# Patient Record
Sex: Male | Born: 1965 | Hispanic: No | Marital: Married | Smoking: Never smoker
Health system: Southern US, Community
[De-identification: ages and names within clinical notes are randomized; demographics above are authoritative.]

## PROBLEM LIST (undated history)

## (undated) DIAGNOSIS — I2510 Atherosclerotic heart disease of native coronary artery without angina pectoris: Secondary | ICD-10-CM

## (undated) DIAGNOSIS — K589 Irritable bowel syndrome without diarrhea: Secondary | ICD-10-CM

## (undated) DIAGNOSIS — Z8739 Personal history of other diseases of the musculoskeletal system and connective tissue: Secondary | ICD-10-CM

## (undated) DIAGNOSIS — K529 Noninfective gastroenteritis and colitis, unspecified: Secondary | ICD-10-CM

## (undated) DIAGNOSIS — A159 Respiratory tuberculosis unspecified: Secondary | ICD-10-CM

## (undated) DIAGNOSIS — F4310 Post-traumatic stress disorder, unspecified: Secondary | ICD-10-CM

## (undated) DIAGNOSIS — IMO0001 Reserved for inherently not codable concepts without codable children: Secondary | ICD-10-CM

## (undated) DIAGNOSIS — M79605 Pain in left leg: Secondary | ICD-10-CM

## (undated) DIAGNOSIS — M1990 Unspecified osteoarthritis, unspecified site: Secondary | ICD-10-CM

## (undated) DIAGNOSIS — Z973 Presence of spectacles and contact lenses: Secondary | ICD-10-CM

## (undated) DIAGNOSIS — G8929 Other chronic pain: Secondary | ICD-10-CM

## (undated) DIAGNOSIS — G43909 Migraine, unspecified, not intractable, without status migrainosus: Secondary | ICD-10-CM

## (undated) HISTORY — DX: Pain in left leg: M79.605

## (undated) HISTORY — PX: HX LAPAROTOMY: SHX154

## (undated) HISTORY — PX: HX HEART CATHETERIZATION: SHX148

## (undated) HISTORY — PX: PB COLONOSCOPY,BIOPSY: 45380

---

## 1995-12-29 ENCOUNTER — Emergency Department (HOSPITAL_COMMUNITY): Payer: Self-pay

## 2007-03-14 ENCOUNTER — Encounter (FREE_STANDING_LABORATORY_FACILITY)
Admit: 2007-03-14 | Discharge: 2007-03-14 | Disposition: A | Discharge status: Home / Self Care | Payer: Self-pay | Attending: Pulmonary Disease | Admitting: Pulmonary Disease

## 2007-12-22 ENCOUNTER — Emergency Department (EMERGENCY_DEPARTMENT_HOSPITAL): Payer: No Typology Code available for payment source

## 2007-12-22 ENCOUNTER — Observation Stay (HOSPITAL_BASED_OUTPATIENT_CLINIC_OR_DEPARTMENT_OTHER): Payer: No Typology Code available for payment source

## 2007-12-22 ENCOUNTER — Observation Stay
Admission: EM | Admit: 2007-12-22 | Discharge: 2007-12-26 | Disposition: A | Discharge status: Home / Self Care | Payer: No Typology Code available for payment source | Source: Emergency Department | Attending: Internal Medicine | Admitting: Internal Medicine

## 2007-12-22 ENCOUNTER — Encounter (HOSPITAL_BASED_OUTPATIENT_CLINIC_OR_DEPARTMENT_OTHER): Payer: No Typology Code available for payment source | Admitting: Internal Medicine

## 2007-12-22 ENCOUNTER — Encounter (HOSPITAL_COMMUNITY): Payer: Self-pay

## 2007-12-22 DIAGNOSIS — E785 Hyperlipidemia, unspecified: Secondary | ICD-10-CM | POA: Insufficient documentation

## 2007-12-22 DIAGNOSIS — R0789 Other chest pain: Principal | ICD-10-CM | POA: Insufficient documentation

## 2007-12-22 DIAGNOSIS — R079 Chest pain, unspecified: Secondary | ICD-10-CM

## 2007-12-22 HISTORY — DX: Respiratory tuberculosis unspecified: A15.9

## 2007-12-22 HISTORY — DX: Noninfective gastroenteritis and colitis, unspecified: K52.9

## 2007-12-22 LAB — CBC/DIFF
BASOPHILS: 0 % (ref 0–1)
BASOS ABS: 0.027 10*3/uL (ref 0.0–0.2)
EOS ABS: 0.079 THOU/uL — ABNORMAL LOW (ref 0.1–0.3)
EOSINOPHIL: 1 % (ref 1–6)
HCT: 42 % (ref 39.8–50.2)
HGB: 15 g/dL (ref 13.1–17.3)
LYMPHOCYTES: 50 % — ABNORMAL HIGH (ref 20–45)
LYMPHS ABS: 3.49 THOU/uL (ref 1.0–4.8)
MCH: 30.3 pg (ref 27.4–33.0)
MCHC: 35.7 g/dL — ABNORMAL HIGH (ref 31.6–35.5)
MCV: 84.8 fL (ref 78–100)
MONOCYTES: 6 % (ref 4–13)
MONOS ABS: 0.44 THOU/uL (ref 0.1–0.9)
MPV: 7.6 FL (ref 7.4–10.4)
PLATELET COUNT: 300 10*3/uL (ref 140–450)
PMN ABS: 3.07 10*3/uL (ref 1.3–7.7)
PMN'S: 43 % (ref 40–75)
RBC: 4.96 MIL/uL (ref 4.46–5.70)
RDW: 11.6 % (ref 11.5–14.5)
WBC: 7.1 THOU/UL (ref 3.5–11.0)

## 2007-12-22 LAB — BASIC METABOLIC PANEL
BUN/CREAT RATIO: 7 (ref 6–22)
CARBON DIOXIDE: 17 mmol/L — ABNORMAL LOW (ref 23–33)
GLUCOSE,NONFAST: 157 mg/dL — AB
SODIUM: 135 mmol/L — ABNORMAL LOW (ref 136–145)

## 2007-12-22 LAB — PT/INR
INR: 1.1 (ref 0.8–1.2)
PROTHROMBIN TIME: 10.8 s (ref 9.1–11.2)

## 2007-12-22 LAB — CREATINE KINASE (CK), TOTAL, SERUM OR PLASMA: CREATINE KINASE (CK): 167 U/L (ref 48–222)

## 2007-12-22 LAB — PTT (PARTIAL THROMBOPLASTIN TIME): APTT: 23.9 s (ref 22.5–32.0)

## 2007-12-22 LAB — CREATINE KINASE (CK), MB FRACTION, SERUM
CK-MB: 0.9 ng/mL (ref <6.4)
MB INDEX: 0.5 % (ref 0.0–5.0)

## 2007-12-22 MED ORDER — ASPIRIN 81 MG CHEWABLE TABLET
CHEWABLE_TABLET | ORAL | Status: AC
Start: 2007-12-22 — End: 2007-12-22
  Filled 2007-12-22: qty 4

## 2007-12-22 MED ORDER — PROMETHAZINE 25 MG TABLET
25.00 mg | ORAL_TABLET | Freq: Four times a day (QID) | ORAL | Status: DC | PRN
Start: 2007-12-22 — End: 2007-12-27
  Administered 2007-12-26: 25 mg via ORAL
  Filled 2007-12-22: qty 1

## 2007-12-22 MED ORDER — ASPIRIN CHEWABLE (81MG X 4) TABLETS - MI PROTOCOL
324.0000 mg | PACK | Freq: Once | Status: AC
Start: 2007-12-22 — End: 2007-12-22
  Administered 2007-12-22: 324 mg via ORAL
  Filled 2007-12-22: qty 1296

## 2007-12-22 MED ORDER — ACETAMINOPHEN 325 MG TABLET
650.00 mg | ORAL_TABLET | Freq: Four times a day (QID) | ORAL | Status: DC | PRN
Start: 2007-12-22 — End: 2007-12-27

## 2007-12-22 MED ORDER — NITROGLYCERIN 0.4 MG SUBLINGUAL TABLET
0.4000 mg | SUBLINGUAL_TABLET | SUBLINGUAL | Status: DC | PRN
Start: 2007-12-22 — End: 2007-12-27
  Administered 2007-12-25: 0.4 mg via SUBLINGUAL
  Filled 2007-12-22: qty 1

## 2007-12-22 MED ORDER — ALUMINUM-MAG HYDROXIDE-SIMETHICONE 400 MG-400 MG-40 MG/5 ML ORAL SUSP
5.00 mL | ORAL | Status: DC | PRN
Start: 2007-12-22 — End: 2007-12-27
  Administered 2007-12-23: 5 mL via ORAL
  Filled 2007-12-22 (×2): qty 5

## 2007-12-22 MED ORDER — IOVERSOL 320 MG IODINE/ML INTRAVENOUS SOLUTION
80.00 mL | INTRAVENOUS | Status: AC
Start: 2007-12-22 — End: 2007-12-22
  Administered 2007-12-22: 80 mL via INTRAVENOUS

## 2007-12-22 MED ORDER — MORPHINE 4 MG/ML INJECTION SYRINGE
4.0000 mg | INJECTION | Freq: Once | INTRAMUSCULAR | Status: AC
Start: 2007-12-22 — End: 2007-12-22
  Administered 2007-12-22: 4 mg via INTRAVENOUS

## 2007-12-22 MED ORDER — NITROGLYCERIN 0.4 MG SUBLINGUAL TABLET
0.40 mg | SUBLINGUAL_TABLET | SUBLINGUAL | Status: AC | PRN
Start: 2007-12-22 — End: 2007-12-22
  Administered 2007-12-22 (×3): 0.4 mg via SUBLINGUAL

## 2007-12-22 MED ORDER — ASPIRIN 325 MG TABLET,DELAYED RELEASE
325.0000 mg | DELAYED_RELEASE_TABLET | Freq: Every day | ORAL | Status: DC
Start: 2007-12-22 — End: 2007-12-27
  Administered 2007-12-22 – 2007-12-24 (×3): 325 mg via ORAL
  Administered 2007-12-25: 0 mg via ORAL
  Administered 2007-12-26: 325 mg via ORAL
  Filled 2007-12-22 (×5): qty 1

## 2007-12-22 MED ORDER — MORPHINE 4 MG/ML INJECTION SYRINGE
INJECTION | INTRAMUSCULAR | Status: AC
Start: 2007-12-22 — End: 2007-12-22
  Filled 2007-12-22: qty 1

## 2007-12-22 MED ORDER — MORPHINE 2 MG/ML INJECTION SYRINGE
2.0000 mg | INJECTION | INTRAMUSCULAR | Status: DC | PRN
Start: 2007-12-22 — End: 2007-12-27
  Administered 2007-12-22 – 2007-12-26 (×9): 2 mg via INTRAVENOUS
  Filled 2007-12-22 (×11): qty 1

## 2007-12-22 MED ORDER — NITROGLYCERIN 0.4 MG SUBLINGUAL TABLET
SUBLINGUAL_TABLET | SUBLINGUAL | Status: AC
Start: 2007-12-22 — End: 2007-12-22
  Filled 2007-12-22: qty 3

## 2007-12-22 MED ORDER — ASPIRIN 81 MG CHEWABLE TABLET
CHEWABLE_TABLET | ORAL | Status: AC
Start: 2007-12-22 — End: 2007-12-22
  Filled 2007-12-22: qty 1

## 2007-12-22 NOTE — ED Notes (Signed)
Bed: 19A<BR> Expected date: <BR> Expected time: <BR> Means of arrival: <BR> Comments:<BR>

## 2007-12-22 NOTE — ED Attending Handoff Note (Signed)
Care at 3:07 PM  Admitted for chest pain.

## 2007-12-22 NOTE — ED Provider Notes (Signed)
HPI        ROS        History:        Physical Exam        ED Course:    Results:     ED Course: Please see attending note for primary note.     Pt received mult ntg with only minimal improvemtn in pain. Pt received mult doses iv morphine for pain with recurrence after the medicine would wear off. Pt 's vital signs remainedstable throughout ed stay. Trop negative. Ekg - nsr, no acute changes in st segment or twave.  Cxr - no acute process. Medicine called for admission for rule out Mi. Ddimer neg.     Evaluation Plan:

## 2007-12-22 NOTE — ED Nurses Note (Signed)
 Patient states that he is feeling some relief after morphine - pain now a 4/10.

## 2007-12-22 NOTE — ED Nurses Note (Signed)
Patient report called to 7 [place] RN

## 2007-12-22 NOTE — ED Attending Note (Signed)
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  The chest is be in the center of the chest and 8 at template pain scale.  It is associated with palpitations and shortness of breath.  Patient denies any nausea.  Patient has no personal history of coronary disease although he does note history of Graves' disease in his uncles.  Cardiac risk factors include borderline diabetes, however the patient is not a smoker and denies any history of hypertension or hyperlipidemia.  Prior to this the patient has been is otherwise state of usual good health.  The patient does note the last time he ran 2 miles approximately 3 months ago and that he has fallen out of the habit of running.    Past medical history: Colitis, borderline diabetes mellitus  allergies: No known diagnosed drug allergies  medications: Nursing list reviewed  social history: The patient denies tobacco alcohol or drug use  family history the patient has no self or familial history of DVT or pulmonary embolus, there is coronary disease noted in his uncles.    PE :   VS on presentation: Blood pressure 124/80, pulse 130, resp. rate 32, height 1.778 m (5\' 10" ), weight 97.523 kg (215 lb), SpO2 98%.  General: Alert, oriented, mild respiratory distress  Skin: No acute lesions.  No petecchiae or purpura. Warm and flushed, mild diaphoresis  HEENT: AT/NC. PERRL. EOMI. B/L TMs WNL. OP clear.  Neck: No JVD or HJR. Supple. No meningismus. No adenopathy.  Lungs: CTA B/L. BS = B/L. No wheezes, rales, or rhonchi.  CV: Regular and tachycardic Normal S1/S2. No murmurs, rubs, gallops, or clicks.  Abdomen: +BS, soft, NT, ND.  No rebound or guarding present.  No masses.  Extrems: 2+ distal pulses of the upper and lower extremities.  Brisk capillary refill.  No deformities. Pink, warm, and dry.   Neuro: CNs II - XII fully intact.  Normal FTN and HTS.  Normal gait and cerebellar function.  Normal speech and comprehension.  2+/4 DTR's bilaterally.  Psych:  Nl affect and mood          Medical Decision Making/ED Course:   I have seen and examined this patient discuss this patient's care with Dr. Italy Albaugh, resident physician.  The patient's EKG was reviewed by me and shows a ventricular rate of 118 beats/min normal axis normal intervals and no ST segment abnormalities or other signs of ischemia.  I was present throughout this patient's history and physical and believe that the patient's symptoms today likely represents over exertion combined with possible component of anxiety.  The patient finished his intense exercise only 10 minutes prior to arrival here and appears to still be in cool off mode.  However given the patient's age he will be given aspirin as well as nitroglycerin during his initial assessment.  I believe the patient is low clinical suspicion for pulmonary embolus given his lack of risk factors, and only mild tachycardia with no hypoxia however d-dimer appears perforated for rule out.  The patient will be observed here in the emergency department to assure that his pulse returns to normal and he will be given IV fluids for rehydration.    Clinical Impression :   Chest pain   Tachycardia      Procedures: None

## 2007-12-23 DIAGNOSIS — R079 Chest pain, unspecified: Secondary | ICD-10-CM

## 2007-12-23 DIAGNOSIS — R7303 Prediabetes: Secondary | ICD-10-CM

## 2007-12-23 HISTORY — DX: Chest pain, unspecified: R07.9

## 2007-12-23 HISTORY — DX: Prediabetes: R73.03

## 2007-12-23 LAB — TROPONIN-I
TROPONIN-I: 0.015 ng/mL (ref <0.050)
TROPONIN-I: 0.011 ng/mL (ref <0.050)

## 2007-12-23 LAB — CREATINE KINASE (CK), MB FRACTION, SERUM
CK-MB: 1.1 ng/mL (ref <6.4)
MB INDEX: 0.5 % (ref 0.0–5.0)

## 2007-12-23 LAB — CREATININE
CREATININE: 0.94 mg/dL (ref 0.62–1.27)
ESTIMATED GLOMERULAR FILTRATION RATE: >59 mL/min/{1.73_m2} (ref >59)

## 2007-12-23 LAB — CREATINE KINASE (CK), TOTAL, SERUM OR PLASMA: CREATINE KINASE (CK): 208 U/L (ref 48–222)

## 2007-12-23 MED ORDER — TRAMADOL 50 MG TABLET
50.0000 mg | ORAL_TABLET | Freq: Four times a day (QID) | ORAL | Status: DC | PRN
Start: 2007-12-23 — End: 2007-12-27
  Administered 2007-12-23 – 2007-12-24 (×2): 50 mg via ORAL
  Filled 2007-12-23 (×7): qty 1

## 2007-12-23 MED ORDER — ESOMEPRAZOLE MAGNESIUM 40 MG CAPSULE,DELAYED RELEASE
40.0000 mg | DELAYED_RELEASE_CAPSULE | Freq: Every morning | ORAL | Status: DC
Start: 2007-12-23 — End: 2007-12-27
  Administered 2007-12-23: 40 mg via ORAL
  Administered 2007-12-24 – 2007-12-25 (×2): 0 mg via ORAL
  Administered 2007-12-26: 40 mg via ORAL
  Filled 2007-12-23 (×5): qty 1

## 2007-12-23 MED ORDER — SODIUM CHLORIDE 0.9 % INTRAVENOUS SOLUTION
INTRAVENOUS | Status: DC
Start: 2007-12-23 — End: 2007-12-27
  Administered 2007-12-25: 0 via INTRAVENOUS

## 2007-12-23 NOTE — Nurses Notes (Signed)
Pt assessed per flowsheet. C/o min amount of chest pain 4/10 ,and at times tingling to fingers to left hand. Will continiue to monitor.

## 2007-12-24 ENCOUNTER — Inpatient Hospital Stay (HOSPITAL_COMMUNITY): Payer: No Typology Code available for payment source

## 2007-12-24 ENCOUNTER — Observation Stay (HOSPITAL_BASED_OUTPATIENT_CLINIC_OR_DEPARTMENT_OTHER): Payer: No Typology Code available for payment source

## 2007-12-24 ENCOUNTER — Observation Stay (HOSPITAL_COMMUNITY): Payer: No Typology Code available for payment source

## 2007-12-24 LAB — BASIC METABOLIC PANEL
BUN/CREAT RATIO: 6 (ref 6–22)
BUN: 5 mg/dL — ABNORMAL LOW (ref 8–26)
GLUCOSE,NONFAST: 108 mg/dL — AB

## 2007-12-24 LAB — CBC/DIFF
BASOPHILS: 0 % (ref 0–1)
BASOS ABS: 0 THOU/uL (ref 0.0–0.2)
EOS ABS: 0.126 THOU/uL (ref 0.1–0.3)
EOSINOPHIL: 2 % (ref 1–6)
HCT: 37.5 % — ABNORMAL LOW (ref 39.8–50.2)
HGB: 13.3 g/dL (ref 13.1–17.3)
LYMPHOCYTES: 65 % — ABNORMAL HIGH (ref 20–45)
LYMPHS ABS: 4.095 THOU/uL (ref 1.0–4.8)
MCH: 30.1 pg (ref 27.4–33.0)
MCHC: 35.5 g/dL (ref 31.6–35.5)
MCV: 84.8 fL (ref 78–100)
MONOCYTES: 7 % (ref 4–13)
MONOS ABS: 0.441 THOU/uL (ref 0.1–0.9)
MPV: 7.3 FL — ABNORMAL LOW (ref 7.4–10.4)
PLATELET COUNT: 252 THO/UL (ref 140–450)
PMN ABS: 1.638 10*3/uL (ref 1.3–7.7)
PMN'S: 26 % — ABNORMAL LOW (ref 40–75)
RBC: 4.43 MIL/uL — ABNORMAL LOW (ref 4.46–5.70)
RDW: 11.6 % (ref 11.5–14.5)
WBC: 6.3 THOU/UL (ref 3.5–11.0)

## 2007-12-24 MED ORDER — DEXTROSE 5 % AND 0.45 % SODIUM CHLORIDE INTRAVENOUS SOLUTION
INTRAVENOUS | Status: DC
Start: 2007-12-25 — End: 2007-12-27

## 2007-12-24 MED ORDER — DIAZEPAM 5 MG TABLET
5.0000 mg | ORAL_TABLET | Freq: Once | ORAL | Status: AC
Start: 2007-12-25 — End: 2007-12-25
  Administered 2007-12-25: 5 mg via ORAL
  Filled 2007-12-24: qty 1

## 2007-12-25 ENCOUNTER — Inpatient Hospital Stay (HOSPITAL_COMMUNITY): Payer: No Typology Code available for payment source

## 2007-12-25 LAB — CBC/DIFF
BASOPHILS: 0 % (ref 0–1)
BASOS ABS: 0 THOU/uL (ref 0.0–0.2)
EOS ABS: 0.248 THOU/uL (ref 0.1–0.3)
EOSINOPHIL: 4 % (ref 1–6)
HCT: 38.8 % — ABNORMAL LOW (ref 39.8–50.2)
HGB: 13.2 g/dL (ref 13.1–17.3)
LYMPHOCYTES: 40 % (ref 20–45)
LYMPHS ABS: 2.48 THOU/uL (ref 1.0–4.8)
MCH: 28.9 pg (ref 27.4–33.0)
MCHC: 34.1 g/dL (ref 31.6–35.5)
MCV: 85 fL (ref 78–100)
MONOCYTES: 5 % (ref 4–13)
MONOS ABS: 0.31 THOU/uL (ref 0.1–0.9)
MPV: 7.7 FL (ref 7.4–10.4)
PLATELET COUNT: 234 THO/UL (ref 140–450)
PMN ABS: 3.162 THOU/uL (ref 1.3–7.7)
PMN'S: 51 % (ref 40–75)
RBC: 4.56 MIL/uL (ref 4.46–5.70)
RDW: 11.6 % (ref 11.5–14.5)
WBC: 6.2 THOU/UL (ref 3.5–11.0)

## 2007-12-25 LAB — LIPID PANEL
CHOLESTEROL: 180 mg/dL (ref <200)
HDL-CHOLESTEROL: 24 mg/dL — ABNORMAL LOW (ref >39)
LDL (CALCULATED): 97 mg/dL (ref <100)
TRIGLYCERIDES: 297 mg/dL — ABNORMAL HIGH (ref <150)

## 2007-12-25 LAB — PT/INR
INR: 1 (ref 0.8–1.2)
PROTHROMBIN TIME: 10.4 s (ref 9.1–11.2)

## 2007-12-25 MED ORDER — FENTANYL (PF) 50 MCG/ML INJECTION SOLUTION
INTRAMUSCULAR | Status: AC
Start: 2007-12-25 — End: 2007-12-25
  Filled 2007-12-25: qty 1

## 2007-12-25 MED ORDER — MIDAZOLAM 1 MG/ML INJECTION SOLUTION
1.00 mg | INTRAMUSCULAR | Status: DC
Start: 2007-12-25 — End: 2007-12-25
  Administered 2007-12-25: 2 mg via INTRAVENOUS

## 2007-12-25 MED ORDER — SODIUM CHLORIDE 0.9 % INTRAVENOUS SOLUTION
INTRAVENOUS | Status: AC
Start: 2007-12-25 — End: 2007-12-25

## 2007-12-25 MED ORDER — FENTANYL (PF) 50 MCG/ML INJECTION SOLUTION
25.00 ug | INTRAMUSCULAR | Status: DC
Start: 2007-12-25 — End: 2007-12-25
  Administered 2007-12-25: 14:00:00 50 ug via INTRAVENOUS

## 2007-12-25 MED ORDER — MIDAZOLAM 1 MG/ML INJECTION SOLUTION
INTRAMUSCULAR | Status: AC
Start: 2007-12-25 — End: 2007-12-25
  Filled 2007-12-25: qty 4

## 2007-12-25 MED ORDER — HEPARIN (PORCINE) 1,000 UNIT/ML INJECTION SOLUTION
INTRAMUSCULAR | Status: AC
Start: 2007-12-25 — End: 2007-12-25
  Filled 2007-12-25: qty 2

## 2007-12-25 MED ORDER — LIDOCAINE HCL 20 MG/ML (2 %) INJECTION SOLUTION
10.0000 mL | Freq: Once | INTRAMUSCULAR | Status: AC
Start: 2007-12-25 — End: 2007-12-25
  Administered 2007-12-25: 200 mg via INTRADERMAL

## 2007-12-25 MED ORDER — IODIXANOL 320 MG IODINE/ML INTRAVENOUS SOLUTION
95.00 mL | INTRAVENOUS | Status: AC
Start: 2007-12-25 — End: 2007-12-25
  Administered 2007-12-25: 125 mL via INTRAVENOUS

## 2007-12-25 NOTE — Nurses Notes (Signed)
Pt to room via bed.R groin dressing CDI PPP sr up x 2

## 2007-12-25 NOTE — Nurses Notes (Signed)
1515-floor called for transport.  1545-floor called for transport. Report to nurse.

## 2007-12-25 NOTE — Nurses Notes (Signed)
1615-floor notified that Pt still not transported to floor.

## 2007-12-25 NOTE — Nurses Notes (Signed)
Pt c/o left chest pain 0.4mg  Nitroglycerin given sl, bp 126/88, pulse 62,  5 minutes after med pt states chest pain is "better" refuses further pain meds or nitroglycerin, will continue to monitor

## 2007-12-25 NOTE — Nurses Notes (Signed)
Pt to [facility] to await transport back to room. AAO x 3 ,vswnl,R groin dressing CDI,PPP . Post instr. To Pt and wife at bedside. SR up x 2

## 2007-12-25 NOTE — Nurses Notes (Signed)
Pt complains of tingling on toes. Dr. Alessandra Bevels contacted. No new orders given. Will continue to monitor.

## 2007-12-26 DIAGNOSIS — E785 Hyperlipidemia, unspecified: Secondary | ICD-10-CM

## 2007-12-26 HISTORY — DX: Hyperlipidemia, unspecified: E78.5

## 2007-12-26 MED ORDER — SIMVASTATIN 20 MG TABLET
20.00 mg | ORAL_TABLET | Freq: Every evening | ORAL | Status: DC
Start: 2007-12-26 — End: 2007-12-27
  Filled 2007-12-26: qty 1

## 2007-12-26 MED ORDER — DOCUSATE SODIUM 100 MG CAPSULE
100.00 mg | ORAL_CAPSULE | Freq: Two times a day (BID) | ORAL | Status: DC
Start: 2007-12-26 — End: 2007-12-27
  Administered 2007-12-26: 100 mg via ORAL
  Filled 2007-12-26 (×2): qty 1

## 2007-12-26 MED ORDER — NAPROXEN 250 MG TABLET
250.0000 mg | ORAL_TABLET | Freq: Three times a day (TID) | ORAL | Status: DC | PRN
Start: 2007-12-26 — End: 2007-12-27
  Filled 2007-12-26: qty 1

## 2007-12-26 MED ORDER — SIMVASTATIN 20 MG TABLET
20.00 mg | ORAL_TABLET | Freq: Every evening | ORAL | Status: DC
Start: 2007-12-26 — End: 2010-06-03

## 2007-12-26 MED ORDER — ESOMEPRAZOLE MAGNESIUM 40 MG CAPSULE,DELAYED RELEASE
40.00 mg | DELAYED_RELEASE_CAPSULE | Freq: Every morning | ORAL | Status: DC
Start: 2007-12-26 — End: 2010-06-03

## 2007-12-26 MED ORDER — NAPROXEN 250 MG TABLET
250.00 mg | ORAL_TABLET | Freq: Three times a day (TID) | ORAL | Status: DC | PRN
Start: 2007-12-26 — End: 2010-09-25

## 2007-12-26 MED ORDER — DOCUSATE SODIUM 100 MG CAPSULE
100.0000 mg | ORAL_CAPSULE | Freq: Two times a day (BID) | ORAL | Status: DC
Start: 2007-12-26 — End: 2007-12-26

## 2007-12-26 NOTE — Discharge Instructions (Addendum)
Pt to follow low fat diabetic diet.  Pt may remove dressing in 24hrs  See medication information handouts.  See Non-Cardiac Chest Pain handoout.

## 2007-12-26 NOTE — Discharge Summary (Signed)
[facility]   DEPARTMENT OF MEDICINE    DISCHARGE SUMMARY    PATIENT NAME: Spencer [name], Spencer [name]  HOSPITAL ZOXWRU:[id number]  DATE OF BIRTH: 12-21-1965    ADMISSION DATE:12/22/2007  DISCHARGE DATE:12/26/2007    FOLLOWUP AND PRIMARY CARE PHYSICIAN: Bunnie Domino MD.    DISCHARGE DIAGNOSES:  1. Atypical chest pain.  2. Hyperlipidemia.  3. Borderline diabetes mellitus.    CODE STATUS: Full.    DISCHARGE MEDICATIONS:  1. Simvastatin 20 mg 1 tablet p.o. nightly.  2. Ultram 50 mg 1 tablet p.o. q.6 hours p.r.n. for pain.    DISCHARGE INSTRUCTIONS:  A. Disposition: The patient is to follow up with his primary care physician in approximately 1-2 weeks to follow up with hyperlipidemia, atypical chest pain and his borderline diabetes mellitus.   B. Activity: Routine.  C. Diet: Regular.    REASON FOR HOSPITALIZATION: This is a 42 year old male who complains of a 2-day history of chest pain of the left chest wall.     HOSPITAL COURSE: Upon admission to [facility], the patient was worked up for routine chest pain including CBC, electrolytes, BUN, creatinine, cardiac enzymes, EKG and chest x-ray, cardiac enzymes did not indicate any acute cardiac disease at that time. EKG at that time also was not indicative of any acute myocardial infarction or ischemia. Chest x-ray did not indicate any specific findings at that time. The patient was sent for myocardial perfusion scan. At that time, it indicated that the study was actually limited but probably normal exam due to patient motion. There was a small stress-induced ischemia in the anterior wall, but could not be completely excluded. Overall, that was unremarkable cardiac function analysis with a calculated left ventricular ejection fraction of 65%. At that time, cardiology was consulted for the small area of ischemia in the anterior wall, this could not be excluded. The patient had consented to a cardiac catheterization, knew the risks, benefits and alternatives. Cardiac catheterization was performed in the afternoon of December 24, 2006. At that time indicated there was a normal cardiac function without any specific stenosis. The patient tolerated the procedure well. On December 26, 2007, the patient was discharged. The patient did not complain of any significant chest pain, shortness of breath, nausea, vomiting or diarrhea at that time. The patient is to follow up with his primary care physician for atypical chest pain, hyperlipidemia and his borderline diabetes. He will be started on simvastatin 20 mg 1 tablet p.o. nightly and also Ultram 50 mg 1 tablet p.o. q.6 hours p.r.n. for pain.    CONDITION ON DISCHARGE:  A. Ambulation: Independent.  B. Self-care ability: Full.  C. Cognitive status: He is alert and oriented x3.      Elinor Dodge, MD  Resident/[facility]    Bunnie Domino, MD  Assistant Professor  [facility]     [id number]/[id number];D: 12/26/2007 10:07:21; T: 12/26/2007 21:35:06    cc: Bunnie Domino MD   Shirleen Schirmer

## 2007-12-26 NOTE — Nurses Notes (Signed)
Pt given D/C information with follow up material and perscriptions.  Periphial IVs removed catheters intact.

## 2007-12-26 NOTE — Procedures (Signed)
[facility]   SECTION OF CARDIOLOGY   CARDIAC DIAGNOSTIC LABORATORIES   [phone]    Department of Medicine      School of Medicine  Section of Cardiology      Medical Center     CARDIAC CATHETERIZATION PROCEDURE REPORT    PATIENT NAME: Spencer [name], Spencer [name]   HOSPITAL ZOXWRU:[id number]  DATE OF BIRTH: [birth year]  PROCEDURE DATE: 12/25/2007    NAME OF PROCEDURES:  1. Left heart catheterization.  2. Selective coronary angiography.  3. Left ventricular angiogram.  4. Angio-Seal closure to the right common femoral artery.    INDICATIONS FOR PROCEDURE: The patient is a 42 year old man with a history of borderline diabetes who presented to the hospital with the complaint of chest pain. The chest pain lasted approximately 15 minutes. At the first sensation of chest pain, he underwent a myocardial perfusion scan which was compromised by motion artifact on stress. There was questionable anterior and inferior ischemia. He did have symptoms of chest pain on the treadmill. He had ruled out for MI and the rest of his blood work was unremarkable. Due to the patient having an equivocal myocardial perfusion scan, he was referred for left heart catheterization. His cardiac risk factors for coronary artery disease are borderline diabetic, family history of premature coronary artery disease.     DESCRIPTION OF PROCEDURE: After informed consent was obtained, the patient was brought to the cardiac catheterization laboratory where he was prepped and draped in the usual sterile fashion. Incremental doses of both Versed and fentanyl were used to achieve adequate levels of patient sedation. For local anesthesia, approximately 15 mL of 2% lidocaine were infiltrated into the right inguinal area above the right common femoral artery. Using the modified Seldinger technique, a 6-French femoral arterial sheath was introduced into the right common femoral artery. Under direct fluoroscopic guidance, a 6-French JL4 diagnostic coronary catheter was advanced to the level of the left main coronary ostium. The left coronary anatomy was then viewed in multiple angles. This catheter was exchanged over a wire for a 6-French JR4 diagnostic coronary catheter which was advanced under direct fluoroscopic guidance up to the level of the right coronary ostium. The right coronary anatomy was then viewed in multiple angles. This catheter was exchanged over a wire for a 6-French pigtail catheter which was advanced under direct fluoroscopic guidance up to the level of the left ventricle. A left ventricular angiogram was performed using 30 mL of contrast at 10 mL per second at 600 psi. The 6-French pigtail catheter was used for adequate pressure measurements in the left ventricle and upon pullback across the aortic valve. The patient tolerated the procedure well with no complications. He received a total of 110 mL of Optiray dye under 3.1 minutes of total fluoroscopy time. He received intracoronary nitroglycerin during the procedure, 150 mcg. He underwent a femoral angiogram and was deemed an appropriate candidate for Angio-Seal closure.    FINDINGS:    HEMODYNAMICS: Left ventricular end-diastolic pressure was approximately 18-20 mmHg. There was no gradient upon pullback across the aortic valve.     LEFT VENTRICULAR FUNCTION STUDY: The left ventricular angiogram displayed an ejection fraction of approximately 60% with no wall motion abnormalities and no mitral regurgitation noted.    CORONARY ANGIOGRAPHY    LEFT MAIN CORONARY ARTERY: Was normal.    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 Diagonal #1 was small and normal. Diagonal #2 was normal.    LEFT CIRCUMFLEX CORONARY ARTERY: Bifurcates and there was a large obtuse marginal branch #1 which was normal. There was a small obtuse marginal branch which was normal.    RIGHT CORONARY ARTERY: A large, dominant tortuous vessel and was normal.    IMPRESSION:  1. Essentially normal coronary angiogram as above.  2. Normal ventricular systolic function.    RECOMMENDATIONS:  1. Continue aggressive risk factor modification.  2. Noncardiac chest pain.  3. False positive myocardial perfusion scan.  4. Follow up with his primary care physician, Dr. Janeece Fitting in [place], [place], in 1-2 weeks.      Jodi Geralds, MD  Fellow  [facility] Department of Medicine, Section of Cardiology    Carmie End, MD  Assistant Professor, Section of Cardiology  [facility] Department of Medicine    WU/[id number]; D: 12/25/2007 16:33:54; T: 12/26/2007 12:48:35    cc: [name] DO   [facility] [address]   [place], [place] [address]      Bunnie Domino MD   Shirleen Schirmer

## 2007-12-30 DIAGNOSIS — I639 Cerebral infarction, unspecified: Secondary | ICD-10-CM

## 2007-12-30 HISTORY — DX: Cerebral infarction, unspecified (CMS HCC): I63.9

## 2008-05-19 ENCOUNTER — Ambulatory Visit (INDEPENDENT_AMBULATORY_CARE_PROVIDER_SITE_OTHER): Payer: No Typology Code available for payment source | Admitting: Neurology

## 2008-05-19 ENCOUNTER — Ambulatory Visit
Admission: RE | Admit: 2008-05-19 | Discharge: 2008-05-19 | Disposition: A | Discharge status: Home / Self Care | Payer: No Typology Code available for payment source | Attending: Neurology | Admitting: Neurology

## 2008-05-19 ENCOUNTER — Other Ambulatory Visit (INDEPENDENT_AMBULATORY_CARE_PROVIDER_SITE_OTHER): Payer: Self-pay | Admitting: Neurology

## 2008-05-19 DIAGNOSIS — R404 Transient alteration of awareness: Secondary | ICD-10-CM

## 2008-05-19 DIAGNOSIS — IMO0001 Reserved for inherently not codable concepts without codable children: Secondary | ICD-10-CM

## 2008-05-19 DIAGNOSIS — R569 Unspecified convulsions: Secondary | ICD-10-CM | POA: Insufficient documentation

## 2008-05-19 DIAGNOSIS — R079 Chest pain, unspecified: Secondary | ICD-10-CM

## 2008-05-19 DIAGNOSIS — G43909 Migraine, unspecified, not intractable, without status migrainosus: Secondary | ICD-10-CM

## 2008-05-19 DIAGNOSIS — R51 Headache: Secondary | ICD-10-CM

## 2008-05-20 ENCOUNTER — Other Ambulatory Visit (INDEPENDENT_AMBULATORY_CARE_PROVIDER_SITE_OTHER): Payer: Self-pay | Admitting: Neurology

## 2008-05-21 NOTE — Procedures (Signed)
[facility]                                ELECTROENCEPHALOGRAM REPORT                                EEG\EMG Scheduling [phone]                                 STATUS: [place]      NAMEMONTAGUE, CORELLA   UJWJ#:[id number]  DATE: 05/19/2008  DOB :  1966/02/12  SEX:M                  Technician:  BB.  EEG#:  [id number].    IMPRESSION:  This is a normal awake and asleep EEG.      REPORT:  This is an outpatient digital EEG obtained following the standard 10-20 system of electrode placement.  The study lasted for a total of 61 minutes.  The best background seen during the study was 10 Hz, which was symmetric on both sides and was responsive to eye opening.  Both awake and asleep states were recorded.  Sleep was noted by presence of sleep spindles and vertex waves.  No epileptiform abnormalities were seen.  There were no electrographic seizures.  Photic stimulation and hyperventilation both were performed and did not reveal any abnormalities.     INTERPRETATION:  The above described EEG is normal during the state of wakefulness and sleep.      Rut Chinita Pester, MD  Resident  [facility] Department of Neurology    Lurlean Nanny, MD  Assistant Professor  [facility] Department of Neurology    I have reviewed the EEG and agree with the findings of this report. Any additions, exceptions or clarifications are noted above.      [id number]/HYQ/[id number]; D: 05/20/2008 17:58:19; T: 05/21/2008 00:27:19

## 2008-06-16 ENCOUNTER — Encounter (HOSPITAL_COMMUNITY): Payer: Self-pay | Admitting: Neurology

## 2008-06-16 ENCOUNTER — Ambulatory Visit (INDEPENDENT_AMBULATORY_CARE_PROVIDER_SITE_OTHER)
Admission: RE | Admit: 2008-06-16 | Discharge: 2008-06-16 | Disposition: A | Discharge status: Home / Self Care | Source: Ambulatory Visit | Attending: Neurology | Admitting: Neurology

## 2008-06-16 ENCOUNTER — Encounter (HOSPITAL_COMMUNITY): Admitting: Neurology

## 2008-06-16 ENCOUNTER — Ambulatory Visit (HOSPITAL_BASED_OUTPATIENT_CLINIC_OR_DEPARTMENT_OTHER)
Admission: RE | Admit: 2008-06-16 | Discharge: 2008-06-16 | Disposition: A | Discharge status: Home / Self Care | Source: Ambulatory Visit | Attending: Neurology | Admitting: Neurology

## 2008-06-16 ENCOUNTER — Inpatient Hospital Stay
Admission: RE | Admit: 2008-06-16 | Discharge: 2008-06-19 | DRG: 563 | Disposition: A | Discharge status: Home / Self Care | Attending: Neurology | Admitting: Neurology

## 2008-06-16 DIAGNOSIS — R259 Unspecified abnormal involuntary movements: Secondary | ICD-10-CM

## 2008-06-16 DIAGNOSIS — R404 Transient alteration of awareness: Secondary | ICD-10-CM

## 2008-06-16 DIAGNOSIS — R51 Headache: Secondary | ICD-10-CM

## 2008-06-16 DIAGNOSIS — E785 Hyperlipidemia, unspecified: Secondary | ICD-10-CM | POA: Diagnosis present

## 2008-06-16 DIAGNOSIS — IMO0001 Reserved for inherently not codable concepts without codable children: Secondary | ICD-10-CM

## 2008-06-16 DIAGNOSIS — I10 Essential (primary) hypertension: Secondary | ICD-10-CM | POA: Diagnosis present

## 2008-06-16 DIAGNOSIS — R569 Unspecified convulsions: Principal | ICD-10-CM | POA: Diagnosis present

## 2008-06-16 HISTORY — DX: Reserved for inherently not codable concepts without codable children: IMO0001

## 2008-06-16 MED ORDER — SENNOSIDES 8.6 MG-DOCUSATE SODIUM 50 MG TABLET
1.0000 | ORAL_TABLET | Freq: Every evening | ORAL | Status: DC | PRN
Start: 2008-06-16 — End: 2008-06-20
  Filled 2008-06-16 (×4): qty 1

## 2008-06-16 MED ORDER — SIMVASTATIN 20 MG TABLET
20.00 mg | ORAL_TABLET | Freq: Every evening | ORAL | Status: DC
Start: 2008-06-16 — End: 2008-06-20
  Administered 2008-06-16 – 2008-06-18 (×3): 20 mg via ORAL
  Filled 2008-06-16 (×4): qty 1

## 2008-06-16 MED ORDER — ESOMEPRAZOLE MAGNESIUM 40 MG CAPSULE,DELAYED RELEASE
40.00 mg | DELAYED_RELEASE_CAPSULE | Freq: Every morning | ORAL | Status: DC
Start: 2008-06-16 — End: 2008-06-20
  Administered 2008-06-16: 0 mg via ORAL
  Administered 2008-06-17 – 2008-06-19 (×3): 40 mg via ORAL
  Filled 2008-06-16 (×4): qty 1

## 2008-06-16 MED ORDER — SODIUM CHLORIDE 0.9 % (FLUSH) INJECTION SYRINGE
0.5000 mL | INJECTION | Freq: Three times a day (TID) | INTRAMUSCULAR | Status: DC
Start: 2008-06-16 — End: 2008-06-20
  Administered 2008-06-16 – 2008-06-19 (×9): 0.5 mL via INTRAVENOUS

## 2008-06-16 MED ORDER — NAPROXEN 250 MG TABLET
250.00 mg | ORAL_TABLET | Freq: Three times a day (TID) | ORAL | Status: DC | PRN
Start: 2008-06-16 — End: 2008-06-20
  Filled 2008-06-16 (×4): qty 1

## 2008-06-16 MED ORDER — GADOBENATE DIMEGLUMINE 529 MG/ML(0.1 MMOL/0.2 ML) INTRAVENOUS SOLUTION
20.00 mL | INTRAVENOUS | Status: AC
Start: 2008-06-16 — End: 2008-06-16
  Administered 2008-06-16: 20 mL via INTRAVENOUS

## 2008-06-16 MED ORDER — IOVERSOL 350 MG IODINE/ML INTRAVENOUS SOLUTION
80.00 mL | INTRAVENOUS | Status: AC
Start: 2008-06-16 — End: 2008-06-16
  Administered 2008-06-16: 80 mL via INTRAVENOUS

## 2008-06-16 NOTE — Nurses Notes (Signed)
 Pt arrived ambulatory from Radiology to Rm. 968 to be observed via video EEG monitoring. His only event was observed 3 months ago. He had an esophgeal manometry catheter placed and went home. Lying on the couch with his wife, she woke up as she observed him being asleep but his body was shaking for 8-10 seconds. She awakened him who was unaware he had been shaking and then he just went back to sleep. He also blacks out and will profusely sweat when having a bm. Has 86yr hx of migraines.

## 2008-06-17 DIAGNOSIS — R569 Unspecified convulsions: Secondary | ICD-10-CM

## 2008-06-17 MED ORDER — ACETAMINOPHEN 325 MG TABLET
650.0000 mg | ORAL_TABLET | ORAL | Status: DC | PRN
Start: 2008-06-17 — End: 2008-06-20
  Administered 2008-06-18 – 2008-06-19 (×2): 650 mg via ORAL
  Filled 2008-06-17 (×2): qty 2

## 2008-06-18 DIAGNOSIS — R51 Headache: Secondary | ICD-10-CM

## 2008-06-18 NOTE — Procedures (Signed)
[facility]                                ELECTROENCEPHALOGRAM REPORT                                EEG\EMG Scheduling [phone]                                 STATUS: I      NAMESHARRON, Spencer [name]   UYQI#:[id number]  DATE: 06/16/2008  DOB :  1966/07/21  SEX:M                  Video EEG#:  [id number], day #1   REQUESTING PHYSICIAN:  Lurlean Nanny MD.    INDICATION:  staring spells    DESCRIPTION:  A 24-hour video EEG recording with digital analysis was performed in this 42 year old male.  Low amplitude, high frequency background was noted throughout the recording with a rhythm of 10 HZ. Awake stage as well as all stages of sleep were identified including vertex waves, sleep spindles and K complexes.  No spike and wave abnormalities were detected and no pushbutton events occurred.    IMPRESSION:  This is a normal 24-hour video EEG recording with digital analysis performed.      Uvaldo Bristle, MD  Resident  [facility] of Medicine    Lurlean Nanny, MD  Assistant Professor  [facility] Department of Neurology    I have reviewed the EEG and agree with the findings of this report. Any additions, exceptions or clarifications are noted above.      EP/[id number]/[id number]; D: 06/18/2008 08:51:27; T: 06/18/2008 15:13:15    cc: Lurlean Nanny MD      Shirleen Schirmer

## 2008-06-18 NOTE — Nurses Notes (Signed)
Pt sleeping at 0400.  Appears comfortable.  No vitals taken nor assessment performed per kardex order.

## 2008-06-18 NOTE — Procedures (Signed)
[facility]                                ELECTROENCEPHALOGRAM REPORT                                EEG\EMG Scheduling [phone]                                 STATUS: I      NAMEEDOUARD, Spencer [name]   UUVO#:[id number]  DATE: 06/17/2008  DOB :  16-May-1966  SEX:M                  Technician:    Video EEG #:  [id number] day #2      REQUESTING PHYSICIAN:  Lurlean Nanny MD.    INDICATIONS:  Staring spells.    DESCRIPTION:  This is a 24-hour video EEG recording day 2 with a background rhythm of 10 Hz.  Sleep as well as awake state was identified.  All stages of sleep were identified.  Low-amplitude and high-frequency waves are noted.  No epileptiform discharges were identified.  No spike and wave discharges were noted.  No pushbutton events occurred during this recording.    IMPRESSION:  This is a normal 24-hour video EEG recording with digital analysis.  No abnormalities were detected.      Uvaldo Bristle, MD  Resident  [facility] of Medicine    Lurlean Nanny, MD  Assistant Professor  [facility] Department of Neurology    I have reviewed the EEG and agree with the findings of this report. Any additions, exceptions or clarifications are noted above.      [id number]; D: 06/18/2008 51:88:41; T: 06/18/2008 22:34:00

## 2008-06-19 MED ORDER — METOPROLOL TARTRATE 25 MG TABLET
25.0000 mg | ORAL_TABLET | Freq: Two times a day (BID) | ORAL | Status: DC
Start: 2008-06-19 — End: 2008-06-20
  Filled 2008-06-19 (×2): qty 1

## 2008-06-19 NOTE — Nurses Notes (Signed)
Patient sleeping at this time. No VS or nursing assessment completed per orders.

## 2008-06-19 NOTE — Procedures (Signed)
[facility]                                ELECTROENCEPHALOGRAM REPORT                                EEG\EMG Scheduling [phone]                                 STATUS: I      NAMECLEE, PANDIT   KGMW#:[id number]  DATE: 06/19/2008  DOB :  04/14/1966  SEX:M                  Technician:   Video EEG #:  [id number].  This is day #3.    Total recording time for this vEEG was from June 16, 2008, at 4:15 p.m. to June 19, 2008, at 12:23 p.m.    INTERICTAL ABNORMALITIES:  No interictal abnormalities were seen.  The background rhythms remained the same at 10-11 Hz activity seen on both sides of the head.  Normal sleep architecture seen throughout the record.  Digital analysis for epileptiform spikes detection was performed but no abnormalities were seen.     CLINICAL EVENTS:  No pushbutton events.    No clinical events and no EEG abnormalities to suggest ongoing seizures or subclinical seizures were seen.    INTERPRETATION:  This is a normal video EEG.      Lurlean Nanny, MD  Assistant Professor  [facility] Department of Neurology    YQ/[id number]; D: 06/19/2008 18:03:10; T: 06/19/2008 18:17:52

## 2008-06-19 NOTE — Nurses Notes (Signed)
Patient sleeping at this time. No VS or nursing assessment performed per orders. Will monitor patient status via video.

## 2008-06-20 NOTE — Discharge Summary (Signed)
[facility]                                DEPARTMENT OF NEUROLOGY                                     DISCHARGE SUMMARY    PATIENT NAME: Spencer [name], Spencer [name]  HOSPITAL HYQMVH:[id number]  DATE OF BIRTH: [birth year]    ADMISSION DATE:06/16/2008  DISCHARGE DATE:06/19/2008    FOLLOWUP PHYSICIAN:  Lurlean Nanny MD.    DISCHARGE DIAGNOSES:  1.  Spells of unclear etiology.  2.  Headaches.  3.  Lower back pain.  4.  Hyperlipidemia.  5.  Reflux.  6.  Hypertension.    DISCHARGE MEDICATIONS:  1.  Nexium 40 mg daily.  2.  Zocor daily.  3.  Excedrin p.r.n.  4.  Metoprolol 25 mg b.i.d.    ALLERGIES:  No known drug allergies.    DISCHARGE INSTRUCTIONS:  A.  Disposition:  Home.  The patient will follow up with Dr. Allene Dillon in 4 weeks' time.  B.  Activity:  As tolerated.  The patient is to refrain from driving until he has had followup with Dr. Allene Dillon.  He is also to refrain from activities that put him at risk if he is to lose consciousness.  C.  Diet:  Cardiac    HOSPITAL COURSE:  This is a 42 year-old male with spells of loss of consciousness that sound somewhat vasovagal in nature in the daytime as well as 10-20-second shaking episodes while sleeping.  He underwent an MRI and a CTA prior to admission, and these were negative.  He was admitted to the epilepsy monitoring unit for evaluation of the spells.  He did not have any pushbutton events.  No spells were recorded.  There are no epileptic potentials seen; however, the final reports are pending on the video EEG.  He was discharged to home in stable condition and given driving restrictions until followup with Dr. Allene Dillon.    CONDITION ON DISCHARGE:  Stable.      Lanell Persons, MD  Resident/[place] Department of Neurology    Lurlean Nanny, MD  Assistant Professor  [facility] Department of Neurology     I saw and evaluated the patient.  I reviewed the resident's note.  I agree with the findings and plan of care as documented in the resident's note.  Any exceptions/addions are noted below.      WU/[id number]; D: 06/19/2008 14:03:32; T: 06/20/2008 08:21:54    cc: Lurlean Nanny MD      Marcy Salvo DO      [facility] [address]      [place], [place] [address]

## 2008-06-23 NOTE — Procedures (Signed)
[facility]                                ELECTROENCEPHALOGRAM REPORT                                EEG\EMG Scheduling [phone]                                 STATUS: I      NAMEKYRO, Spencer [name]   NGEX#:[id number]  DATE: 06/19/2008  DOB :  August 28, 1966  SEX:M                  Video EEG#:  [id number], day 4.     INTERICTAL ABNORMALITIES:  No interictal abnormalities were seen.  The background rhythms remained the same at 10-11 Hz activity seen on both sides of the head.  Normal sleep architecture seen throughout the record.  Digital analysis for epileptiform spikes detection was performed but no abnormalities were seen.     P[place]UTTON EVENTS:  None.      INTERPRETATION:  This is a normal video EEG.      Total recording time for this EMU admission was from June 16, 2008, at 4:15 p.m. to June 19, 2008, at 12:23 p.m.      Lurlean Nanny, MD  Assistant Professor  [facility] Department of Neurology    [id number]; D: 06/20/2008 15:47:51; T: 06/23/2008 07:22:50

## 2008-06-30 ENCOUNTER — Encounter (INDEPENDENT_AMBULATORY_CARE_PROVIDER_SITE_OTHER): Payer: Self-pay | Admitting: Neurology

## 2008-07-08 ENCOUNTER — Encounter (INDEPENDENT_AMBULATORY_CARE_PROVIDER_SITE_OTHER): Admitting: Neurology

## 2008-07-09 ENCOUNTER — Encounter (INDEPENDENT_AMBULATORY_CARE_PROVIDER_SITE_OTHER): Admitting: Neurology

## 2009-10-09 LAB — LIPID PANEL
CHOLESTEROL: 213 — ABNORMAL HIGH
HDL-CHOLESTEROL: 35
LDL (CALCULATED): 134
TRIGLYCERIDES: 218 — ABNORMAL HIGH
VLDL (CALCULATED): 43.6 — ABNORMAL HIGH

## 2009-10-09 LAB — THYROID STIMULATING HORMONE (SENSITIVE TSH): TSH: 2.39

## 2009-10-09 LAB — THYROXINE, FREE (FREE T4): T4, FREE (THYROXINE): 0.72

## 2010-05-28 DIAGNOSIS — R9439 Abnormal result of other cardiovascular function study: Secondary | ICD-10-CM

## 2010-05-28 DIAGNOSIS — Z9889 Other specified postprocedural states: Secondary | ICD-10-CM

## 2010-05-28 HISTORY — DX: Abnormal result of other cardiovascular function study: R94.39

## 2010-05-28 HISTORY — DX: Other specified postprocedural states: Z98.890

## 2010-06-03 ENCOUNTER — Ambulatory Visit (INDEPENDENT_AMBULATORY_CARE_PROVIDER_SITE_OTHER): Admitting: Internal Medicine

## 2010-06-03 ENCOUNTER — Encounter (INDEPENDENT_AMBULATORY_CARE_PROVIDER_SITE_OTHER): Payer: Self-pay | Admitting: Internal Medicine

## 2010-06-03 VITALS — BP 122/82 | HR 81 | Resp 20 | Ht 71.0 in | Wt 216.0 lb

## 2010-06-03 DIAGNOSIS — I2510 Atherosclerotic heart disease of native coronary artery without angina pectoris: Secondary | ICD-10-CM

## 2010-06-03 DIAGNOSIS — I209 Angina pectoris, unspecified: Secondary | ICD-10-CM

## 2010-06-03 DIAGNOSIS — E781 Pure hyperglyceridemia: Secondary | ICD-10-CM

## 2010-06-03 DIAGNOSIS — I739 Peripheral vascular disease, unspecified: Secondary | ICD-10-CM

## 2010-06-03 HISTORY — DX: Pure hyperglyceridemia: E78.1

## 2010-06-03 HISTORY — DX: Peripheral vascular disease, unspecified (CMS HCC): I73.9

## 2010-06-03 HISTORY — DX: Atherosclerotic heart disease of native coronary artery without angina pectoris: I25.10

## 2010-06-03 HISTORY — DX: Angina pectoris, unspecified (CMS HCC): I20.9

## 2010-06-03 MED ORDER — METOPROLOL TARTRATE 25 MG TABLET
25.00 mg | ORAL_TABLET | Freq: Two times a day (BID) | ORAL | Status: DC
Start: 2010-06-03 — End: 2010-12-01

## 2010-06-03 MED ORDER — NITROGLYCERIN 0.4 MG SUBLINGUAL TABLET
SUBLINGUAL_TABLET | SUBLINGUAL | Status: DC
Start: 2010-06-03 — End: 2011-05-10

## 2010-06-03 MED ORDER — SIMVASTATIN 20 MG TABLET
20.0000 mg | ORAL_TABLET | Freq: Every evening | ORAL | Status: DC
Start: 2010-06-03 — End: 2010-12-01

## 2010-06-03 NOTE — Procedures (Signed)
Performed in clinic

## 2010-06-14 ENCOUNTER — Encounter (INDEPENDENT_AMBULATORY_CARE_PROVIDER_SITE_OTHER): Payer: Self-pay | Admitting: Internal Medicine

## 2010-06-14 DIAGNOSIS — K5090 Crohn's disease, unspecified, without complications: Secondary | ICD-10-CM

## 2010-06-14 DIAGNOSIS — K589 Irritable bowel syndrome without diarrhea: Secondary | ICD-10-CM | POA: Insufficient documentation

## 2010-06-14 DIAGNOSIS — K219 Gastro-esophageal reflux disease without esophagitis: Secondary | ICD-10-CM

## 2010-06-14 HISTORY — DX: Crohn's disease, unspecified, without complications (CMS HCC): K50.90

## 2010-06-14 HISTORY — DX: Irritable bowel syndrome without diarrhea: K58.9

## 2010-06-14 HISTORY — DX: Gastro-esophageal reflux disease without esophagitis: K21.9

## 2010-06-15 ENCOUNTER — Encounter (INDEPENDENT_AMBULATORY_CARE_PROVIDER_SITE_OTHER): Payer: Self-pay | Admitting: Internal Medicine

## 2010-06-15 NOTE — Progress Notes (Signed)
Entered labs from [facility] on 10/09/09.  "SEE LABS"

## 2010-06-26 NOTE — H&P (Signed)
[facility]  [address]  [place], [place] [address]    HISTORY AND PHYSICAL    PATIENT NAME: Spencer [name], Spencer [name]  CHART NUMBER: [id number]  DATE OF BIRTH: Aug 28, 1966  DATE OF SERVICE: 06/03/2010    REASON FOR REFERRAL:  For evaluation of chest pain.    SUBJECTIVE AND HISTORY OF PRESENT ILLNESS:  Mr. Mcdanel is a 44 year old male who was sent to [place] by Dr. Janeece Fitting for evaluation of chest pain.  The patient claims that one to two times a week he gets this chest pain, which he describes as a dull and stabbing pain that goes into his neck and shoulder.  He also gets lightheaded, short of breath and nauseated with it, sometimes diaphoretic.  These episodes can be at rest or with exertion.  The patient was seen in the [place] ER recently and was ruled out for myocardial event with serial enzymes and EKG.  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  Otherwise, his other coronaries were normal.  His EF was 60%.  The patient underwent stress testing in November 2010, which showed a normal MPS with an EF of 69%.  The patient claims that for some time after his catheterization, he was on metoprolol.  He took that and it seemed to help with his symptoms of chest pain.  However, when that prescription ran out, he did not have any further filled.  The patient has multiple cardiac risk factors including hyperlipidemia, hypertension, diet controlled diabetes mellitus and history of PAD.  The patient claims that he has taken Zocor in the past.  However, he has not taken that for some time.  He has no other complaints at this time.    PAST MEDICAL HISTORY:  1.  Hyperlipidemia.  2.  GERD.  3.  History of Crohn's disease.  4.  History of irritable bowel syndrome.  5.  Diet controlled diabetes mellitus.  6.  History of PAD.     MEDICATIONS:  The patient's home medications include:  1.  Aspirin 325 mg daily.  2.  Doxepin 50 mg nightly.  3.  Nexium 40 mg every other day.  4.  Naprosyn 250 mg every eight hours as needed.    ALLERGIES:  The patient has no known drug allergies.    PAST SURGICAL HISTORY:  Significant for laparotomy.    SOCIAL HISTORY:  The patient denies tobacco and alcohol use.  He is a rehabilitation therapy assistant and was previously in the [employer]	[place], works at the [employer].    FAMILY HISTORY:  Negative for premature coronary artery disease or sudden cardiac death.    REVIEW OF SYSTEMS:  General:  No fatigue, malaise, fevers, chills or rigors.  Eyes:  No change in vision.  ENT:  No sinus congestion or pain.  Respiratory:  No cough, sputum production or shortness of breath.  Cardiovascular:  Positive chest pain as described above.  No palpitations.  GI:  No nausea, vomiting, diarrhea or abdominal pain.  GU:  No dysuria.  All other systems are negative.    OBJECTIVE AND PHYSICAL EXAMINATION:  Vital signs:  Height 5 feet 11 inches, weight 216 pounds, pulse 81, respirations 20, blood pressure 122/82.  O2 saturations are 97% on room air.  General:  Pleasant 44 year old male who is awake, alert, oriented and in  no acute distress.  His eyes are anicteric.  Mouth:  Mucosa pink without ulceration or erythema.  Lungs:  Clear to auscultation bilaterally.  Cardiovascular:  Regular rate and rhythm, no audible murmur.  Abdomen:  Soft, nontender, nondistended, no hepatosplenomegaly detected.  Extremities:  No clubbing, cyanosis or edema.  Neurologic:  Grossly intact.  Skin:  Without rash.    STUDIES:  Lipid panel from 10/09/2009 showed a cholesterol of 213, triglycerides of 218, LDL 134, HDL 35.    EKG was performed in the clinic and reviewed by Dr. Myer Peer, showed a normal sinus rhythm with a rate of 79.    ASSESSMENT AND PLAN:   1.  Very mild CAD by catheterization with continued anginal type chest pain.  At this time, we will restart the patient on metoprolol 25 mg twice daily, as well as sublingual nitroglycerin as needed.  2.  Hyperlipidemia with hypertriglyceridemia.  Again, the patient claims he was previously on Zocor 20 mg daily; however, has not taken that for some time.  He was given a prescription for Zocor 20 mg to take at bedtime.  We will follow up with the patient in four to six weeks with repeat fasting lipid panel, AST and ALT.  3.  Diet controlled diabetes mellitus.  Continue diet modification.  4.  We will follow up with the patient in four weeks or sooner as needed.    We appreciate the referral of this very pleasant gentleman by Dr. Janeece Fitting for evaluation of chest pain.    This patient was jointly seen and examined with Dr. Lajuan Kovaleski Ao who agrees with the plan of care.      Othelia Pulling, PA-C  [facility]    I have seen and evaluated this patient jointly with Othelia Pulling and agree with her assessment, recommendations and plan. Any changes are noted below.    Lizania Bouchard Ao, MD  Assistant Professor, Section of Cardiology  [facility] Department of Medicine    ZO/[id number]; D: 06/14/2010 11:04:37; T: 06/14/2010 12:04:09    cc: [name] DO      [facility] [address]      [place], [place] [address]

## 2010-07-12 ENCOUNTER — Encounter (INDEPENDENT_AMBULATORY_CARE_PROVIDER_SITE_OTHER): Payer: Self-pay | Admitting: Internal Medicine

## 2010-07-13 ENCOUNTER — Encounter (INDEPENDENT_AMBULATORY_CARE_PROVIDER_SITE_OTHER): Payer: Self-pay | Admitting: Internal Medicine

## 2010-09-21 ENCOUNTER — Encounter (HOSPITAL_COMMUNITY): Payer: Self-pay

## 2010-09-21 ENCOUNTER — Emergency Department (EMERGENCY_DEPARTMENT_HOSPITAL)

## 2010-09-21 ENCOUNTER — Observation Stay
Admission: EM | Admit: 2010-09-21 | Discharge: 2010-09-25 | Disposition: A | Discharge status: Home / Self Care | Attending: Internal Medicine | Admitting: Internal Medicine

## 2010-09-21 ENCOUNTER — Observation Stay (HOSPITAL_BASED_OUTPATIENT_CLINIC_OR_DEPARTMENT_OTHER): Admitting: Internal Medicine

## 2010-09-21 DIAGNOSIS — K529 Noninfective gastroenteritis and colitis, unspecified: Principal | ICD-10-CM | POA: Insufficient documentation

## 2010-09-21 LAB — COMPREHENSIVE METABOLIC PANEL, NON-FASTING
ALBUMIN: 3.9 g/dL (ref 3.5–4.8)
ALKALINE PHOSPHATASE: 52 U/L (ref 38–126)
ALT (SGPT): 21 U/L (ref 7–55)
ANION GAP: 4 mmol/L — ABNORMAL LOW (ref 5–16)
AST (SGOT): 22 U/L (ref 8–48)
BILIRUBIN, TOTAL: 1.6 mg/dL — ABNORMAL HIGH (ref 0.3–1.3)
BUN/CREAT RATIO: 10 (ref 6–22)
BUN: 11 mg/dL (ref 8–26)
CALCIUM: 9 mg/dL (ref 8.5–10.4)
CARBON DIOXIDE: 26 mmol/L (ref 23–33)
CHLORIDE: 104 mmol/L (ref 96–111)
CREATININE: 1.07 mg/dL (ref 0.62–1.27)
ESTIMATED GLOMERULAR FILTRATION RATE: >59 ml/min/1.73m2 (ref >59)
GLUCOSE,NONFAST: 129 mg/dL (ref 65–139)
POTASSIUM: 3.4 mmol/L — ABNORMAL LOW (ref 3.5–5.1)
SODIUM: 134 mmol/L — ABNORMAL LOW (ref 136–145)
TOTAL PROTEIN: 7 g/dL (ref 6.4–8.3)

## 2010-09-21 LAB — LIPASE: LIPASE: 33 U/L (ref 6–51)

## 2010-09-21 LAB — URINALYSIS MACROSCOPIC WITH REFLEX TO MICROSCOPIC URINALYSIS (CULTURE NOT PERFORMED)
BILIRUBIN: NEGATIVE
GLUCOSE: NEGATIVE mg/dL
KETONES: NEGATIVE mg/dL
LEUKOCYTES: NEGATIVE
NITRITE: NEGATIVE
PH URINE: 5 (ref 5.0–8.0)
PROTEIN: NEGATIVE mg/dL
SPECIFIC GRAVITY, URINE: 1.005 (ref 1.005–1.030)
UROBILINOGEN: NORMAL mg/dL

## 2010-09-21 LAB — URINALYSIS, MICROSCOPIC
RBC'S: 0 /HPF (ref 0–4)
WBC'S: 0 /HPF (ref 0–1)

## 2010-09-21 LAB — CBC/DIFF
BASOPHILS: 0 % (ref 0–1)
BASOS ABS: 0 THOU/uL (ref 0.0–0.2)
EOS ABS: 0.148 THOU/uL (ref 0.1–0.3)
EOSINOPHIL: 2 % (ref 1–6)
HCT: 42 % (ref 39.8–50.2)
HGB: 14.6 g/dL (ref 13.1–17.3)
LYMPHOCYTES: 49 % — ABNORMAL HIGH (ref 20–45)
LYMPHS ABS: 3.626 THOU/uL (ref 1.0–4.8)
MCH: 29.6 pg (ref 27.4–33.0)
MCHC: 34.7 g/dL (ref 31.6–35.5)
MCV: 85.3 fL (ref 82.0–99.0)
MONOCYTES: 4 % (ref 4–13)
MONOS ABS: 0.296 THOU/uL (ref 0.1–0.9)
MPV: 8.1 FL (ref 7.4–10.4)
PLATELET COUNT: 210 10*3/uL (ref 140–450)
PMN ABS: 3.33 THOU/uL (ref 1.5–7.7)
PMN'S: 45 % (ref 40–75)
RBC: 4.92 MIL/uL (ref 4.46–5.70)
RDW: 11.8 % (ref 10.2–14.0)
WBC: 7.4 10*3/uL (ref 3.5–11.0)

## 2010-09-21 LAB — SEDIMENTATION RATE: SEDIMENTATION RATE: 2 mm/h (ref 0–15)

## 2010-09-21 LAB — C-REACTIVE PROTEIN(CRP),INFLAMMATION: C-REACTIVE PROTEIN (CRP),INFLAMMATION: 0.086 mg/dL (ref <0.800)

## 2010-09-21 MED ORDER — MORPHINE 4 MG/ML INJECTION SYRINGE
INJECTION | INTRAMUSCULAR | Status: AC
Start: 2010-09-21 — End: 2010-09-22
  Filled 2010-09-21: qty 1

## 2010-09-21 MED ORDER — SIMVASTATIN 20 MG TABLET
20.00 mg | ORAL_TABLET | Freq: Every evening | ORAL | Status: DC
Start: 2010-09-21 — End: 2010-09-26
  Administered 2010-09-21 – 2010-09-24 (×4): 20 mg via ORAL
  Filled 2010-09-21 (×5): qty 1

## 2010-09-21 MED ORDER — MORPHINE 2 MG/ML INJECTION SYRINGE
INJECTION | INTRAMUSCULAR | Status: AC
Start: 2010-09-21 — End: 2010-09-22
  Filled 2010-09-21: qty 1

## 2010-09-21 MED ORDER — HYDROMORPHONE (PF) 2 MG/ML INJECTION SYRINGE
INJECTION | INTRAMUSCULAR | Status: AC
Start: 2010-09-21 — End: 2010-09-21
  Filled 2010-09-21: qty 1

## 2010-09-21 MED ORDER — SODIUM CHLORIDE 0.9 % INTRAVENOUS SOLUTION
INTRAVENOUS | Status: DC
Start: 2010-09-21 — End: 2010-09-26

## 2010-09-21 MED ORDER — ESOMEPRAZOLE MAGNESIUM 40 MG CAPSULE,DELAYED RELEASE
40.00 mg | DELAYED_RELEASE_CAPSULE | Freq: Every morning | ORAL | Status: DC
Start: 2010-09-22 — End: 2010-09-26
  Administered 2010-09-22 – 2010-09-25 (×4): 40 mg via ORAL
  Filled 2010-09-21 (×5): qty 1

## 2010-09-21 MED ORDER — ONDANSETRON HCL (PF) 4 MG/2 ML INJECTION SOLUTION
INTRAMUSCULAR | Status: AC
Start: 2010-09-21 — End: 2010-09-21
  Filled 2010-09-21: qty 2

## 2010-09-21 MED ORDER — MORPHINE 4 MG/ML INJECTION SYRINGE
6.0000 mg | INJECTION | Freq: Once | INTRAMUSCULAR | Status: AC
Start: 2010-09-21 — End: 2010-09-21
  Administered 2010-09-21: 6 mg via INTRAVENOUS

## 2010-09-21 MED ORDER — HYDROCODONE 5 MG-ACETAMINOPHEN 325 MG TABLET
1.0000 | ORAL_TABLET | Freq: Four times a day (QID) | ORAL | Status: DC | PRN
Start: 2010-09-21 — End: 2010-09-21

## 2010-09-21 MED ORDER — ASPIRIN 325 MG TABLET
325.0000 mg | ORAL_TABLET | Freq: Every day | ORAL | Status: DC
Start: 2010-09-22 — End: 2010-09-26
  Administered 2010-09-22 – 2010-09-25 (×4): 325 mg via ORAL
  Filled 2010-09-21 (×4): qty 1

## 2010-09-21 MED ORDER — HYDROMORPHONE (PF) 2 MG/ML INJECTION SYRINGE
0.8000 mg | INJECTION | Freq: Once | INTRAMUSCULAR | Status: AC
Start: 2010-09-21 — End: 2010-09-21

## 2010-09-21 MED ORDER — TRAMADOL 50 MG TABLET
100.0000 mg | ORAL_TABLET | ORAL | Status: DC | PRN
Start: 2010-09-21 — End: 2010-09-26
  Administered 2010-09-21 – 2010-09-25 (×6): 100 mg via ORAL
  Filled 2010-09-21 (×6): qty 2

## 2010-09-21 MED ORDER — NITROGLYCERIN 0.3 MG SUBLINGUAL TABLET
0.30 mg | SUBLINGUAL_TABLET | SUBLINGUAL | Status: DC
Start: 2010-09-21 — End: 2010-09-21
  Administered 2010-09-21 (×2): 0 mg via SUBLINGUAL
  Filled 2010-09-21 (×97): qty 1

## 2010-09-21 MED ORDER — ONDANSETRON HCL (PF) 4 MG/2 ML INJECTION SOLUTION
4.0000 mg | Freq: Once | INTRAMUSCULAR | Status: AC
Start: 2010-09-21 — End: 2010-09-21

## 2010-09-21 MED ORDER — HYDROMORPHONE (PF) 2 MG/ML INJECTION SYRINGE
0.5000 mg | INJECTION | Freq: Once | INTRAMUSCULAR | Status: AC
Start: 2010-09-21 — End: 2010-09-21

## 2010-09-21 MED ORDER — IOVERSOL 320 MG IODINE/ML INTRAVENOUS SOLUTION
100.00 mL | INTRAVENOUS | Status: AC
Start: 2010-09-21 — End: 2010-09-21
  Administered 2010-09-21: 100 mL via INTRAVENOUS

## 2010-09-21 MED ORDER — HYDROMORPHONE (PF) 2 MG/ML INJECTION SYRINGE
1.0000 mg | INJECTION | Freq: Once | INTRAMUSCULAR | Status: AC
Start: 2010-09-21 — End: 2010-09-21

## 2010-09-21 MED ORDER — DIATRIZOATE MEGLUMINE-DIATRIZOATE SODIUM 66 %-10 % ORAL SOLUTION
8.00 mL | ORAL | Status: AC
Start: 2010-09-21 — End: 2010-09-21
  Administered 2010-09-21: 8 mL via ORAL

## 2010-09-21 MED ORDER — ONDANSETRON HCL (PF) 4 MG/2 ML INJECTION SOLUTION
INTRAMUSCULAR | Status: AC
Start: 2010-09-21 — End: 2010-09-22
  Filled 2010-09-21: qty 2

## 2010-09-21 MED ORDER — ENOXAPARIN 40 MG/0.4 ML SUBCUTANEOUS SYRINGE
40.0000 mg | INJECTION | SUBCUTANEOUS | Status: DC
Start: 2010-09-21 — End: 2010-09-26
  Administered 2010-09-21 – 2010-09-24 (×4): 40 mg via SUBCUTANEOUS
  Filled 2010-09-21 (×5): qty 0.4

## 2010-09-21 MED ORDER — METOPROLOL TARTRATE 25 MG TABLET
25.00 mg | ORAL_TABLET | Freq: Two times a day (BID) | ORAL | Status: DC
Start: 2010-09-21 — End: 2010-09-26
  Administered 2010-09-21 – 2010-09-24 (×7): 25 mg via ORAL
  Administered 2010-09-25: 0 mg via ORAL
  Filled 2010-09-21 (×9): qty 1

## 2010-09-21 MED ORDER — HYDROMORPHONE (PF) 2 MG/ML INJECTION SYRINGE
0.2000 mg | INJECTION | INTRAMUSCULAR | Status: DC | PRN
Start: 2010-09-21 — End: 2010-09-22
  Administered 2010-09-21 – 2010-09-22 (×2): 0.2 mg via INTRAVENOUS
  Filled 2010-09-21 (×2): qty 1

## 2010-09-21 MED ORDER — NITROGLYCERIN 0.3 MG SUBLINGUAL TABLET
0.30 mg | SUBLINGUAL_TABLET | SUBLINGUAL | Status: DC | PRN
Start: 2010-09-21 — End: 2010-09-26
  Filled 2010-09-21 (×5): qty 1

## 2010-09-21 MED ORDER — MORPHINE 4 MG/ML INJECTION SYRINGE
6.0000 mg | INJECTION | Freq: Once | INTRAMUSCULAR | Status: DC
Start: 2010-09-21 — End: 2010-09-26
  Administered 2010-09-21: 0 mg via INTRAVENOUS

## 2010-09-21 NOTE — Nurses Notes (Signed)
2100: Pt admitted to floor from ED for complaints of abdominal pain, has a history of Crohn's. Upon arrival, patient is alert and oriented, vitals stable. Pt complains of LLQ abdominal pain, denies nausea at this time. Lung sounds clear, 93% on RA. NS infusing at 54ml/hour. Attempted to collect blood cultures, unsuccessful at this time, will retry. Pt oriented to room and call light system. Instructed not to eat or drink anything at this time due to NPO status. Awaiting stool sample. Complete assessment per flow sheet, will continue to monitor.

## 2010-09-21 NOTE — ED Nurses Note (Signed)
Patient report called to [facility].  Patient transport called to take patient to room

## 2010-09-21 NOTE — ED Nurses Note (Signed)
Patient drinking his CT contrast, he was instructed to call me when he finished it.  He was also instructed to obtain a urine specimen.  Warm blankets given to patient.

## 2010-09-21 NOTE — ED Nurses Note (Signed)
Patient informed me that he has a history of crohns disease and he thinks he is having another flare up of it.  His IV was established prior to arrival.  He denies any other medical problems at this time.

## 2010-09-21 NOTE — ED Nurses Note (Signed)
Patient given hydromorphone per orders.

## 2010-09-21 NOTE — ED Attending Note (Signed)
Note begun by:  Lyndel Pleasure, MD 09/21/2010, 12:23 PM    I was physically present and directly supervised this patient's care.  Patient seen and examined.  Resident / Trixie Dredge / NP history and exam reviewed.   Key elements in addition to and/or correction of that documentation are as follows:    HPI :    44 y.o. male presents with chief complaint:  Abdominal Pain  LLQ pain  Hx Crohn's disease.  Started this AM.  Lambert Mody pain.  No vomiting.  +Nausea.  BM this AM was non bloody.      PE :   BP 126/84   Pulse 72   Temp 36.7 C (98.1 F)   Resp 16   Ht 1.778 m (5\' 10" )   Wt 97.523 kg (215 lb)   BMI 30.85 kg/m2   SpO2 96%   AAO.  Appears uncomfortable  Abd: soft.  +TTP to LLQ and LUQ    Data/Test :    EKG : None  Images Review by me : CT a/p negative  Image Reports Review by me : As above  Labs :   I reviewed the laboratory tests that were ordered in the ED.  The following include any abnormal lab values if any:  Labs Reviewed   CBC/DIFF - Abnormal; Notable for the following:    . LYMPHOCYTES 49 (*)     All other components within normal limits   COMPREHENSIVE METABOLIC PANEL, NF - Abnormal; Notable for the following:    . BILIRUBIN, TOTAL 1.6 (*) Naproxen treatment can falsely elevate total bilirubin levels.   Marland Kitchen POTASSIUM 3.4 (*)    . SODIUM 134 (*)    . ANION GAP 4 (*)     All other components within normal limits   URINALYSIS W/CULTURE (INPT ROUTINE) - Abnormal; Notable for the following:    . BLOOD SMALL (*)     All other components within normal limits   LIPASE   MICROSCOPIC URINALYSIS             Review of Prior Data :       Prior Images : None  Prior EKG : None  Online Medical Records : None  Transfer Docs/Images : None    Clinical Impression :   Abdominal pain    MDM :       ED Course :     Filed Vitals:    09/21/10 0834 09/21/10 1216   BP: 132/90 126/84   Pulse: 79 72   Temp: 36.7 C (98.1 F)    Resp: 17 16   Height: 1.778 m (5\' 10" )    Weight: 97.523 kg (215 lb)    SpO2: 97% 96%            Plan :      Orders Placed This Encounter   . CANCELED: URINE CULTURE,ROUTINE   . CT ABDOMEN PELVIS W IV CONTRAST   . CBC/DIFF   . COMPREHENSIVE METABOLIC PANEL, NF   . LIPASE   . URINALYSIS W/CULTURE (INPT ROUTINE)   . MICROSCOPIC URINALYSIS   . ondansetron (ZOFRAN) injection ---Jaymes Graff   . morphine 4 mg/mL injection    . morphine injection ---Morgan Stanley   . morphine injection ---Morgan Stanley   . ioversol (OPTIRAY 320) infusion    . diatrizoate meglumine & sodium (MD-GASTROVIEW) oral solution    . morphine 4 mg/mL injection    . HYDROmorphone (DILAUDID) 2 mg/mL injection    . hydromorphone (PF) (  DILAUDID) injection ---Cabinet Override       Continued pain in ED.  Plan to call medicine to admit for further evaluation and pain control.    Dispo :   Admit      CRITICAL CARE : None

## 2010-09-21 NOTE — ED Attending Handoff Note (Signed)
Transfer of Care  Time:  2:49 PM on 09/21/2010  I have assumed the care of Spencer [name] from Dr. Denyce Robert after discussing his condition and plan of care.  I will follow-up on any pending ancillary studies and provide further treatment and management of the patient as deemed necessary until their disposition from the department.      44 yo male presenting to ED with crohns flareup. Admitted to medicine.   CT done here that was negative.     I have monitored the events of this patient until the time of disposition and the patient's stay has remained uncomplicated.  Management plan has been executed as indicated by primary attending plan. Patient understands plan for disposition and agrees to proceed as directed.

## 2010-09-21 NOTE — ED Nurses Note (Signed)
Patient went to CT scan via cart.

## 2010-09-21 NOTE — ED Nurses Note (Signed)
Report given to oncoming shift.

## 2010-09-21 NOTE — ED Nurses Note (Signed)
Zofran 4 mg replaced for EMS.

## 2010-09-21 NOTE — ED Nurses Note (Signed)
Assumed care at this time.  Report received from Rick Farmer LPN

## 2010-09-21 NOTE — Nurses Notes (Signed)
2 sets of blood cultures collected and sent.

## 2010-09-21 NOTE — ED Nurses Note (Signed)
8E called to give report and nurse refused because room is not clean yet and change of shift

## 2010-09-21 NOTE — ED Nurses Note (Signed)
Patient resting quietly no complaints at this time.  Awaiting room to be cleaned.

## 2010-09-21 NOTE — Nurses Notes (Signed)
2330: Pt reporting 7/10 LLQ abdominal pain. Medicated with .2mg  dilaudid per prn order. Will continue to monitor.

## 2010-09-21 NOTE — ED Resident Handoff Note (Signed)
Code Status: Full    Allergies   Allergen Reactions   . Nkda (No Known Drug Allergies)          Filed Vitals:    09/21/10 0834 09/21/10 1216 09/21/10 1520   BP: 132/90 126/84 116/78   Pulse: 79 72 70   Temp: 36.7 C (98.1 F)     Resp: 17 16 16    Height: 1.778 m (5\' 10" )     Weight: 97.523 kg (215 lb)     SpO2: 97% 96% 98%         HPI:  In brief, patient is a 44 y.o. malewho presents to the emergency department due to abd pain.  Pt has h/o chrones disease, and this morning developed pain similar to prior flairs.  Pt denies fevers, N/V, or diarrhea.  Not currently on treatment.      Pertinent Exam Findings:  LLQ tenderness to palpation, which is reportedly distractable    Pertinent Imaging/Lab results:  CT AP:  Unremarkable  Labs unremarkable    Pending Studies:  None    Consults:  Medicine    Plan:  Pt being admitted to medicine for continued pain control for possible chrones flair.      After a thorough discussion of the patient including presentation, ED course, and review of above information I have assumed care of Azaryah A Dingee from Dr. Tenny Craw at 4:53 PM    Luanne Bras, MD    Course:    Pt admitted to medicine.

## 2010-09-21 NOTE — ED Nurses Note (Signed)
Patient states pain medication helped.  Med 1 into consult for admit

## 2010-09-22 LAB — BASIC METABOLIC PANEL
ANION GAP: 7 mmol/L (ref 5–16)
BUN/CREAT RATIO: 9 (ref 6–22)
BUN: 8 mg/dL (ref 8–26)
CALCIUM: 8.7 mg/dL (ref 8.5–10.4)
CARBON DIOXIDE: 26 mmol/L (ref 23–33)
CHLORIDE: 101 mmol/L (ref 96–111)
CREATININE: 0.94 mg/dL (ref 0.62–1.27)
ESTIMATED GLOMERULAR FILTRATION RATE: >59 ml/min/1.73m2 (ref >59)
GLUCOSE,NONFAST: 98 mg/dL (ref 65–139)
POTASSIUM: 3.5 mmol/L (ref 3.5–5.1)
SODIUM: 134 mmol/L — ABNORMAL LOW (ref 136–145)

## 2010-09-22 LAB — CBC/DIFF
BASOPHILS: 0 % (ref 0–1)
BASOS ABS: 0.026 THOU/uL (ref 0.0–0.2)
EOS ABS: 0.204 THOU/uL (ref 0.1–0.3)
EOSINOPHIL: 3 % (ref 1–6)
HCT: 39.3 % — ABNORMAL LOW (ref 39.8–50.2)
HGB: 14 g/dL (ref 13.1–17.3)
LYMPHOCYTES: 44 % (ref 20–45)
LYMPHS ABS: 2.96 THOU/uL (ref 1.0–4.8)
MCH: 30.7 pg (ref 27.4–33.0)
MCHC: 35.7 g/dL — ABNORMAL HIGH (ref 31.6–35.5)
MCV: 86.1 fL (ref 82.0–99.0)
MONOCYTES: 5 % (ref 4–13)
MONOS ABS: 0.363 THOU/uL (ref 0.1–0.9)
MPV: 8.2 FL (ref 7.4–10.4)
NRBC'S: 0 /100{WBCs}
PLATELET COUNT: 207 THOU/uL (ref 140–450)
PMN ABS: 3.2 THOU/uL (ref 1.5–7.7)
PMN'S: 48 % (ref 40–75)
RBC: 4.56 MIL/uL (ref 4.46–5.70)
RDW: 11.8 % (ref 10.2–14.0)
WBC: 6.8 THOU/uL (ref 3.5–11.0)

## 2010-09-22 LAB — MAGNESIUM: MAGNESIUM: 1.8 mg/dL (ref 1.7–2.5)

## 2010-09-22 LAB — PHOSPHORUS: PHOSPHORUS: 3.9 mg/dL (ref 2.4–4.7)

## 2010-09-22 MED ORDER — HYDROCODONE 5 MG-ACETAMINOPHEN 325 MG TABLET
1.0000 | ORAL_TABLET | Freq: Four times a day (QID) | ORAL | Status: DC | PRN
Start: 2010-09-22 — End: 2010-09-23
  Administered 2010-09-22 (×2): 1 via ORAL
  Filled 2010-09-22 (×2): qty 1

## 2010-09-22 NOTE — Consults (Addendum)
[facility]  Digestive Diseases Consult      [name],[name], 44 y.o. male  Date of Admission:  09/21/2010  Date of service: 09/22/2010  Date of Birth:  [birth year]    Hospital Day:  LOS: 1 day     Service: MEDICINE 1  Requesting MD: Asencion Noble, MD     Information Obtained from: patient  Chief Complaint:  LLQ abdominal pain / Nausea and vomiting.     Assessment/Recommendations: 44 y.o. male admitted with 1 day history of L.L.Q abdominal pain with one episode of loose stools.   Impression : IBS vs non specific colitis vs IBD     - Please send stool studies.   - Please start full liquid diet and advance as tolerated.   - Please order Small bowel follow through.   - Will consider colonoscopy either as inpatient on Friday if patient will be in hospital or as out patient if discharged.     Thank you for consulting G.I. Team ,      HPI/Discussion:  Spencer [name] is a 44 y.o., White male who presents with 1 day history of LLQ abdominal pain which is described as sharp , constant , radiates to Right side, 10/10 in severity,No relieving or aggravating factors. This pain was associated with one episode of loose stools with no blood (Has history of hemorrhoids with occasional spotting at times). He also gave history of feeling nauseated and vomited once since yesterday which was mainly clear vomitus. He denied hematemesis or significant weight changes as his weight fluctuates at times. He denied fevers or rigors but admitted to chills. He stated that he takes orally well but certain foods make his symptoms like pop corn or nuts which he tries to avoid. His past medical history is significant for chronic abdominal pain for over 10 years. He has been having more frequent episodes in the last two months. Two years ago he had two colonoscopies done at [facility] and [facility] which showed no specific inflammation and lymphocytic colitis. His family history is negative for IBD / Cancer. He was told in the past that this might be early changes for Crohn's disease.       Past Medical History   Diagnosis Date   . Colitis    . 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 Diabetes mellitus      borderline   . Headache    . Reflux    . Hyperlipidemia 12/26/2007   . Chest pain 12/23/2007   . Borderline diabetes mellitus 12/23/2007   . Spells 06/16/2008   . Stress test 05/28/2010   . S/P cardiac catheterization 05/28/2010   . PAD (peripheral artery disease) 06/03/2010   . Hypertriglyceridemia 06/03/2010   . Anginal pain 06/03/2010   . CAD (coronary artery disease) 06/03/2010   . Crohn's disease 06/14/2010   . IBS (irritable bowel syndrome) 06/14/2010   . GERD (gastroesophageal reflux disease) 06/14/2010     Past Surgical History   Procedure Date   . Pb colonoscopy,biopsy    . Hx laparotomy    . Hx heart catheterization      Prescriptions prior to admission    Medication Sig Dispense Refill   . Gabapentin (NEURONTIN) 400 mg Oral Capsule take 400 mg by mouth every night.       . esomeprazole magnesium (NEXIUM) 40 mg CpDR take 40 mg by mouth. Every other day        .  aspirin 325 mg Tab take 325 mg by mouth Once a day.       . metoprolol (LOPRESSOR) 25 mg Tab take 1 Tab by mouth Twice daily.  60 Tab  5   . nitroglycerin (NITROSTAT) 0.4 mg Subl by Sublingual route. for 3 doses over 15 minutes  25 Tab  3   . simvastatin (ZOCOR) 20 mg Tab take 1 Tab by mouth QPM.  30 Tab  3   . naproxen (NAPROSYN) 250 mg Tab take 1 Tab by mouth Every 8 hours as needed for Pain.   62  0       Current facility-administered medications:  HYDROcodone-acetaminophen (NORCO) 5-325 mg per tablet  1 Tab Oral Q6H PRN   morphine 4 mg/mL injection  6 mg Intravenous Once   aspirin tablet 325 mg 325 mg Oral Daily   esomeprazole (NEXIUM) capsule  40 mg Oral QAM   metoprolol (LOPRESSOR) tablet  25 mg Oral 2x/day   simvastatin (ZOCOR) tablet  20 mg Oral QPM   enoxaparin (LOVENOX) 40 mg/0.4 mL SubQ injection  40 mg Subcutaneous Q24H   NS premix infusion   Intravenous Continuous   traMADol (ULTRAM) tablet  100 mg Oral Q4H PRN   nitroglycerin (NITROSTAT) sublingual tablet  0.3 mg Sublingual Q5 Min PRN     Allergies   Allergen Reactions   . Nkda (No Known Drug Allergies)        Family History  Family History   Problem Relation Age of Onset   . Hypertension Mother    . Hypertension Brother    . Diabetes Brother    . Hypertension Brother          Social History  History   Social History   . Marital Status: Married     Spouse Name: Alvy Beal     Number of Children: 4   . Years of Education: N/A   Occupational History   .  [employer]   Social History Main Topics   . Smoking status: Never Smoker    . Smokeless tobacco: Not on file   . Alcohol Use: No   . Drug Use: No   . Sexually Active: Not on file   Other Topics Concern   . Not on file   Social History Narrative   . No narrative on file       ROS:    Constitutional: positive for chills, negative for fevers, sweats, fatigue, malaise and anorexia  Eyes: negative for glaucoma and visual disturbance  Ears, nose, mouth, throat, and face: negative for hearing loss, tinnitus, ear drainage, sore mouth and sore throat  Respiratory: negative for cough, sputum, asthma, wheezing or dyspnea on exertion  Cardiovascular: negative for chest pain, chest pressure/discomfort, syncope, fatigue, orthopnea, paroxysmal nocturnal dyspnea and lower extremity edema  Gastrointestinal: positive for reflux symptoms, nausea, vomiting, diarrhea and abdominal pain  Genitourinary:negative for frequency, dysuria, nocturia and decreased stream  Integument/breast: negative  Hematologic/lymphatic: negative  Musculoskeletal:negative for myalgias, arthralgias, neck pain, back pain and muscle weakness  Neurological: positive for headaches, negative for dizziness, vertigo, seizures, memory problems and speech problems  Behavioral/Psych: negative  Endocrine: negative for diabetic symptoms including polyuria, polydipsia and weight loss  Allergic/Immunologic: negative      EXAM:  Temperature: 36.5 C (97.7 F)  Heart Rate: 55   BP (Non-Invasive): 129/79 mmHg  Respiratory Rate: 18   SpO2-1: 97 %  Pain Score: 6  General: appears in good health, appears stated age and no distress  Eyes: Conjunctiva clear., Pupils equal and round, reactive to light and accomodation.   HENT:Head atraumatic and normocephalic  Neck: No JVD or thyromegaly or lymphadenopathy  Lungs: Clear to auscultation bilaterally.   Cardiovascular: regular rate and rhythm, S1, S2 normal, no murmur, click, rub or gallop  Abdomen: Soft, non-tender, Bowel sounds normal, No hepatosplenomegaly, No CVA Tenderness, Well healed scar  Genito-urinary: Deferred  Extremities: extremities normal, atraumatic, no cyanosis or edema  Skin: Skin warm and dry   Neurologic: Alert and oriented X 3, normal strength and tone. Normal symmetric reflexes. Normal coordination and gait  Lymphatics: No lymphadenopathy, Cervical, supraclavicular, and axillary nodes normal.  Psychiatric: Normal affect, behavior, memory, thought content, judgement, and speech.    Labs:  Lab Results for Last 24 Hours:    Results for orders placed during the hospital encounter of 09/21/10 (from the past 24 hour(s))   ADULT ROUTINE BLOOD CULTURE (2 BTLS)    Collection Time    09/21/10 11:00 PM   Component Value Range   . SPECIMEN DESCRIPTION        Value: BLOOD      RIGHT      ANCB   . SPECIAL REQUESTS        Value: 1 BacT/ALERT FA and 1 BacT/ALERT SN bottle received   . POSITIVE CULTURE NOT REPORTED     . CULTURE OBSERVATION NO GROWTH <24 HRS     . REPORT STATUS PENDING     ADULT ROUTINE BLOOD CULTURE (2 BTLS)    Collection Time    09/21/10 11:05 PM   Component Value Range   . SPECIMEN DESCRIPTION        Value: BLOOD      RIGHT      HAND   . SPECIAL REQUESTS        Value: 1 BacT/ALERT FA and 1 BacT/ALERT SN bottle received   . POSITIVE CULTURE NOT REPORTED     . CULTURE OBSERVATION NO GROWTH <24 HRS     . REPORT STATUS PENDING     CBC/DIFF    Collection Time    09/22/10 12:19 AM   Component Value Range   . WBC 6.8  3.5 - 11.0 (THOU/uL)   . RBC 4.56  4.46 - 5.70 (MIL/uL)   . HGB 14.0  13.1 - 17.3 (g/dL)   . HCT 39.3 (*) 39.8 - 50.2 (%)   . MCV 86.1  82.0 - 99.0 (fL)   . MCH 30.7  27.4 - 33.0 (pg)   . MCHC 35.7 (*) 31.6 - 35.5 (g/dL)   . RDW 11.8  10.2 - 14.0 (%)   . PLATELET COUNT 207  140 - 450 (THOU/uL)   . MPV 8.2  7.4 - 10.4 (FL)   . PMN'S 48  40 - 75 (%)   . PMN ABS 3.200  1.5 - 7.7 (THOU/uL)   . LYMPHOCYTES 44  20 - 45 (%)   . LYMPHS ABS 2.960  1.0 - 4.8 (THOU/uL)   . MONOCYTES 5  4 - 13 (%)   . MONOS ABS 0.363  0.1 - 0.9 (THOU/uL)   . EOSINOPHIL 3  1 - 6 (%)   . EOS ABS 0.204  0.1 - 0.3 (THOU/uL)   . BASOPHILS 0  0 - 1 (%)   . BASOS ABS 0.026  0.0 - 0.2 (THOU/uL)   . NRBC'S 0  0 (/100WBC)    BASIC METABOLIC PANEL, NON-FASTING    Collection Time    09/22/10 12:19  AM   Component Value Range   . SODIUM 134 (*) 136 - 145 (mmol/L)   . POTASSIUM 3.5  3.5 - 5.1 (mmol/L)   . CHLORIDE 101  96 - 111 (mmol/L)   . CARBON DIOXIDE 26  23 - 33 (mmol/L)   . ANION GAP 7  5 - 16 (mmol/L)   . CREATININE 0.94  0.62 - 1.27 (mg/dL)   . ESTIMATED GLOMERULAR FILTRATION RATE >59  >59 (ml/min/1.105m2)   . GLUCOSE,NONFAST 98  65 - 139 (mg/dL)   . BUN 8  8 - 26 (mg/dL)   . BUN/CREAT RATIO 9  6 - 22    . CALCIUM 8.7  8.5 - 10.4 (mg/dL)   MAGNESIUM    Collection Time    09/22/10 12:19 AM   Component Value Range   . MAGNESIUM 1.8  1.7 - 2.5 (mg/dL)   PHOSPHORUS    Collection Time    09/22/10 12:19 AM   Component Value Range   . PHOSPHORUS 3.9  2.4 - 4.7 (mg/dL)         Imaging Studies:  CT:   Unremarkable.     Deloris Ping, MD 09/22/2010, 3:55 PM    I saw and evaluated the patient. I reviewed the resident's /fellow's note and agree with the documented findings and plan of care.   Knox Saliva, MD   Assistant Professor of Medicine   Section of Digestive Diseases   [facility]   09/22/2010 10:12 PM

## 2010-09-22 NOTE — Progress Notes (Addendum)
[facility]  Medicine Progress Note    Spencer [name]  Date of service: 09/22/2010  Date of Admission:  09/21/2010    Hospital Day:  LOS: 1 day   Subjective: Patient states his pain has improved since yesterday is now 6/10. No fever, chills. Patient has not had a bowel movement since yesterday. No vomiting    Vital Signs:  Temp (24hrs) Max:36.7 C (98.1 F)      Systolic (24hrs), Avg:124 mmHg, Min:116 mmHg, Max:132 mmHg    Diastolic (24hrs), Avg:82 mmHg, Min:74 mmHg, Max:90 mmHg    Temp  Avg: 36.6 C (97.9 F)  Min: 36.3 C (97.3 F)  Max: 36.7 C (98.1 F)  Pulse  Avg: 67.4   Min: 56   Max: 79   Resp  Avg: 17.6   Min: 16   Max: 20   SpO2  Avg: 95.9 %  Min: 93 %  Max: 98 %  Pain Score: 5  Fi02    I/O:  I/O last 24 hours:    Intake/Output Summary (Last 24 hours) at 09/22/10 0813  Last data filed at 09/22/10 0600   Gross per 24 hour   Intake    760 ml   Output    700 ml   Net     60 ml       I/O current shift:         Current facility-administered medications:  ondansetron (ZOFRAN) injection ---Cabinet Override      morphine injection ---Cabinet Override      morphine 4 mg/mL injection  6 mg Intravenous Once   aspirin tablet 325 mg 325 mg Oral Daily   esomeprazole (NEXIUM) capsule  40 mg Oral QAM   metoprolol (LOPRESSOR) tablet  25 mg Oral 2x/day   simvastatin (ZOCOR) tablet  20 mg Oral QPM   enoxaparin (LOVENOX) 40 mg/0.4 mL SubQ injection  40 mg Subcutaneous Q24H   NS premix infusion   Intravenous Continuous   traMADol (ULTRAM) tablet  100 mg Oral Q4H PRN   nitroglycerin (NITROSTAT) sublingual tablet  0.3 mg Sublingual Q5 Min PRN         Allergies   Allergen Reactions   . Nkda (No Known Drug Allergies)          Physical Exam:  General: appears in good health  Eyes: Conjunctiva clear., Sclera non-icteric.   Lungs: Clear to auscultation bilaterally.   Cardiovascular: regular rate and rhythm  Abdomen: soft, tender in the left lower quadrant  Extremities: No cyanosis or edema    Labs:   Lab Results for Last 24 Hours:    Results for orders placed during the hospital encounter of 09/21/10 (from the past 24 hour(s))   CBC/DIFF    Collection Time    09/21/10  9:30 AM   Component Value Range   . WBC 7.4  3.5 - 11.0 (THOU/uL)   . RBC 4.92  4.46 - 5.70 (MIL/uL)   . HGB 14.6  13.1 - 17.3 (g/dL)   . HCT 42.0  39.8 - 50.2 (%)   . MCV 85.3  82.0 - 99.0 (fL)   . MCH 29.6  27.4 - 33.0 (pg)   . MCHC 34.7  31.6 - 35.5 (g/dL)   . RDW 11.8  10.2 - 14.0 (%)   . PLATELET COUNT 210  140 - 450 (THOU/uL)   . MPV 8.1  7.4 - 10.4 (FL)   . PMN'S 45  40 - 75 (%)   . PMN  ABS 3.330  1.5 - 7.7 (THOU/uL)   . LYMPHOCYTES 49 (*) 20 - 45 (%)   . LYMPHS ABS 3.626  1.0 - 4.8 (THOU/uL)   . MONOCYTES 4  4 - 13 (%)   . MONOS ABS 0.296  0.1 - 0.9 (THOU/uL)   . EOSINOPHIL 2  1 - 6 (%)   . EOS ABS 0.148  0.1 - 0.3 (THOU/uL)   . BASOPHILS 0  0 - 1 (%)   . BASOS ABS 0.000  0.0 - 0.2 (THOU/uL)   . RBC MORPHOLOGY        Value: No significant types of abnormal RBC and or Platelet morphology noted      in microscopic review.   COMPREHENSIVE METABOLIC PANEL, NF    Collection Time    09/21/10  9:30 AM   Component Value Range   . ALBUMIN 3.9  3.5 - 4.8 (g/dL)   . BILIRUBIN, TOTAL 1.6 (*) 0.3 - 1.3 (mg/dL)   . CALCIUM 9.0  8.5 - 10.4 (mg/dL)   . CHLORIDE 104  96 - 111 (mmol/L)   . CREATININE 1.07  0.62 - 1.27 (mg/dL)   . ESTIMATED GLOMERULAR FILTRATION RATE >59  >59 (ml/min/1.77m2)   . GLUCOSE,NONFAST 129  65 - 139 (mg/dL)   . ALKALINE PHOSPHATASE 52  38 - 126 (U/L)   . POTASSIUM 3.4 (*) 3.5 - 5.1 (mmol/L)   . TOTAL PROTEIN 7.0  6.4 - 8.3 (g/dL)   . SODIUM 134 (*) 136 - 145 (mmol/L)   . AST (SGOT) 22  8 - 48 (U/L)   . BUN 11  8 - 26 (mg/dL)   . BUN/CREAT RATIO 10  6 - 22    . CARBON DIOXIDE 26  23 - 33 (mmol/L)   . ANION GAP 4 (*) 5 - 16 (mmol/L)   . ALT (SGPT) 21  7 - 55 (U/L)   LIPASE    Collection Time    09/21/10  9:30 AM   Component Value Range   . LIPASE 33  6 - 51 (U/L)   C-REACTIVE PROTEIN(CRP),INFLAMMATION    Collection Time     09/21/10  9:30 AM   Component Value Range   . C-Reactive Protein High Sensitivity (Inflammation) 0.086  <0.800 (mg/dL)   SEDIMENTATION RATE    Collection Time    09/21/10  9:30 AM   Component Value Range   . SEDIMENTATION RATE 2  0 - 15 (mm/hr)   URINALYSIS W/CULTURE (INPT ROUTINE)    Collection Time    09/21/10 10:24 AM   Component Value Range   . CHARACTER CLEAR     . COLOR YELLOW     . SPECIFIC GRAVITY, URINE 1.005  1.005 - 1.030    . GLUCOSE NEGATIVE  NEGATIVE (mg/dL)   . BILIRUBIN NEGATIVE  NEGATIVE    . KETONES NEGATIVE  NEGATIVE (mg/dL)   . BLOOD SMALL (*) NEGATIVE    . PH URINE 5.0  5.0 - 8.0    . PROTEIN NEGATIVE  NEGATIVE (mg/dL)   . UROBILINOGEN NORMAL  0.2 (mg/dL)   . NITRITE NEGATIVE  NEGATIVE    . LEUKOCYTES NEGATIVE  NEGATIVE    MICROSCOPIC URINALYSIS    Collection Time    09/21/10 10:24 AM   Component Value Range   . RBC'S 0 TO 2  0 - 4 (/HPF)   . WBC'S 0 TO 2  0 - 1 (/HPF)   . BACTERIA OCCASIONAL OR LESS  OCL^OCCASIONAL OR LESS    .  Hyaline Cast Test Not Performed  0 - 3 (/LPF)   . Squamous Epithelial RARE  (/LPF)   . White blood cell clump Test Not Performed  (/HPF)   . Fat Test Not Performed  (/HPF)   . Transitional Epithelial Test Not Performed  (/LPF)   . Renal Epithelial Test Not Performed  (/LPF)   . Hyphae Yeast Test Not Performed  (/HPF)   . Budding Yeast Test Not Performed  (/HPF)   . Mucous LIGHT  (/LPF)   . Oval Fat Body Test Not Performed  (/HPF)   . Epithelial Cast Test Not Performed  (/LPF)   . White Blood Cell Cast Test Not Performed  (/LPF)   . Red Blood Cell Cast Test Not Performed  (/LPF)   . Granular Cast Test Not Performed  (/LPF)   . Cellular Cast Test Not Performed  (/LPF)   . Broad Cast Test Not Performed  (/LPF)   . Fatty Cast Test Not Performed  (/LPF)   . Waxy Cast Test Not Performed  (/LPF)   . Amorphous Crystal Test Not Performed  (/HPF)   . Triple Phosphate Crystal Test Not Performed  (/HPF)   . Calicum Oxalate Crystal Test Not Performed  (/HPF)    . Calicum Phosphate Crystal Test Not Performed  (/HPF)   . Calcium Carbonate Crystal Test Not Performed  (/HPF)   . IRIS COMMENTS Test Not Performed     . Uric Acid Crystal Test Not Performed  (/HPF)   ADULT ROUTINE BLOOD CULTURE (2 BTLS)    Collection Time    09/21/10 11:00 PM   Component Value Range   . SPECIMEN DESCRIPTION        Value: BLOOD      RIGHT      ANCB   . SPECIAL REQUESTS        Value: 1 BacT/ALERT FA and 1 BacT/ALERT SN bottle received   . POSITIVE CULTURE NOT REPORTED     . CULTURE OBSERVATION NO GROWTH <24 HRS     . REPORT STATUS PENDING     ADULT ROUTINE BLOOD CULTURE (2 BTLS)    Collection Time    09/21/10 11:05 PM   Component Value Range   . SPECIMEN DESCRIPTION        Value: BLOOD      RIGHT      HAND   . SPECIAL REQUESTS        Value: 1 BacT/ALERT FA and 1 BacT/ALERT SN bottle received   . POSITIVE CULTURE NOT REPORTED     . CULTURE OBSERVATION NO GROWTH <24 HRS     . REPORT STATUS PENDING     CBC/DIFF    Collection Time    09/22/10 12:19 AM   Component Value Range   . WBC 6.8  3.5 - 11.0 (THOU/uL)   . RBC 4.56  4.46 - 5.70 (MIL/uL)   . HGB 14.0  13.1 - 17.3 (g/dL)   . HCT 39.3 (*) 39.8 - 50.2 (%)   . MCV 86.1  82.0 - 99.0 (fL)   . MCH 30.7  27.4 - 33.0 (pg)   . MCHC 35.7 (*) 31.6 - 35.5 (g/dL)   . RDW 11.8  10.2 - 14.0 (%)   . PLATELET COUNT 207  140 - 450 (THOU/uL)   . MPV 8.2  7.4 - 10.4 (FL)   . PMN'S 48  40 - 75 (%)   . PMN ABS 3.200  1.5 - 7.7 (THOU/uL)   .  LYMPHOCYTES 44  20 - 45 (%)   . LYMPHS ABS 2.960  1.0 - 4.8 (THOU/uL)   . MONOCYTES 5  4 - 13 (%)   . MONOS ABS 0.363  0.1 - 0.9 (THOU/uL)   . EOSINOPHIL 3  1 - 6 (%)   . EOS ABS 0.204  0.1 - 0.3 (THOU/uL)   . BASOPHILS 0  0 - 1 (%)   . BASOS ABS 0.026  0.0 - 0.2 (THOU/uL)   . NRBC'S 0  0 (/100WBC)   BASIC METABOLIC PANEL, NON-FASTING    Collection Time    09/22/10 12:19 AM   Component Value Range   . SODIUM 134 (*) 136 - 145 (mmol/L)   . POTASSIUM 3.5  3.5 - 5.1 (mmol/L)   . CHLORIDE 101  96 - 111 (mmol/L)    . CARBON DIOXIDE 26  23 - 33 (mmol/L)   . ANION GAP 7  5 - 16 (mmol/L)   . CREATININE 0.94  0.62 - 1.27 (mg/dL)   . ESTIMATED GLOMERULAR FILTRATION RATE >59  >59 (ml/min/1.73m2)   . GLUCOSE,NONFAST 98  65 - 139 (mg/dL)   . BUN 8  8 - 26 (mg/dL)   . BUN/CREAT RATIO 9  6 - 22    . CALCIUM 8.7  8.5 - 10.4 (mg/dL)   MAGNESIUM    Collection Time    09/22/10 12:19 AM   Component Value Range   . MAGNESIUM 1.8  1.7 - 2.5 (mg/dL)   PHOSPHORUS    Collection Time    09/22/10 12:19 AM   Component Value Range   . 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 I saw and examined the patient.  I reviewed the resident's note.  I agree with the findings and plan of care as documented in the resident's note.  Any exceptions/additions are edited/noted.    Kaleen Mask, MD 09/27/2010, 11:01 AM

## 2010-09-22 NOTE — Nurses Notes (Signed)
Pt awake upon entering room. A+O x4. Pulses 3/3. No edema noted. Left peripheral IV infusing NS at 75/hr. Insertion site intact, no redness noted. Lung sounds clear and diminished. Heart sounds audible. SaO2 95 on RA. Bowel sounds active.Abd firm, LLQ tender. Strength strong and equal in upper and lower extremities. Pt c/o pain in abd, rated 7/10. Not time to receive prn Dilaudid. Told to rest and tried to make comfortable. Told pt to call for assistance. Will continue to monitor.

## 2010-09-22 NOTE — Care Management Notes (Addendum)
Care Coordinator/Social Work Plan    Patient Name: Spencer [name]  DOB: [birth year]  Age: 44  Account Number: [id number]  MR Number: [id number]    Assessment Information    100. Adult Admission Assessment  Created By Trudee Kuster Date/Time 2010-09-22 15:37:13.133    Patient Assessment & Education    Level of Care    Met with pt/family/other and provided education on OBS patient class    CCC/MSW met with pt/family/other and provided education on    On role of the Clinical Care Coordinator  On role with discharge/transitional planning,  How to contact Care Management Department, [facility] and MSW.    CCC/MSW reviewed with patient/family/other    Transportation plan family  Guardianship:    Advanced Directive Completed:    Yes- Whom and contact number wife/ [employer]  Communications:    Primary Language:    English  Living Arrangments    Prior to admission housing arrangements:    With Spouse  With Children  Support System:    Agencies the patient was using prior to admission:    None  Agencies, Pharmacy, Dialysis and PCP Active Prior to Admission:    Does the patient have prescription coverage?    Yes- Commercial thru insurance    Patient's Pharmacy    Name & Phone Number [facility] [place]    Primary Care Physician    Practice/MD Name & Phone Number Dr. Resa Miner  Case Discussed with:    Case Discussed with Patient    Yes    Case Discussed with Medical Team (MD, RN, OT, NP, PA)    Yes, Name and profession     Was the Patient and/or caregiver(s) able to verbalize understanding of the disch  arge plan?    Yes     Additional Information    Notes:  HD#1 44 yr old male in observation with c/o of abdominal pain. Possible history   of Crohns. Presently on IVF, NPO. D/C plan is to return home with family when st  able. CCC will continue to reassess.    140. Medical Necessity and Level of Care  Created By Trudee Kuster Date/Time 2010-09-22 15:38:15.620    Medical Necessity and Level Of Care Notification    Level of Care    Met  with pt/family/other and provided education on OBS patient status

## 2010-09-22 NOTE — Nurses Notes (Signed)
Pain  D:  Pt c/o pain in abd, rated 6/10  A:  100mg  Ultram given as ordered by Bosworth Poot  R:  Will reassess pain

## 2010-09-22 NOTE — Nurses Notes (Signed)
Pain  D:  Pt c/o abd pain, rated 6/10  A:  1 tab Norco given as ordered by Rabbani  R:  Will reassess pain

## 2010-09-23 ENCOUNTER — Observation Stay (HOSPITAL_BASED_OUTPATIENT_CLINIC_OR_DEPARTMENT_OTHER)

## 2010-09-23 LAB — BASIC METABOLIC PANEL
ANION GAP: 6 mmol/L (ref 5–16)
BUN/CREAT RATIO: 8 (ref 6–22)
BUN: 7 mg/dL — ABNORMAL LOW (ref 8–26)
CALCIUM: 8.8 mg/dL (ref 8.5–10.4)
CARBON DIOXIDE: 29 mmol/L (ref 23–33)
CHLORIDE: 103 mmol/L (ref 96–111)
CREATININE: 0.86 mg/dL (ref 0.62–1.27)
ESTIMATED GLOMERULAR FILTRATION RATE: >59 ml/min/1.73m2 (ref >59)
GLUCOSE,NONFAST: 84 mg/dL (ref 65–139)
POTASSIUM: 3.8 mmol/L (ref 3.5–5.1)
SODIUM: 138 mmol/L (ref 136–145)

## 2010-09-23 LAB — CBC/DIFF
BASOPHILS: 0 % (ref 0–1)
BASOS ABS: 0 THOU/uL (ref 0.0–0.2)
EOS ABS: 0.34 THOU/uL — ABNORMAL HIGH (ref 0.1–0.3)
EOSINOPHIL: 5 % (ref 1–6)
HCT: 39.4 % — ABNORMAL LOW (ref 39.8–50.2)
HGB: 13.8 g/dL (ref 13.1–17.3)
LYMPHOCYTES: 54 % — ABNORMAL HIGH (ref 20–45)
LYMPHS ABS: 3.672 THOU/uL (ref 1.0–4.8)
MCH: 29.9 pg (ref 27.4–33.0)
MCHC: 35 g/dL (ref 31.6–35.5)
MCV: 85.3 fL (ref 82.0–99.0)
MONOCYTES: 2 % — ABNORMAL LOW (ref 4–13)
MONOS ABS: 0.136 THOU/uL (ref 0.1–0.9)
MPV: 7.8 FL (ref 7.4–10.4)
PLATELET COUNT: 204 THOU/uL (ref 140–450)
PMN ABS: 2.652 THOU/uL (ref 1.5–7.7)
PMN'S: 39 % — ABNORMAL LOW (ref 40–75)
RBC: 4.62 MIL/uL (ref 4.46–5.70)
RDW: 11.7 % (ref 10.2–14.0)
WBC: 6.8 THOU/uL (ref 3.5–11.0)

## 2010-09-23 LAB — URINALYSIS MACROSCOPIC WITH REFLEX TO MICROSCOPIC URINALYSIS (CULTURE NOT PERFORMED)
BILIRUBIN: NEGATIVE
GLUCOSE: NEGATIVE mg/dL
KETONES: NEGATIVE mg/dL
LEUKOCYTES: NEGATIVE
NITRITE: NEGATIVE
PH URINE: 5.5 (ref 5.0–8.0)
PROTEIN: NEGATIVE mg/dL
SPECIFIC GRAVITY, URINE: 1.008 (ref 1.005–1.030)
UROBILINOGEN: NORMAL mg/dL

## 2010-09-23 LAB — PHOSPHORUS: PHOSPHORUS: 4.3 mg/dL (ref 2.4–4.7)

## 2010-09-23 LAB — URINALYSIS, MICROSCOPIC
RBC'S: 3 /HPF (ref 0–4)
WBC'S: 1 /HPF (ref 0–1)

## 2010-09-23 LAB — MAGNESIUM: MAGNESIUM: 1.9 mg/dL (ref 1.7–2.5)

## 2010-09-23 LAB — CLOSTRIDIUM DIFFICILE TOXIN DETECTION

## 2010-09-23 MED ORDER — HYDROMORPHONE 2 MG/ML INJECTION SOLUTION
0.20 mg | INTRAMUSCULAR | Status: DC | PRN
Start: 2010-09-23 — End: 2010-09-25
  Administered 2010-09-23 – 2010-09-25 (×5): 0.2 mg via INTRAVENOUS
  Filled 2010-09-23 (×5): qty 1

## 2010-09-23 MED ORDER — POLYETHYLENE GLYCOL 3350 17 GRAM ORAL POWDER PACKET
17.0000 g | Freq: Every day | ORAL | Status: DC
Start: 2010-09-23 — End: 2010-09-26
  Administered 2010-09-23 – 2010-09-25 (×3): 17 g via ORAL
  Filled 2010-09-23 (×3): qty 1

## 2010-09-23 MED ORDER — PEG 3350-ELECTROLYTES 236 GRAM-22.74 GRAM-6.74 GRAM-5.86 GRAM SOLUTION
4.0000 L | Freq: Once | ORAL | Status: AC
Start: 2010-09-23 — End: 2010-09-23
  Administered 2010-09-23: 4000 mL via ORAL
  Filled 2010-09-23: qty 4000

## 2010-09-23 MED ORDER — HYDROMORPHONE (PF) 2 MG/ML INJECTION SYRINGE
0.2000 mg | INJECTION | INTRAMUSCULAR | Status: DC | PRN
Start: 2010-09-23 — End: 2010-09-23
  Administered 2010-09-23: 0.2 mg via INTRAVENOUS
  Filled 2010-09-23 (×2): qty 1

## 2010-09-23 NOTE — Progress Notes (Addendum)
[facility]  Medicine Progress Note    Spencer [name]  Date of service: 09/23/2010  Date of Admission:  09/21/2010    Hospital Day:  LOS: 2 days   Subjective: Patient still complaints of extreme pain in the left lower quadrant. He hasnt had a bowel movement since admission. Patient is passing gas. No nausea or vomiting    Vital Signs:  Temp (24hrs) Max:36.7 C (98.1 F)      Systolic (24hrs), Avg:129 mmHg, Min:113 mmHg, Max:143 mmHg    Diastolic (24hrs), Avg:81 mmHg, Min:74 mmHg, Max:88 mmHg    Temp  Avg: 36.6 C (97.8 F)  Min: 36.4 C (97.5 F)  Max: 36.7 C (98.1 F)  Pulse  Avg: 56.2   Min: 51   Max: 60   Resp  Avg: 19   Min: 18   Max: 20   SpO2  Avg: 96 %  Min: 95 %  Max: 97 %  Pain Score: 6  Fi02    I/O:  I/O last 24 hours:    Intake/Output Summary (Last 24 hours) at 09/23/10 0825  Last data filed at 09/23/10 6045   Gross per 24 hour   Intake   2250 ml   Output      0 ml   Net   2250 ml       I/O current shift:           Current facility-administered medications:  HYDROcodone-acetaminophen (NORCO) 5-325 mg per tablet  1 Tab Oral Q6H PRN   morphine 4 mg/mL injection  6 mg Intravenous Once   aspirin tablet 325 mg 325 mg Oral Daily   esomeprazole (NEXIUM) capsule  40 mg Oral QAM   metoprolol (LOPRESSOR) tablet  25 mg Oral 2x/day   simvastatin (ZOCOR) tablet  20 mg Oral QPM   enoxaparin (LOVENOX) 40 mg/0.4 mL SubQ injection  40 mg Subcutaneous Q24H   NS premix infusion   Intravenous Continuous   traMADol (ULTRAM) tablet  100 mg Oral Q4H PRN   nitroglycerin (NITROSTAT) sublingual tablet  0.3 mg Sublingual Q5 Min PRN         Allergies   Allergen Reactions   . Nkda (No Known Drug Allergies)          Physical Exam:  General: appears in good health and mild distress  Eyes: Conjunctiva clear., Pupils equal and round.   Lungs: Clear to auscultation bilaterally.   Cardiovascular: regular rate and rhythm  Abdomen: soft, tender in the abdomen, right side, bowel sounds present   Extremities: No cyanosis or edema  Skin: Skin warm and dry  Neurologic: Grossly normal    Labs:  Lab Results for Last 24 Hours:    Results for orders placed during the hospital encounter of 09/21/10 (from the past 24 hour(s))   CBC/DIFF    Collection Time    09/23/10  2:15 AM   Component Value Range   . WBC 6.8  3.5 - 11.0 (THOU/uL)   . RBC 4.62  4.46 - 5.70 (MIL/uL)   . HGB 13.8  13.1 - 17.3 (g/dL)   . HCT 39.4 (*) 39.8 - 50.2 (%)   . MCV 85.3  82.0 - 99.0 (fL)   . MCH 29.9  27.4 - 33.0 (pg)   . MCHC 35.0  31.6 - 35.5 (g/dL)   . RDW 11.7  10.2 - 14.0 (%)   . PLATELET COUNT 204  140 - 450 (THOU/uL)   . MPV 7.8  7.4 -  10.4 (FL)   . PMN'S 39 (*) 40 - 75 (%)   . PMN ABS 2.652  1.5 - 7.7 (THOU/uL)   . LYMPHOCYTES 54 (*) 20 - 45 (%)   . LYMPHS ABS 3.672  1.0 - 4.8 (THOU/uL)   . MONOCYTES 2 (*) 4 - 13 (%)   . MONOS ABS 0.136  0.1 - 0.9 (THOU/uL)   . EOSINOPHIL 5  1 - 6 (%)   . EOS ABS 0.340 (*) 0.1 - 0.3 (THOU/uL)   . BASOPHILS 0  0 - 1 (%)   . BASOS ABS 0.000  0.0 - 0.2 (THOU/uL)   . RBC MORPHOLOGY        Value: No significant types of abnormal RBC and or Platelet morphology noted      in microscopic review.   BASIC METABOLIC PANEL, NON-FASTING    Collection Time    09/23/10  2:15 AM   Component Value Range   . SODIUM 138  136 - 145 (mmol/L)   . POTASSIUM 3.8  3.5 - 5.1 (mmol/L)   . CHLORIDE 103  96 - 111 (mmol/L)   . CARBON DIOXIDE 29  23 - 33 (mmol/L)   . ANION GAP 6  5 - 16 (mmol/L)   . CREATININE 0.86  0.62 - 1.27 (mg/dL)   . ESTIMATED GLOMERULAR FILTRATION RATE >59  >59 (ml/min/1.43m2)   . GLUCOSE,NONFAST 84  65 - 139 (mg/dL)   . BUN 7 (*) 8 - 26 (mg/dL)   . BUN/CREAT RATIO 8  6 - 22    . CALCIUM 8.8  8.5 - 10.4 (mg/dL)   MAGNESIUM    Collection Time    09/23/10  2:15 AM   Component Value Range   . MAGNESIUM 1.9  1.7 - 2.5 (mg/dL)   PHOSPHORUS    Collection Time    09/23/10  2:15 AM   Component Value Range   . PHOSPHORUS 4.3  2.4 - 4.7 (mg/dL)         Radiology:  Xray KUB: No signs of obstruction and perforation       PT/OT: No    Consults: gastroenterology    Hardware (lines, foley's, tubes): none    Assessment/ Plan:     Patient with left lower quadrant pain  Past records show a colonoscopy with evidence of non specific chronic inflammation in the ileocolic region  -will Xray KUB to rule out obstruction  -plan for colonoscopy tomorrow  Golytely preparation  -NPO at midnight  -Will go for small bowel follow through after colonoscopy tomorrow   -Pain management with PRN dilaudid  -Urinalysis    DVT/PE Prophylaxis: Lovenox    Disposition Planning: Home discharge      Cyndy Freeze, MD 09/23/2010, 8:25 AM      I saw and examined the patient.  I reviewed the resident's note.  I agree with the findings and plan of care as documented in the resident's note.  Any exceptions/additions are edited/noted.  Intractable abdominal pain. Unable to tolerate with oral pain medications. Resumed IV dilaudid. Unable to tolerate diet. Schedule colonoscopy.  Asencion Noble, MD 09/23/2010, 10:55 PM

## 2010-09-23 NOTE — Nurses Notes (Signed)
Alert and oriented, vitals stable. Lung sounds clear, sats 96% on RA. Abdomen round, firm, tender to LLQ. MIVF infusing. Pt has been NPO since 0000 for small bowel follow through. Stool sample still needs collected. Pt tolerating full liquid diet. Will continue to monitor

## 2010-09-23 NOTE — Nurses Notes (Signed)
Pt awake upon entering room. A+O x4. Pulses 3/3. Left peripheral IV infusing NS at 75/hr. Lung sounds clear. Heart sounds audible. SaO2 was 94% on RA. Bowel sounds active. Strength stong in both upper and lower extremities. Pt c/o LLQ abd pain and back pain, rated 7/10. Refused medical intervention at this time. Stated that the Norco and Ultram did not help and he wanted to wait until the MD ordered something new. Pt told to call for assistance. Will continue to monitor.

## 2010-09-23 NOTE — Nurses Notes (Signed)
Pain  D:  Pt c/o pain in abd and back, rated 8/10  A:  Pt given 2mg  Dilaudid as ordered by Rabbini  R:  Will reassess pain

## 2010-09-23 NOTE — Nurses Notes (Signed)
2200: Pt finished golytely prep. Will continue to monitor.

## 2010-09-23 NOTE — Nurses Notes (Signed)
2329: Patient reporting 7/10 abdominal pain. Medicated with 1 Norco. Will continue to monitor

## 2010-09-24 ENCOUNTER — Other Ambulatory Visit (HOSPITAL_COMMUNITY): Payer: Self-pay | Admitting: GASTROENTEROLOGY

## 2010-09-24 ENCOUNTER — Encounter (HOSPITAL_COMMUNITY)
Admission: EM | Disposition: A | Discharge status: Home / Self Care | Payer: Self-pay | Source: Home / Self Care | Attending: Emergency Medicine

## 2010-09-24 HISTORY — PX: COLONOSCOPY: WVUENDOPRO10

## 2010-09-24 LAB — CBC/DIFF
BASOPHILS: 1 % (ref 0–1)
BASOS ABS: 0.05 THOU/uL (ref 0.0–0.2)
EOS ABS: 0.219 THOU/uL (ref 0.1–0.3)
EOSINOPHIL: 3 % (ref 1–6)
HCT: 42.2 % (ref 39.8–50.2)
HGB: 14.7 g/dL (ref 13.1–17.3)
LYMPHOCYTES: 43 % (ref 20–45)
LYMPHS ABS: 3.13 THOU/uL (ref 1.0–4.8)
MCH: 29.6 pg (ref 27.4–33.0)
MCHC: 34.9 g/dL (ref 31.6–35.5)
MCV: 85 fL (ref 82.0–99.0)
MONOCYTES: 7 % (ref 4–13)
MONOS ABS: 0.492 THOU/uL (ref 0.1–0.9)
MPV: 7.7 FL (ref 7.4–10.4)
NRBC'S: 0 /100{WBCs}
PLATELET COUNT: 217 THOU/uL (ref 140–450)
PMN ABS: 3.33 THOU/uL (ref 1.5–7.7)
PMN'S: 46 % (ref 40–75)
RBC: 4.97 MIL/uL (ref 4.46–5.70)
RDW: 11.6 % (ref 10.2–14.0)
WBC: 7.2 THOU/uL (ref 3.5–11.0)

## 2010-09-24 LAB — BASIC METABOLIC PANEL
ANION GAP: 10 mmol/L (ref 5–16)
BUN/CREAT RATIO: 8 (ref 6–22)
BUN: 7 mg/dL — ABNORMAL LOW (ref 8–26)
CALCIUM: 9.1 mg/dL (ref 8.5–10.4)
CARBON DIOXIDE: 26 mmol/L (ref 23–33)
CHLORIDE: 102 mmol/L (ref 96–111)
CREATININE: 0.91 mg/dL (ref 0.62–1.27)
ESTIMATED GLOMERULAR FILTRATION RATE: >59 ml/min/1.73m2 (ref >59)
GLUCOSE,NONFAST: 92 mg/dL (ref 65–139)
POTASSIUM: 3.4 mmol/L — ABNORMAL LOW (ref 3.5–5.1)
SODIUM: 138 mmol/L (ref 136–145)

## 2010-09-24 LAB — PHOSPHORUS: PHOSPHORUS: 3.6 mg/dL (ref 2.4–4.7)

## 2010-09-24 LAB — IGE, TOTAL IMMUNOGLOBULIN E: TOTAL IMMUNOGLOBULIN IgE: 36.9 [IU]/mL (ref <100.0)

## 2010-09-24 LAB — MAGNESIUM: MAGNESIUM: 2 mg/dL (ref 1.7–2.5)

## 2010-09-24 SURGERY — COLONOSCOPY
Anesthesia: IV Sedation | Laterality: Left | Wound class: Clean Contaminated Wounds-The respiratory, GI, Genital, or urinary

## 2010-09-24 MED ORDER — FENTANYL (PF) 50 MCG/ML INJECTION SOLUTION
INTRAMUSCULAR | Status: AC
Start: 2010-09-24 — End: 2010-09-24
  Filled 2010-09-24: qty 4

## 2010-09-24 MED ORDER — MIDAZOLAM 1 MG/ML INJECTION SOLUTION
2.00 mg | INTRAMUSCULAR | Status: DC | PRN
Start: 2010-09-24 — End: 2010-09-24
  Administered 2010-09-24 (×2): 1 mg via INTRAVENOUS
  Administered 2010-09-24: 2 mg via INTRAVENOUS
  Administered 2010-09-24: 1 mg via INTRAVENOUS

## 2010-09-24 MED ORDER — ONDANSETRON HCL (PF) 4 MG/2 ML INJECTION SOLUTION
4.0000 mg | Freq: Three times a day (TID) | INTRAMUSCULAR | Status: DC | PRN
Start: 2010-09-24 — End: 2010-09-26
  Administered 2010-09-24: 4 mg via INTRAVENOUS
  Filled 2010-09-24: qty 2

## 2010-09-24 MED ORDER — FENTANYL (PF) 50 MCG/ML INJECTION SOLUTION
50.00 ug | INTRAMUSCULAR | Status: DC | PRN
Start: 2010-09-24 — End: 2010-09-24
  Administered 2010-09-24: 50 ug via INTRAVENOUS
  Administered 2010-09-24 (×2): 25 ug via INTRAVENOUS

## 2010-09-24 SURGICAL SUPPLY — 42 items
CANNULA DUAL NEEDLELESS 303390_IV 100EA/BX (IV TUBING & ACCESSORIES) ×2
CANNULA INJ 17GA NDLS SYRG BLUNT STRL LF  10ML BD INTRLNK PLASTIC BXTR INTLNK ABT LS MCGAW SAFELINE (IV TUBING & ACCESSORIES) ×1 IMPLANT
CANNULA INJ 20GA 17GA 2 DEV HUB CAP BLUNT STRL LF  RD GRN BD TWINPAK STL PLASTIC (IV TUBING & ACCESSORIES) ×2 IMPLANT
CANNULA NASAL 14FT ANGL FLXB LIP PLATE CRSH RS LUM TUBE NFLR TP ADULT ARLF UCIT STD CURVE LF  DISP (CANNULA) IMPLANT
CANNULA NASAL 7FT ANGL FLXB LIP PLATE CRSH RS LUM TUBE FLR TIP ADULT ARLF UCIT STD CURVE LF  DISP (CANNULA) IMPLANT
CATH CRE 7.5FR 12-15MM 5.5CM 240CM WG BAL DIL ESOPH COLON PYL PEBAX STRL LF  2.8MM WRK CHNL F/G (Dilators) IMPLANT
CATH CRE 7.5FR 15-18MM 5.5CM 240CM WG BAL DIL ESOPH COLON PYL PEBAX STRL LF  2.8MM WRK CHNL F/G (Dilators) IMPLANT
CATH ELHMST GLD PROBE 10FR 300CM BIPOLAR RND DIST TIP STD CONN FIRM SHAFT HMGLD STRL DISP 3.7MM MN (DIAGNOSTIC) IMPLANT
CATH ELHMST GLD PROBE 7FR 300CM BIPOLAR RND DIST TIP STD CONN FIRM SHAFT HMGLD STRL DISP 2.8MM MN (DIAGNOSTIC) IMPLANT
CLIP HMST RADOPQ PRELD STRL DISP RSL 235CM 2.8MM 11MM OPN (SURGICAL INSTRUMENTS) IMPLANT
CONV USE ITEM 343591 - SOLIDIFY FLUID 1500ML DSPNSR L_Q TX SOLIDIFY SFTP LTS+ DISP (STER) ×1 IMPLANT
DEVICE SPEC RETR TLN 2.5MM 160CM 4 PRONG GRASPER INWRD HOOK SHEATH SS DISP (ENDOSCOPIC SUPPLIES) IMPLANT
DISCONTINUED NO SUB - JELLY LUB DYNALUBE BCTRST WATER SOL NGRS PKT STRL 5GM LF (WOUND CARE SUPPLY) ×2 IMPLANT
DISCONTINUED USE ITEM 339015 - CONTAINR STRL 10% NEUT BF FRMLN POLYPROP GRAD LEAK RST ORNG PREFL SCREW CAP FSHR HLTHCR PRTCL GRN (CHEM) ×4 IMPLANT
DISCONTINUED USE ITEM 82101 - TUBING OXYGEN 50/CS 001302 (TUBE/TUBING & SUCTION SUPPLIES) IMPLANT
ELECTRODE PATIENT RTN 9FT VLAB C30- LB RM PHSV ACRL FOAM CORD NONIRRITATE NONSENSITIZE ADH STRP (CAUTERY SUPPLIES) IMPLANT
FILTER PROBE SIDE FIRE 2.3MM 20132-217 INTEGRATED BX/10 (Dilators) IMPLANT
FORCEPS BIOPSY HOT 240CM 2.2MM RJ 4 +2.8MM DISP (INSTRUMENTS)
FORCEPS BIOPSY HOT 240CM 2.2MM RJ 4 +2.8MM DISPO (SURGICAL INSTRUMENTS) IMPLANT
FORCEPS BIOPSY MICROMESH TTH STREAMLINE CATH 240CM 2.4MM RJ 4 SS LRG CPC STRL DISP ORNG 2.8MM WRK (GUIDING) ×1 IMPLANT
FORCEPS BIOPSY NEEDLE 240CM 2.2MM RJ 4 2.8MM STD CPC STRL DISP ORNG (SURGICAL INSTRUMENTS) IMPLANT
INK ENDOSCOPIC MARKER SPOT 5ML GIS44 STERILE 10EA/BX (MISCELLANEOUS PT CARE ITEMS) IMPLANT
ISNARE SYSTEM HEX SNARE 00711088 W/25GA NDL 5EA/BX (BASKET) IMPLANT
LINER SUCT RD CRD MEDIVAC TW LOCK LID SHTOF VALVE CAN FILTER 1500CC LF  DISP (Suction) ×1 IMPLANT
LOOP ENDO DETACH 20MM MAJ340 (GU) IMPLANT
LOOP HLDR DTCH AUTOCLAV 30MM SURG NYL PLPCTM NONST LF  DISP (ENDOSCOPIC SUPPLIES) IMPLANT
NEEDLE SCLRTX 25GA 2.3MM OPTC TIP SHTH STRL LF DISP YW (NEEDLES & SYRINGE SUPPLIES) IMPLANT
NEEDLE SCLRTX 25GA 2.5MM INJ LL SPRG LD HNDL SHEATH LF  CRLK 230CM 5MM SS TFLN (Other Miscellaneous) IMPLANT
PROBE ESURG 220CM 2.3MM FIAPC FLXB STR FIRE STRL DISP (CAUTERY SUPPLIES) IMPLANT
SET IV UNIV EXT DUAL Y W/SLIDE CLMP NEEDLELESS 2C6612 (IV TUBING & ACCESSORIES) IMPLANT
SET TUBING ERBEFLOW CAP 24 HR ENDOSCP PUMP STRL DISP ORDER 10EA (GENE) IMPLANT
SNARE 9MM 230CM 2.4MM EXACTO COLD BRD WRE CLEAN CUT ENDOS PLC RESCT STRL LF  DISP (ENDOSCOPIC SUPPLIES) IMPLANT
SNARE SM OVAL 240CM 2.4MM SNS LOOP SHRTHRW FLXB ENDOS PLPCTM 13MM STRL LF  DISP (DIAGNOSTIC) IMPLANT
SOLIDIFY FLUID 1500ML DSPNSR L_Q TX SOLIDIFY SFTP LTS+ DISP (STER) ×1
SYRINGE 5ML LF  STRL ST GRAD MED POLYPROP DISP (NEEDLES & SYRINGE SUPPLIES) ×1 IMPLANT
SYRINGE INFLAT ALN II GA STRL DISP 60ML (NEEDLES & SYRINGE SUPPLIES) IMPLANT
SYRINGE LL 3ML LF  STRL GRAD N-PYRG DEHP-FR PVC FREE MED DISP CLR (NEEDLES & SYRINGE SUPPLIES) ×1 IMPLANT
SYRINGE LL 50ML LF  STRL GRAD N-PYRG DEHP-FR PVC FREE MED DISP CLR (NEEDLES & SYRINGE SUPPLIES) IMPLANT
TRAP MUCOUS SPEC 10FR MST4000 (ANETHESIA SUPPLIES) IMPLANT
TUBING SUCT CLR 20FT 9/32IN MEDIVAC NCDTV M/M CONN STRL LF (Suction) IMPLANT
TUBING SUCT CLR 6FT 3/16IN MEDIVAC MXGR MALE TO MALE CONN NCDTV STRL LF (Suction) ×1 IMPLANT
WATER STRL 500ML PLASTIC PR BTL LF (SOLUTIONS) ×1 IMPLANT

## 2010-09-24 NOTE — Nurses Notes (Signed)
Procedure completed. Pt tolerated. No change in abdomen.

## 2010-09-24 NOTE — Nurses Notes (Signed)
Spoke with Dr. Katheren Puller, patient requesting information on procedure today.  No results at this time, will be in to speak with patient when results are available.

## 2010-09-24 NOTE — Nurses Notes (Signed)
Pt in for procedure. A/Ox3, pleasant and cooperative. Abdomen soft, non-distended. Pt c/o discomfort with palpation across abdomen. Pt states no family present.

## 2010-09-24 NOTE — Nurses Notes (Signed)
Patient complains of headache medicated with 100mg  ultram by mouth. Continue to monitor.

## 2010-09-24 NOTE — Nurses Notes (Signed)
Patient medicated with .2mg  Dilaudid IV per PRN orders for abdominal pain. No further needs at this time will monitor.

## 2010-09-24 NOTE — Progress Notes (Addendum)
[facility]  Medicine Progress Note    Bing Neighbors Graddy  Date of service: 09/24/2010  Date of Admission:  09/21/2010    Hospital Day:  LOS: 3 days   Subjective: Patient still complaints of  pain in the left lower quadrant 4-5/10.had numerous BM after golytely yesterday, npo for colonoscopy today    Vital Signs:  Temp (24hrs) Max:37 C (98.6 F)      Systolic (24hrs), Avg:129 mmHg, Min:109 mmHg, Max:155 mmHg    Diastolic (24hrs), Avg:80 mmHg, Min:57 mmHg, Max:105 mmHg    Temp  Avg: 36.6 C (97.9 F)  Min: 36.1 C (97 F)  Max: 37 C (98.6 F)  Pulse  Avg: 59   Min: 54   Max: 68   Resp  Avg: 18   Min: 18   Max: 18   SpO2  Avg: 98.2 %  Min: 97 %  Max: 99 %  Pain Score: 6  Fi02    I/O:  I/O last 24 hours:    Intake/Output Summary (Last 24 hours) at 09/24/10 1209  Last data filed at 09/24/10 0900   Gross per 24 hour   Intake   4265 ml   Output      0 ml   Net   4265 ml       I/O current shift:  10/28 0800 - 10/28 1559  In: 150 [I.V.:150]  Out: -         Current facility-administered medications:  ondansetron (ZOFRAN) 2 mg/mL injection  4 mg Intravenous Q8H PRN   polyethylene glycol (MIRALAX) oral packet  17 g Oral Daily   HYDROmorphone (DILAUDID) 2 mg/mL injection  0.2 mg Intravenous Q4H PRN   morphine 4 mg/mL injection  6 mg Intravenous Once   aspirin tablet 325 mg 325 mg Oral Daily   esomeprazole (NEXIUM) capsule  40 mg Oral QAM   metoprolol (LOPRESSOR) tablet  25 mg Oral 2x/day   simvastatin (ZOCOR) tablet  20 mg Oral QPM   enoxaparin (LOVENOX) 40 mg/0.4 mL SubQ injection  40 mg Subcutaneous Q24H   NS premix infusion   Intravenous Continuous   traMADol (ULTRAM) tablet  100 mg Oral Q4H PRN   nitroglycerin (NITROSTAT) sublingual tablet  0.3 mg Sublingual Q5 Min PRN         Allergies   Allergen Reactions   . Nkda (No Known Drug Allergies)          Physical Exam:  General: appears in good health and mild distress  Eyes: Conjunctiva clear., Pupils equal and round.    Lungs: Clear to auscultation bilaterally.   Cardiovascular: regular rate and rhythm  Abdomen: soft, tender in the abdomen, right side, bowel sounds present  Extremities: No cyanosis or edema  Skin: Skin warm and dry  Neurologic: Grossly normal    Labs:  Lab Results for Last 24 Hours:    Results for orders placed during the hospital encounter of 09/21/10 (from the past 24 hour(s))   URINALYSIS W/CULTURE (INPT ROUTINE)    Collection Time    09/23/10 12:27 PM   Component Value Range   . CHARACTER CLEAR     . COLOR LIGHT YELLOW     . SPECIFIC GRAVITY, URINE 1.008  1.005 - 1.030    . GLUCOSE NEGATIVE  NEGATIVE (mg/dL)   . BILIRUBIN NEGATIVE  NEGATIVE    . KETONES NEGATIVE  NEGATIVE (mg/dL)   . BLOOD SMALL (*) NEGATIVE    . PH URINE 5.5  5.0 -  8.0    . PROTEIN NEGATIVE  NEGATIVE (mg/dL)   . UROBILINOGEN NORMAL  0.2 (mg/dL)   . NITRITE NEGATIVE  NEGATIVE    . LEUKOCYTES NEGATIVE  NEGATIVE    MICROSCOPIC URINALYSIS    Collection Time    09/23/10 12:27 PM   Component Value Range   . RBC'S    0 - 4 (/HPF)    Value: 3  MICROSCOPIC ANALYSIS PERFORMED BY AUTOMATED METHOD   . WBC'S 1  0 - 1 (/HPF)   . BACTERIA OCCASIONAL OR LESS  OCL^OCCASIONAL OR LESS    . Squamous Epithelial RARE  (/LPF)   . Mucous LIGHT  (/LPF)   C. DIFFICILE DETECTION    Collection Time    09/23/10  5:21 PM   Component Value Range   . SPECIMEN DESCRIPTION STOOL     . SPECIAL REQUESTS NONE     . C. DIFF TOXIN ASSAY        Value: Negative for toxigenic C. difficile. A negative result does not exclude C. diff DNA or antigen at levels below assay detection limits. False negative results may occur due to inadequate collection, transport or storage of specimens.        The test performance characteristics were verified by [facility]. These tests have not been validated for children less than 75 years of age. This test will not detect strains of C. difficile that do not contain the tcdB gene. Results should be interpreted in conjunction with other laboratory and clinical data.      This test algorithm utilizes FDA-cleared assays for glutamate dehydrogenase (immunoassay) and toxin gene by PCR amplification using the Cepheid GeneXpert C. Diff Assay on the GeneXpert Dx System.   Marland Kitchen REPORT STATUS        Value: 09/23/2010      FINAL   CBC/DIFF    Collection Time    09/24/10  2:15 AM   Component Value Range   . WBC 7.2  3.5 - 11.0 (THOU/uL)   . RBC 4.97  4.46 - 5.70 (MIL/uL)   . HGB 14.7  13.1 - 17.3 (g/dL)   . HCT 42.2  39.8 - 50.2 (%)   . MCV 85.0  82.0 - 99.0 (fL)   . MCH 29.6  27.4 - 33.0 (pg)   . MCHC 34.9  31.6 - 35.5 (g/dL)   . RDW 11.6  10.2 - 14.0 (%)   . PLATELET COUNT 217  140 - 450 (THOU/uL)   . MPV 7.7  7.4 - 10.4 (FL)   . PMN'S 46  40 - 75 (%)   . PMN ABS 3.330  1.5 - 7.7 (THOU/uL)   . LYMPHOCYTES 43  20 - 45 (%)   . LYMPHS ABS 3.130  1.0 - 4.8 (THOU/uL)   . MONOCYTES 7  4 - 13 (%)   . MONOS ABS 0.492  0.1 - 0.9 (THOU/uL)   . EOSINOPHIL 3  1 - 6 (%)   . EOS ABS 0.219  0.1 - 0.3 (THOU/uL)   . BASOPHILS 1  0 - 1 (%)   . BASOS ABS 0.050  0.0 - 0.2 (THOU/uL)   . NRBC'S 0  0 (/100WBC)   BASIC METABOLIC PANEL, NON-FASTING    Collection Time    09/24/10  2:15 AM   Component Value Range   . SODIUM 138  136 - 145 (mmol/L)   . POTASSIUM 3.4 (*) 3.5 - 5.1 (mmol/L)   . CHLORIDE 102  96 - 111 (  mmol/L)   . CARBON DIOXIDE 26  23 - 33 (mmol/L)   . ANION GAP 10  5 - 16 (mmol/L)   . CREATININE 0.91  0.62 - 1.27 (mg/dL)   . ESTIMATED GLOMERULAR FILTRATION RATE >59  >59 (ml/min/1.104m2)   . GLUCOSE,NONFAST 92  65 - 139 (mg/dL)   . BUN 7 (*) 8 - 26 (mg/dL)   . BUN/CREAT RATIO 8  6 - 22    . CALCIUM 9.1  8.5 - 10.4 (mg/dL)   MAGNESIUM    Collection Time    09/24/10  2:15 AM    Component Value Range   . MAGNESIUM 2.0  1.7 - 2.5 (mg/dL)   PHOSPHORUS    Collection Time    09/24/10  2:15 AM   Component Value Range   . PHOSPHORUS 3.6  2.4 - 4.7 (mg/dL)         Radiology:  Xray KUB: No signs of obstruction and perforation      PT/OT: No    Consults: gastroenterology    Hardware (lines, foley's, tubes): none    Assessment/ Plan:   44 y.o. male admitted with 1 day history of L.L.Q abdominal pain with one episode of loose stools.   Past records show a colonoscopy with evidence of non specific chronic inflammation in the ileocolic region    - NPO for colonoscopy today  - KUB- no obstruction  -Will go for small bowel follow through after colonoscopy tomorrow   -Pain management with PRN dilaudid  - Stool sent for C diff, culture    DVT/PE Prophylaxis: Lovenox    Disposition Planning: Home discharge      Maurine Cane, MD        I saw and examined the patient.  I reviewed the resident's note.  I agree with the findings and plan of care as documented in the resident's note.  Any exceptions/additions are edited/noted.    Asencion Noble, MD 09/24/2010, 4:43 PM

## 2010-09-24 NOTE — Nurses Notes (Signed)
Pt complaining of 6/10 LLQ abdominal pain. Medicated with .2mg  dilaudid at 0446. Will continue to monitor.

## 2010-09-25 LAB — BASIC METABOLIC PANEL
ANION GAP: 7 mmol/L (ref 5–16)
BUN/CREAT RATIO: 7 (ref 6–22)
BUN: 7 mg/dL — ABNORMAL LOW (ref 8–26)
CALCIUM: 8.8 mg/dL (ref 8.5–10.4)
CARBON DIOXIDE: 27 mmol/L (ref 23–33)
CHLORIDE: 101 mmol/L (ref 96–111)
CREATININE: 0.98 mg/dL (ref 0.62–1.27)
ESTIMATED GLOMERULAR FILTRATION RATE: >59 ml/min/1.73m2 (ref >59)
GLUCOSE,NONFAST: 114 mg/dL (ref 65–139)
POTASSIUM: 3.7 mmol/L (ref 3.5–5.1)
SODIUM: 135 mmol/L — ABNORMAL LOW (ref 136–145)

## 2010-09-25 LAB — CBC
HCT: 38.9 % — ABNORMAL LOW (ref 39.8–50.2)
HGB: 13.9 g/dL (ref 13.1–17.3)
MCH: 30.4 pg (ref 27.4–33.0)
MCHC: 35.8 g/dL — ABNORMAL HIGH (ref 31.6–35.5)
MCV: 85 fL (ref 82.0–99.0)
MPV: 7.6 FL (ref 7.4–10.4)
PLATELET COUNT: 206 THOU/uL (ref 140–450)
RBC: 4.58 MIL/uL (ref 4.46–5.70)
RDW: 11.6 % (ref 10.2–14.0)
WBC: 6.5 THOU/uL (ref 3.5–11.0)

## 2010-09-25 MED ORDER — ACETAMINOPHEN 325 MG TABLET
650.0000 mg | ORAL_TABLET | Freq: Once | ORAL | Status: AC
Start: 2010-09-25 — End: 2010-09-25
  Administered 2010-09-25: 650 mg via ORAL
  Filled 2010-09-25: qty 2

## 2010-09-25 MED ORDER — GABAPENTIN 400 MG CAPSULE
400.00 mg | ORAL_CAPSULE | Freq: Every evening | ORAL | Status: DC
Start: 2010-09-25 — End: 2015-09-15

## 2010-09-25 MED ORDER — ESOMEPRAZOLE MAGNESIUM 40 MG CAPSULE,DELAYED RELEASE
40.00 mg | DELAYED_RELEASE_CAPSULE | Freq: Every morning | ORAL | Status: DC
Start: 2010-09-25 — End: 2011-04-19

## 2010-09-25 NOTE — Discharge Summary (Addendum)
[facility]                                              DISCHARGE SUMMARY      PATIENT NAME:  Spencer [name], Spencer [name]   MRN:  [id number]  DOB:  03-11-1966    ADMISSION DATE:  09/21/2010  DISCHARGE DATE:  09/25/2010    ATTENDING PHYSICIAN: Seward Meth MD  PRIMARY CARE PHYSICIAN: [name], DO     ADMISSION DIAGNOSIS: Crohn's Exacerbation  DISCHARGE DIAGNOSIS: Non specific abdominal pain      Chronic  Problems    Crohn's disease         Date Noted: 06/14/2010      IBS (irritable bowel syndrome)         Date Noted: 06/14/2010      GERD (gastroesophageal reflux disease)         Date Noted: 06/14/2010      PAD (peripheral artery disease)         Date Noted: 06/03/2010      Hypertriglyceridemia         Date Noted: 06/03/2010      Anginal pain         Date Noted: 06/03/2010      CAD (coronary artery disease)         Date Noted: 06/03/2010      Stress test         Date Noted: 05/28/2010      S/P cardiac catheterization         Date Noted: 05/28/2010      Spells         Date Noted: 06/16/2008      Hyperlipidemia         Date Noted: 12/26/2007      Chest Pain         Date Noted: 12/23/2007      Borderline Diabetes Mellitus         Date Noted: 12/23/2007               No att. providers found                                                                                     DISCHARGE MEDICATIONS:  Discharge Medication List as of 09/25/10 12:09 PM      CONTINUE these medications which have CHANGED       esomeprazole magnesium (NEXIUM) 40 mg Oral Capsule, Delayed Release(E.C.)    take 1 Cap by mouth QAM., Disp-40 Cap, R-2, Print          CONTINUE these medications which have NOT CHANGED       Gabapentin (NEURONTIN) 400 mg Oral Capsule    take 400 mg by mouth every night., Historical Med        aspirin 325 mg Tab    take 325 mg by mouth Once a day., Historical Med        metoprolol (LOPRESSOR) 25 mg Tab    take 1 Tab by mouth Twice daily., Disp-60 Tab,  R-5, E-Rx         nitroglycerin (NITROSTAT) 0.4 mg Subl    by Sublingual route. for 3 doses over 15 minutes, Disp-25 Tab, R-3, E-Rx        simvastatin (ZOCOR) 20 mg Tab    take 1 Tab by mouth QPM., Disp-30 Tab, R-3, E-Rx          STOP taking these medications       naproxen (NAPROSYN) 250 mg Tab    Comments:     Reason for Stopping:                 DISCHARGE INSTRUCTIONS:     FLUORO SMALL BOWEL FOLLOW THRU   pkease get this test done before your appointment with gastroenterology     AMB CONSULT/REFERRAL GASTROENTEROLOGY     SCHEDULE FOLLOW-UP OUTSIDE PHYSICIAN   Follow-up in: 2 WEEKS    Followup reason: hospital discharge    Provider: PCP            REASON FOR HOSPITALIZATION AND HOSPITAL COURSE:  This is a 44 y.o., male  Who presented to ED with abdominal pain that started that morning in the left lower quadrant 10/10 in intensity radiating to the right side as well. Pain was associated with 1 episode of diarrhea. No vomiting. Patient denied any history of black stools. He had chills  but no fever. Patient has a history of Crohn's disease diagnosed at [facility] a few years ago.[facility] from [facility] showed colonoscopies with chronic non specific inflammation in ileocecal region. Early changes of Crohns was a possibility. He was not on any medications for Crohns Disease at home. Patients C diff, stool for occult blood, stool culture were negative. His CT abdomen was normal. ESR, CRP negative. Patient initially required IV dilaudid for pain control. He underwent colonoscopy here which showed normal mucosa. Numerous biopsies were obtained from the colon and the terminal ileum. Pathology is pending. Patient was started on regular diet after that which he tolerated well. His pain also improved. He was discharged in stable condition with instructions to follow up with his PCP and gastroenterology here. Patient was also advised to undergo small bowel follow through as outpatient  CONDITION ON DISCHARGE:   A. Ambulation: as tolerated  B. Self-care Ability: present  C. Cognitive Status alert and oriented X 3    DISCHARGE DISPOSITION:  Home discharge     cc: Primary Care Physician:  [name]  [facility] [address]  [place] [address]     UE:[id number] Physician:  Referral Self  No address on file.       Cyndy Freeze, MD    Asencion Noble, MD

## 2010-09-25 NOTE — Nurses Notes (Signed)
Discharge instructions given, verbalized understanding.  To be dc'd home with all belongings.  Reporting sinus h/a, Dr. Johna Roles aware as pt requested decongestant as he would take at home.  Received orders for Tylenol, given see MAR.

## 2010-09-25 NOTE — Progress Notes (Addendum)
[facility]  Medicine Progress Note    Spencer [name]  Date of service: 09/25/2010  Date of Admission:  09/21/2010    Hospital Day:  LOS: 4 days   Subjective: Patient pain is back to baseline. He tolerated normal diet last night    Vital Signs:  Temp (24hrs) Max:37 C (98.6 F)      Systolic (24hrs), Avg:117 mmHg, Min:106 mmHg, Max:153 mmHg    Diastolic (24hrs), Avg:66 mmHg, Min:57 mmHg, Max:87 mmHg    Temp  Avg: 36.7 C (98 F)  Min: 36.1 C (97 F)  Max: 37 C (98.6 F)  Pulse  Avg: 58.3   Min: 54   Max: 64   Resp  Avg: 19   Min: 18   Max: 20   SpO2  Avg: 96.8 %  Min: 93 %  Max: 100 %  Pain Score: 6  Fi02    I/O:  I/O last 24 hours:    Intake/Output Summary (Last 24 hours) at 09/25/10 0744  Last data filed at 09/25/10 0700   Gross per 24 hour   Intake   2325 ml   Output      0 ml   Net   2325 ml       I/O current shift:  10/29 0000 - 10/29 0759  In: 720 [P.O.:120; I.V.:600]  Out: -   Blood Sugars: Last Fingerstick:    No results found for this basename: glucosepoc           Current facility-administered medications:  ondansetron (ZOFRAN) 2 mg/mL injection  4 mg Intravenous Q8H PRN   polyethylene glycol (MIRALAX) oral packet  17 g Oral Daily   HYDROmorphone (DILAUDID) 2 mg/mL injection  0.2 mg Intravenous Q4H PRN   morphine 4 mg/mL injection  6 mg Intravenous Once   aspirin tablet 325 mg 325 mg Oral Daily   esomeprazole (NEXIUM) capsule  40 mg Oral QAM   metoprolol (LOPRESSOR) tablet  25 mg Oral 2x/day   simvastatin (ZOCOR) tablet  20 mg Oral QPM   enoxaparin (LOVENOX) 40 mg/0.4 mL SubQ injection  40 mg Subcutaneous Q24H   NS premix infusion   Intravenous Continuous   traMADol (ULTRAM) tablet  100 mg Oral Q4H PRN   nitroglycerin (NITROSTAT) sublingual tablet  0.3 mg Sublingual Q5 Min PRN         Allergies   Allergen Reactions   . Nkda (No Known Drug Allergies)          Physical Exam:  General: appears in good health and no distress  Eyes: Conjunctiva clear., Pupils equal and round.    Neck: No JVD or thyromegaly or lymphadenopathy  Lungs: Clear to auscultation bilaterally.   Cardiovascular: regular rate and rhythm  Abdomen: Soft, non-tender, Bowel sounds normal  Extremities: No cyanosis or edema  Skin: Skin warm and dry  Neurologic: Grossly normal  Psychiatric: Normal    Labs:  Lab Results for Last 24 Hours:  Results for orders placed during the hospital encounter of 09/21/10 (from the past 24 hour(s))   CBC    Collection Time    09/25/10  7:00 AM   Component Value Range   . WBC 6.5  3.5 - 11.0 (THOU/uL)   . RBC 4.58  4.46 - 5.70 (MIL/uL)   . HGB 13.9  13.1 - 17.3 (g/dL)   . HCT 38.9 (*) 39.8 - 50.2 (%)   . MCV 85.0  82.0 - 99.0 (fL)   . MCH 30.4  27.4 - 33.0 (pg)   . MCHC 35.8 (*) 31.6 - 35.5 (g/dL)   . RDW 11.6  10.2 - 14.0 (%)   . PLATELET COUNT 206  140 - 450 (THOU/uL)   . MPV 7.6  7.4 - 10.4 (FL)   BASIC METABOLIC PANEL, NON-FASTING    Collection Time    09/25/10  7:00 AM   Component Value Range   . SODIUM 135 (*) 136 - 145 (mmol/L)   . POTASSIUM 3.7  3.5 - 5.1 (mmol/L)   . CHLORIDE 101  96 - 111 (mmol/L)   . CARBON DIOXIDE 27  23 - 33 (mmol/L)   . ANION GAP 7  5 - 16 (mmol/L)   . CREATININE 0.98  0.62 - 1.27 (mg/dL)   . ESTIMATED GLOMERULAR FILTRATION RATE >59  >59 (ml/min/1.44m2)   . GLUCOSE,NONFAST 114  65 - 139 (mg/dL)   . BUN 7 (*) 8 - 26 (mg/dL)   . BUN/CREAT RATIO 7  6 - 22    . CALCIUM 8.8  8.5 - 10.4 (mg/dL)       Radiology:  Colonoscopy normal colon mucosa will check biopsies    Microbiology:  C diff negative    PT/OT: No    Consults: gastroenterology        Assessment/ Plan:     Abdominal pain with inability to tolerate food  -past records showed colonoscopy report with non specific chronic inflammation  -C Diff negative  -Stool for occult blood negative  -Colonoscopy grossly normal  -Will chase pathology report  -patient will undergo small bowel follow through as outpatient  -Will follow up with gastroenterology        DVT/PE Prophylaxis: Lovenox     Disposition Planning: Home discharge      Cyndy Freeze, MD 09/25/2010, 7:44 AM      I saw and examined the patient.  I reviewed the resident's note.  I agree with the findings and plan of care as documented in the resident's note.  Any exceptions/additions are edited/noted.    Asencion Noble, MD 09/25/2010, 11:22 PM

## 2010-09-26 LAB — ROUTINE STOOL CULTURE (INCLUDING E. COLI SHIGA TOXIN): E COLI SHIGA TOXIN: NEGATIVE

## 2010-09-27 ENCOUNTER — Encounter (HOSPITAL_COMMUNITY): Payer: Self-pay | Admitting: GASTROENTEROLOGY

## 2010-09-27 LAB — ADULT ROUTINE BLOOD CULTURE, SET OF 2 BOTTLES (BACTERIA AND YEAST)
CULTURE OBSERVATION: NO GROWTH
CULTURE OBSERVATION: NO GROWTH
REPORT STATUS: 10312011
REPORT STATUS: 10312011
SPECIAL REQUESTS: 1
SPECIAL REQUESTS: 1

## 2010-09-27 LAB — ANCA
C-ANCA: <1:20 {titer}
P-ANCA: <1:20 {titer}

## 2010-09-27 LAB — HISTORICAL SURGICAL PATHOLOGY SPECIMEN

## 2010-11-05 ENCOUNTER — Encounter (FREE_STANDING_LABORATORY_FACILITY)
Admit: 2010-11-05 | Discharge: 2010-11-05 | Disposition: A | Discharge status: Home / Self Care | Attending: Family Medicine | Admitting: Family Medicine

## 2010-11-08 LAB — HEP-2 SUBSTRATE ANTINUCLEAR ANTIBODIES (ANA), SERUM: ANTI-NUCLEAR AB: NEGATIVE

## 2010-12-01 ENCOUNTER — Telehealth (INDEPENDENT_AMBULATORY_CARE_PROVIDER_SITE_OTHER): Payer: Self-pay | Admitting: Medical

## 2010-12-01 MED ORDER — METOPROLOL TARTRATE 25 MG TABLET
25.0000 mg | ORAL_TABLET | Freq: Two times a day (BID) | ORAL | Status: DC
Start: 2010-12-01 — End: 2011-04-19

## 2010-12-01 MED ORDER — SIMVASTATIN 20 MG TABLET
20.0000 mg | ORAL_TABLET | Freq: Every evening | ORAL | Status: DC
Start: 2010-12-01 — End: 2011-04-19

## 2010-12-01 NOTE — Telephone Encounter (Signed)
Phoned Spencer [name] for results of stress test, and her husband, Spencer [name] answered the phone.  He claims that he missed his last appointment with Dr. Myer Peer, and forgot to re-schedule.  Would like to do that.  He is also out of refills on his lopressor and Zocor.  Informed patient I would forward this to [facility] and she will schedule him a follow up appointment and send a letter with date and time, and also E-Rx'ed his refills to [facility].  Patient verbalized understanding.  All questions/concerns were addressed.

## 2010-12-23 ENCOUNTER — Ambulatory Visit: Attending: Urology | Admitting: Urology

## 2010-12-23 DIAGNOSIS — R3129 Other microscopic hematuria: Secondary | ICD-10-CM | POA: Insufficient documentation

## 2010-12-23 DIAGNOSIS — R319 Hematuria, unspecified: Secondary | ICD-10-CM

## 2010-12-23 DIAGNOSIS — Z01818 Encounter for other preprocedural examination: Secondary | ICD-10-CM | POA: Insufficient documentation

## 2010-12-23 DIAGNOSIS — N400 Enlarged prostate without lower urinary tract symptoms: Secondary | ICD-10-CM | POA: Insufficient documentation

## 2010-12-23 NOTE — H&P (Signed)
Chief Complaint   Patient presents with   . Hematuria       HPI:   45 yo AA gentleman presents C/O microscopic blood in his urine. He was told this ~ 2 weeks ago at the [facility] hospital in [place].  Denied gross hematuria.  He also C/O a slow urinary stream x 1 1/2 years.  Nocturia x 2-3.       Patient request that his cystoscopy be performed under anesthesia.    Patient stated that he had a PSA drawn recently at the [facility] hospital and it was normal.        PMHx:  Patient Active Problem List   Diagnoses Code   . Chest Pain 786.21F   . Borderline Diabetes Mellitus 790.29AV   . Hyperlipidemia 272.4S   . Spells 780.39GQ   . Stress test 794.39S   . S/P cardiac catheterization V45.89AAW   . PAD (peripheral artery disease) 443.9BY   . Hypertriglyceridemia 272.1AL   . Anginal pain 413.9P   . CAD (coronary artery disease) 414.00AE   . Crohn's disease 555.9C   . IBS (irritable bowel syndrome) 564.1W   . GERD (gastroesophageal reflux disease) 530.81S         Past Surgical History   Procedure Date   . Pb colonoscopy,biopsy    . Hx laparotomy    . Hx heart catheterization    . Colonoscopy 09/24/2010     COLONOSCOPY performed by [name] at [facility] OR ENDO       NKDA      Outpatient prescriptions prior to encounter:  metoprolol (LOPRESSOR) 25 mg Oral Tablet take 1 Tab by mouth Twice daily.   simvastatin (ZOCOR) 20 mg Oral Tablet take 1 Tab by mouth QPM.   esomeprazole magnesium (NEXIUM) 40 mg Oral Capsule, Delayed Release(E.C.) take 1 Cap by mouth QAM.   Gabapentin (NEURONTIN) 400 mg Oral Capsule take 1 Cap by mouth every night.   aspirin 325 mg Tab take 325 mg by mouth Once a day.   nitroglycerin (NITROSTAT) 0.4 mg Subl by Sublingual route. for 3 doses over 15 minutes         History   Social History   . Marital Status: Married     Spouse Name: Alvy Beal     Number of Children: 4   . Years of Education: N/A   Occupational History   .  [employer]   Social History Main Topics   . Smoking status: Never Smoker     . Smokeless tobacco: Not on file   . Alcohol Use: No   . Drug Use: No   . Sexually Active: Not on file   Other Topics Concern   . Not on file   Social History Narrative   . No narrative on file         Family History   Problem Relation Age of Onset   . Hypertension Mother    . Hypertension Brother    . Diabetes Brother    . Hypertension Brother        SHx:   Rehab therapy assistant, non-smoker, no ETOH      ROS:                        General/Constitutional-Neg.      Skin/Breast-Neg.       Eyes/Ears/Nose/Mouth/Throat-Neg.      Cardiovascular-Neg.      Respiratory -Neg.  Gastrointestinal-Neg.                        Genitourinary -See HPI             Hematologic -Neg.       Endocrine -Neg.                         Musculoskeletal -Neg.                         Neurologic-Neg.                         Psychiatric -Neg.                         Allergic/Immunologic -Neg.      OBJECTIVE:               Vital Signs-   BP             P              RR            T    BP 110/82   Temp(Src) 36.8 C (98.3 F) (Tympanic)   Wt 99.791 kg (220 lb)                        General- WDWN gentleman  in NAD                         HEENT- Grossly intact                         Chest- Lungs CTA bilaterally                         Heart- RRR without murmurs or gallops noted                         Abdomen- Soft & NT                             Back- No CVAT                         GU- Normal phallus, testes descended without masses, no inguinal hernias noted                         Rectal- Prostate & rectum not optimally examined due to body habitus, apex of prostate enlarged & rubbery                         Extremities: Grossly intact, FROM x 4                         Neurologic- Grossly intact, alert & oriented x 4      LABS:                U/A-  Urine Dip Results:   Time collected: 1542  Glucose: Negative  Bilirubin: Negative  Ketones: Negative  Specific Gravity: 1.010  Blood: Small (1+)  pH: 6.5  Protein: Negative   Urobilinogen: Normal   Nitrite: Negative  Leukocytes: Negative          Results for MIRZA, FESSEL (MRN [id number]) as of 12/23/2010 15:30   Ref. Range 09/25/2010 07:00   SODIUM Latest Range: 136-145 mmol/L 135 (L)   POTASSIUM Latest Range: 3.5-5.1 mmol/L 3.7   CHLORIDE Latest Range: 96-111 mmol/L 101   CARBON DIOXIDE Latest Range: 23-33 mmol/L 27   BUN Latest Range: 8-26 mg/dL 7 (L)   CREATININE Latest Range: 0.62-1.27 mg/dL 4.09   GLUCOSE,NONFAST Latest Range: 65-139 mg/dL 811   ANION GAP Latest Range: 5-16 mmol/L 7   BUN/CREAT RATIO Latest Range: 6-22  7   ESTIMATED GLOMERULAR FILTRATION RATE Latest Range: >59 ml/min/1.57m2 >59   CALCIUM Latest Range: 8.5-10.4 mg/dL 8.8         Results for YOSEPH, HAILE (MRN [id number]) as of 12/23/2010 15:30   Ref. Range 09/25/2010 07:00   WBC Latest Range: 3.5-11.0 THOU/uL 6.5   HGB Latest Range: 13.1-17.3 g/dL 21.3   HCT Latest Range: 39.8-50.2 % 38.9 (L)   PLATELET COUNT Latest Range: 140-450 THOU/uL 206   RBC Latest Range: 4.46-5.70 MIL/uL 4.58   MCV Latest Range: 82.0-99.0 fL 85.0   MCHC Latest Range: 31.6-35.5 g/dL 08.6 (H)   MCH Latest Range: 27.4-33.0 pg 30.4   RDW Latest Range: 10.2-14.0 % 11.6   MPV Latest Range: 7.4-10.4 FL 7.6           DIAGNOSTIC STUDIES:    Final      CT ABDOMEN PELVIS W IV CONTRAST             Study Result Delynn Ligas   EXAMINATION: CT scan abdomen and pelvis. History is abdominal pain, left lower quadrant with a history of Crohn disease.   FINDINGS: Multiplanar IV and oral contrast-enhanced CT imaging is reviewed. The right lung base shows some atelectasis.   Liver, spleen, pancreas, gallbladder and kidneys all appear unremarkable.   There is no evidence for bowel obstruction. No abdominal inflammatory changes are seen. There is no specific abnormality of large or small bowel seen. There is no evidence for diverticulitis.   IMPRESSION:   Unremarkable exam, no specific abnormality is seen.            Results Dictation on 09/21/2010 11:43 AM by Sudie Grumbling, MD          This study was interpreted by:  Sudie Grumbling, MD     This report has been reviewed and released by:  Signed by: Sudie Grumbling, MD on Tue Sep 21, 2010 04:29:00 PM EST           ASSESSMENT:   1) Microhematuria-etiology undetermined    2) BPH    PLAN:   1) Schedule bladder UD study & cystoscopy (under anesthesia) on 2 [place].   2) Reevaluate after #1.    Erven Colla., MD, FACS  Assistant Professor   Division of Urology  [facility]

## 2010-12-25 DIAGNOSIS — R3129 Other microscopic hematuria: Secondary | ICD-10-CM | POA: Insufficient documentation

## 2010-12-30 MED ORDER — CEFAZOLIN 10 GRAM SOLUTION FOR INJECTION
2.00 g | Freq: Once | INTRAMUSCULAR | Status: DC
Start: 2010-12-31 — End: 2010-12-31
  Filled 2010-12-30: qty 20

## 2010-12-31 ENCOUNTER — Other Ambulatory Visit (HOSPITAL_COMMUNITY): Payer: Self-pay | Admitting: Urology

## 2010-12-31 ENCOUNTER — Ambulatory Visit
Admission: RE | Admit: 2010-12-31 | Discharge: 2010-12-31 | Disposition: A | Discharge status: Home / Self Care | Source: Ambulatory Visit | Attending: Urology | Admitting: Urology

## 2010-12-31 ENCOUNTER — Encounter (HOSPITAL_COMMUNITY): Payer: Self-pay

## 2010-12-31 ENCOUNTER — Ambulatory Visit (HOSPITAL_COMMUNITY)
Admission: RE | Admit: 2010-12-31 | Discharge: 2010-12-31 | Disposition: A | Discharge status: Home / Self Care | Source: Ambulatory Visit

## 2010-12-31 ENCOUNTER — Encounter (HOSPITAL_COMMUNITY)
Admission: RE | Disposition: A | Discharge status: Home / Self Care | Payer: Self-pay | Source: Ambulatory Visit | Attending: Urology

## 2010-12-31 ENCOUNTER — Ambulatory Visit (HOSPITAL_COMMUNITY): Admitting: Anesthesiology

## 2010-12-31 ENCOUNTER — Encounter (HOSPITAL_COMMUNITY): Payer: Self-pay | Admitting: Family

## 2010-12-31 ENCOUNTER — Ambulatory Visit (HOSPITAL_BASED_OUTPATIENT_CLINIC_OR_DEPARTMENT_OTHER): Admitting: Urology

## 2010-12-31 DIAGNOSIS — R3129 Other microscopic hematuria: Secondary | ICD-10-CM

## 2010-12-31 DIAGNOSIS — N400 Enlarged prostate without lower urinary tract symptoms: Secondary | ICD-10-CM

## 2010-12-31 HISTORY — DX: Post-traumatic stress disorder, unspecified: F43.10

## 2010-12-31 LAB — BASIC METABOLIC PANEL
ANION GAP: 9 mmol/L (ref 5–16)
BUN/CREAT RATIO: 9 (ref 6–22)
BUN: 8 mg/dL (ref 8–26)
CALCIUM: 9.4 mg/dL (ref 8.5–10.4)
CARBON DIOXIDE: 27 mmol/L (ref 23–33)
CHLORIDE: 103 mmol/L (ref 96–111)
CREATININE: 0.92 mg/dL (ref 0.62–1.27)
ESTIMATED GLOMERULAR FILTRATION RATE: >59 ml/min/1.73m2 (ref >59)
GLUCOSE,NONFAST: 105 mg/dL (ref 65–139)
POTASSIUM: 4.1 mmol/L (ref 3.5–5.1)
SODIUM: 139 mmol/L (ref 136–145)

## 2010-12-31 LAB — PERFORM POC WHOLE BLOOD GLUCOSE
GLUCOSE, POINT OF CARE: 116 mg/dL — ABNORMAL HIGH (ref 70–105)
GLUCOSE, POINT OF CARE: 115 mg/dL — ABNORMAL HIGH (ref 70–105)
GLUCOSE, POINT OF CARE: 118 mg/dL — ABNORMAL HIGH (ref 70–105)
GLUCOSE, POINT OF CARE: 101 mg/dL (ref 70–105)

## 2010-12-31 SURGERY — CYSTOSCOPY
Anesthesia: General | Site: Bladder | Wound class: Clean Contaminated Wounds-The respiratory, GI, Genital, or urinary

## 2010-12-31 SURGERY — CYSTOMETROGRAM WITH URODYNAMICS
Anesthesia: Topical

## 2010-12-31 MED ORDER — ONDANSETRON HCL (PF) 4 MG/2 ML INJECTION SOLUTION
4.0000 mg | Freq: Once | INTRAMUSCULAR | Status: DC | PRN
Start: 2010-12-31 — End: 2010-12-31

## 2010-12-31 MED ORDER — FENTANYL (PF) 50 MCG/ML INJECTION SOLUTION
INTRAMUSCULAR | Status: AC
Start: 2010-12-31 — End: 2010-12-31
  Filled 2010-12-31: qty 2

## 2010-12-31 MED ORDER — ONDANSETRON HCL (PF) 4 MG/2 ML INJECTION SOLUTION
INTRAMUSCULAR | Status: AC
Start: 2010-12-31 — End: 2010-12-31
  Filled 2010-12-31: qty 2

## 2010-12-31 MED ORDER — SODIUM CHLORIDE 0.9 % (FLUSH) INJECTION SYRINGE
2.0000 mL | INJECTION | Freq: Three times a day (TID) | INTRAMUSCULAR | Status: DC
Start: 2010-12-31 — End: 2010-12-31

## 2010-12-31 MED ORDER — HYDROMORPHONE (PF) 1 MG/ML INJECTION SOLUTION
INTRAMUSCULAR | Status: AC
Start: 2010-12-31 — End: 2010-12-31
  Filled 2010-12-31: qty 1

## 2010-12-31 MED ORDER — SODIUM CHLORIDE 0.9 % (FLUSH) INJECTION SYRINGE
2.0000 mL | INJECTION | INTRAMUSCULAR | Status: DC | PRN
Start: 2010-12-31 — End: 2010-12-31

## 2010-12-31 MED ORDER — NALOXONE 0.4 MG/ML INJECTION SOLUTION
INTRAMUSCULAR | Status: AC
Start: 2010-12-31 — End: 2010-12-31
  Filled 2010-12-31: qty 1

## 2010-12-31 MED ORDER — PROCHLORPERAZINE EDISYLATE 10 MG/2 ML (5 MG/ML) INJECTION SOLUTION
5.0000 mg | Freq: Once | INTRAMUSCULAR | Status: DC | PRN
Start: 2010-12-31 — End: 2010-12-31

## 2010-12-31 MED ORDER — MEPERIDINE (PF) 25 MG/ML INJECTION SYRINGE
12.5000 mg | INJECTION | INTRAMUSCULAR | Status: DC | PRN
Start: 2010-12-31 — End: 2010-12-31

## 2010-12-31 MED ORDER — HYDROMORPHONE (PF) 1 MG/ML INJECTION SOLUTION
INTRAMUSCULAR | Status: AC
Start: 2010-12-31 — End: 2010-12-31
  Filled 2010-12-31: qty 2

## 2010-12-31 MED ORDER — HYDROMORPHONE (PF) 1 MG/ML INJECTION SOLUTION
0.2000 mg | INTRAMUSCULAR | Status: DC | PRN
Start: 2010-12-31 — End: 2010-12-31
  Administered 2010-12-31 (×2): 0.4 mg via INTRAVENOUS
  Administered 2010-12-31 (×2): 0.6 mg via INTRAVENOUS

## 2010-12-31 MED ORDER — MIDAZOLAM 1 MG/ML INJECTION SOLUTION
INTRAMUSCULAR | Status: AC
Start: 2010-12-31 — End: 2010-12-31
  Filled 2010-12-31: qty 2

## 2010-12-31 MED ORDER — CIPROFLOXACIN 500 MG TABLET
500.00 mg | ORAL_TABLET | Freq: Two times a day (BID) | ORAL | Status: DC
Start: 2010-12-31 — End: 2011-04-19

## 2010-12-31 MED ORDER — LACTATED RINGERS INTRAVENOUS SOLUTION
INTRAVENOUS | Status: DC
Start: 2010-12-31 — End: 2010-12-31

## 2010-12-31 SURGICAL SUPPLY — 14 items
BAG DRAIN NONST LF  VNYL ACT 8MM DISP GE/OEC SYS (UROLOGICAL SUPPLIES) ×1 IMPLANT
CATH URETH LUBRICATH 16FR FOLEY 2W BAL LUB SIL DDRGL 5CC STRL LTX DISP (UROLOGICAL SUPPLIES) ×1 IMPLANT
CONV USE 339329 - PACK SURG T SIRUS CSCP III REINF TBL CVR DRP IMPRV STRL 76X44IN 39X18IN POLY LF (DRAPE/PACKS/SHEETS/OR TOWEL) ×2
CONV USE ITEM 338593 - PACK SURG CYSTO PREP STRL DISP LTX (TRAY) ×2 IMPLANT
DISC USE ITEM 163322 - SYRINGE LL 20ML LTX STRL MED (NEEDLES & SYRINGE SUPPLIES) ×1 IMPLANT
DISCONTINUED NO SUB - JELLY LUB DYNALUBE BCTRST WATER SOL NGRS PKT STRL 5GM LF (WOUND CARE SUPPLY) ×4 IMPLANT
GW URO .035IN 150CM 3CM ZIPWR STR RADOPQ STD SHAFT STRBL KINK RST NITINOL POLYUR HDRPH URET (UROLOGICAL SUPPLIES) IMPLANT
PACK SURG T SIRUS CSCP III REINF TBL CVR DRP IMPRV STRL 76X44IN 39X18IN POLY LF (DRAPE/PACKS/SHEETS/OR TOWEL) ×2 IMPLANT
SET 81IN REG CLAMP N-PYRG IRRG 10 GTT/ML STR CSCP DEHP BLADDER STRL LF (IV TUBING & ACCESSORIES) ×1 IMPLANT
SET 82IN REG CLAMP N-PYRG IRRG 10 GTT/ML Y TUR DEHP BLADDER STRL LF (IV TUBING & ACCESSORIES) ×1 IMPLANT
SOL IRRG 0.9% NACL 1000ML PLASTIC PR BTL ISTNC N-PYRG STRL LF (SOLUTIONS) ×1 IMPLANT
SPONGE SURG 4X4IN 16 PLY RADOPQ BAND VISTEC STRL LF  BLU WHT (WOUND CARE SUPPLY) ×1 IMPLANT
SYRINGE HYPO 10CC LL 309604 100/BX (Syringes w/ o Needles) ×1 IMPLANT
WATER STRL 2000ML PLASTIC CONTAINR UROMATIC LF (SOLUTIONS) ×1 IMPLANT

## 2010-12-31 SURGICAL SUPPLY — 16 items
CATH AIR CAT895 BX/10 (UROLOGICAL SUPPLIES) ×1 IMPLANT
CATH SNGL SENSOR 7FR CAT875 BX/10 (UROLOGICAL SUPPLIES) ×1 IMPLANT
CATH URETH DOVER RBNL 12FR 16IN 2 STGR DRAIN EYE RND CLS TIP INT FNL CONN PVC STRL LF  DISP (UROLOGICAL SUPPLIES) IMPLANT
CATH URODY TDOC AIR-CHRG 7FR COUDE SENSOR INFUS PORT BAL PRESS BLADDER DISP (VASCULAR) IMPLANT
COLLECTOR MSRT SLANT GRAD C32OZ NONST WHT SPECI (PATU) IMPLANT
CONTAINR POLYPROP 32OZ PRNT GRAD FROST PNL UNBRK SPECI 3ANG (Cautery Accessories) ×1 IMPLANT
DEVICE SECURE FOLEY CATH ANCH LONG WR TIME ADH PVC FOAM LTX (UROLOGICAL SUPPLIES) IMPLANT
DISCONTINUED USE 338708 - BASIN MED GRAD 32OZ BLU STRL (MISCELLANEOUS PT CARE ITEMS)
DISCONTINUED USE 340506 - BASIN MED GRAD 32OZ BLU STRL (MISCELLANEOUS PT CARE ITEMS) IMPLANT
ELECTRODE ADH CNDCT SNAP REWET TABLE EKG ASST NEOTRODE (Electrical Supplies) ×1 IMPLANT
JELLY LUBE STRL 4OZ TUBE 4OZLUB 12EA/BX (MISCELLANEOUS PT CARE ITEMS) ×1 IMPLANT
LINE PRESSURE MONITOR TUB576 BX/50 (MONITORING SUPPLIES) IMPLANT
SOL IRRG 0.9% NACL 1000ML PLASTIC PR BTL ISTNC N-PYRG STRL LF (SOLUTIONS) IMPLANT
SPONGE GAUZE STRL 4 X 4IN TUB_6939 1280/CS (WOUND CARE SUPPLY) ×2 IMPLANT
SYRINGE HYPO 10CC LL 309604 100/BX (Syringes w/ o Needles) ×1 IMPLANT
TUBING URODY 400CM SIL PVC PUMP INFUS LINE STRL LF (Drains/Resovoirs) IMPLANT

## 2010-12-31 NOTE — OR Nursing (Signed)
Cut time same as beginning of procedure and start time is same also

## 2010-12-31 NOTE — Progress Notes (Addendum)
[facility]  Discharge Day Note    Spencer [name]  Date of service: 12/31/2010  Date of Admission:  12/31/2010  Hospital Day:  LOS: 0 days       Examination at discharge:  Vital Signs:  Temperature: 36 C (96.8 F) (12/31/10 0947)  Heart Rate: 65  (12/31/10 0947)  BP (Non-Invasive): 130/62 mmHg (12/31/10 0947)  Respiratory Rate: 18  (12/31/10 0947)  SpO2-1: 98 % (12/31/10 0947)  General: no distress    Assessment/ Plan:   There are no hospital problems to display for this patient.  s/p cysto and UDS    Disposition : Home discharge      [place] P Hubsher, MD 12/31/2010, 11:21 AM      Avie Arenas, MD 12/31/2010, 12:36 PM

## 2010-12-31 NOTE — OR PostOp (Signed)
Patient meets discharge criteria, report called to area d, patient instructed on deep breathing and coughing exercises.

## 2010-12-31 NOTE — Discharge Instructions (Signed)
SURGICAL DISCHARGE INSTRUCTIONS     Dr. Avie Arenas, MD  performed your CYSTOSCOPY today at the [facility]    [facility]:  Monday through Friday from 6 a.m. - 7 p.m.: [phone]  Between 7 p.m. - 6 a.m., weekends and holidays:  Call [facility] at [phone] or [phone].    PLEASE SEE WRITTEN HANDOUTS AS DISCUSSED BY YOUR NURSE:  White RN    SIGNS AND SYMPTOMS OF A WOUND / INCISION INFECTION   Be sure to watch for the following:   Increase in redness or red streaks near or around the wound or incision.   Increase in pain that is intense or severe and cannot be relieved by the pain medication that your doctor has given you.   Increase in swelling that cannot be relieved by elevation of a body part, or by applying ice, if permitted.   Increase in drainage, or if yellow / green in color and smells bad. This could be on a dressing or a cast.   Increase in fever for longer than 24 hours, or an increase that is higher than 101 degrees Fahrenheit (normal body temperature is 98 degrees Fahrenheit). The incision may feel warm to the touch.    **CALL YOUR DOCTOR IF ONE OR MORE OF THESE SIGNS / SYMPTOMS SHOULD OCCUR.    ANESTHESIA INFORMATION   ANESTHESIA -- ADULT PATIENTS:  You have received intravenous sedation / general anesthesia, and you may feel drowsy and light-headed for several hours. You may even experience some forgetfulness of the procedure. DO NOT DRIVE A MOTOR VEHICLE or perform any activity requiring complete alertness or coordination until you feel fully awake in about 24-48 hours. Do not drink alcoholic beverages for at least 24 hours. Do not stay alone, you must have a responsible adult available to be with you. You may also experience a dry mouth or nausea for 24 hours. This is a normal side effect and will disappear as the effects of the medication wear off.    REMEMBER   If you experience any difficulty breathing, chest pain, bleeding that you feel is excessive,  persistent nausea or vomiting or for any other concerns:  Call your physician Dr. Venia Minks at [phone] or [phone]. You may also ask to have the Urology doctor on call paged. They are available to you 24 hours a day.    SPECIAL INSTRUCTIONS / COMMENTS     FOLLOW-UP APPOINTMENTS   Please call patient services at [phone] or [phone] to schedule a date / time of return. They are open Monday - Friday from 7:30 am - 5:00 pm.

## 2010-12-31 NOTE — Anesthesia Preprocedure Evaluation (Addendum)
Additional History Findings:  Patient / Family anesthesia-related difficulties: No   LMP: N/A   Other: No       Airway   Mallampati: II  TM distance: >3 FB  Neck ROM: full  Mouth Opening: fair.  Dental     (+) dentition intact                       Physical Exam:  Lungs: negative  Heart: negative  Other: No    EKG Ordered:  12/31/2010   CXR: Not ordered  Other Studies: cardiac cath 2009 - ess.t normal vessels- merlin  Consults: None  Follow-Up: None      Anesthesia type: general and MAC    ASA 2        Patient's NPO status is appropriate for Anesthesia.      Anesthetic plan and risks discussed with patient.            Plan discussed with CRNA.              Pt states he did not take any meds today. Last aspirin 12-30-10.

## 2010-12-31 NOTE — OR PostOp (Signed)
Patient received in area b post surgery anesthesia at bedside history and allergies reviewed, patient remains sedate, see doc flowsheet for further information.

## 2010-12-31 NOTE — Nurses Notes (Signed)
Cut time on this OR log will reflect the scope insertion

## 2010-12-31 NOTE — H&P (Signed)
[facility]                                                     H&P Update Form    Spencer [name],Spencer [name], 45 y.o. male  Date of Admission:  12/31/2010  Date of Birth:  01/17/1966    12/31/2010    STOP: IF H&P IS GREATER THAN 30 DAYS FROM SURGICAL DAY COMPLETE NEW H&P IS REQUIRED.    Outpatient Pre-Surgical H & P updated the day of the procedure.  1.  H&P completed within 30 days of surgical procedure was performed by Dr. Venia Minks on 12-23-10 and has been reviewed, the patient has been examined, and no change has occured in the patients condition since the H&P was completed.       Change in medications: No      Last Menstrual Period: Not applicable      Comments:     2.  Patient continues to be appropiate candidate for planned surgical procedure. YES      Erven Colla., MD, FACS  Assistant Professor   Division of Urology  [facility]

## 2010-12-31 NOTE — Nurses Notes (Signed)
D/c' to home with family. Stable. Taken to lobby in w/c by T Grogg

## 2010-12-31 NOTE — Anesthesia Postprocedure Evaluation (Signed)
ANESTHESIA POSTOP EVALUATION NOTE        Anesthesia Service      [facility]     12/31/2010      Last Vitals: Temperature: 36 C (96.8 F) (12/31/10 0947)  Heart Rate: 65  (12/31/10 0947)  BP (Non-Invasive): 130/62 mmHg (12/31/10 0947)  Respiratory Rate: 18  (12/31/10 0947)  SpO2-1: 98 % (12/31/10 0947)  Weight: 97.2 kg (214 lb 4.6 oz) (12/31/10 0947)  Procedure(s) Performed:  CYSTOSCOPY  Patient is sufficiently recovered from the effects of anesthesia to participate in the evaluation and has returned to their pre-procedure level.  I have reviewed and evaluated the following:  Respiratory Function: Consistent with pre anesthetic level  Cardiovascular Function: Consistent with pre anesthetic level  Mental Status: Return to pre anesthetic baseline level    Comment/ re-evaluation for any variations: None    Faculty Note:  I examined the patient and agree with the above details of postoperative assessment.

## 2010-12-31 NOTE — OR PostOp (Signed)
Patient states pain is better drinking gingerale without problems, sats on room air 94-96%, report to be called to area d.

## 2010-12-31 NOTE — OR Surgeon (Addendum)
[facility]   DEPARTMENT OF UROLOGY   OPERATION SUMMARY     PATIENT NAME: Spencer [name] [facility] NUMBER: [id number]   DATE OF SERVICE: 12/31/2010   DATE OF BIRTH: 1966/08/09     PREOPERATIVE DIAGNOSIS:    Microhematuria, BPH     POSTOPERATIVE DIAGNOSIS:   Same     NAME OF PROCEDURE:    1. Complex Urodynamics  2. Rigid cystourethroscopy with bladder barbatoge    FINDINGS: 1. Bladder capacity of 437, 2. Able to spontaneously void, 3. Bladder PVR of 0cc, 4. No masses lesions stones noted in bladder, 5. Bilateral UOs in normal anatomic position.  ANESTHESIA: 2% lidocaine gel.     SURGEON(S): Dr. Venia Minks   RESIDENT(S): [place] P Hubsher, MD    ESTIMATED BLOOD LOSS: None.     COMPLICATIONS: None.  INDICATIONS FOR PROCEDURE: Spencer [name] is a 45 y.o. male who presented to the urology clinic for evaluation of microhematuria and BPH. Previous CT scan did not demonstrate abnormalities. Patient presents for bladder urodynamics followed by cystoscopy under anesthesia.    DESCRIPTION OF PROCEDURE: The patient was consented preoperatively and all risks and benefits of the procedure were explained including bleeding, infection, pain, bladder and urethral injury. He was then brought back to the cystoscopy suite and positioned for the urodynamic study. A 6-French urodynamics catheter was placed through the urethra and secured to the thigh with a piece of tape. Patch EMG electrodes were placed on either side of the perineum and a small rectal tube was placed to monitor abdominal pressure. Medium fill cystometry was undertaken. The patient experienced his first desire at 61cc, normal desire at 231cc, urgency at 388cc, and a maximum capacity of 437cc. During this filling phase, he was asked to cough, sneeze and strain and had no loss of urine. Upon voiding, the patient was able to void 460cc at a peak velocity of 12.2cc/sec and a pressure of 31cm/H2O. Their PVR was 0cc.   Following urodynamics, MAC anesthesia was induced and he was laid in the lithotomy position and his genitalia were prepped and draped in the standard sterile fashion. 10cc of 2% Lidocaine Uro-Jet were infused into the urethra. We passed a 30 degree lens in a 17-French rigid cystoscope through the male urethra and into the bladder under direct visualization. Upon entering the bladder, panendoscopic views were obtained. The 30 degree lens was switched for a 70 degree lens. Both ureteral orifices were observed to be of normal appearance and in their expected anatomic position. There was good efflux from both UOs. Upon viewing the bladder, there were no masses, lesions or stones noted. The scope was then pulled back to examine the prostate. The prostate appeared mildly enlarged. A bladder barbatoge was performed. The scope was then withdrawn, visualizing the entire urethra. There were no urethral abnormalities noted. The patient was awakened from anesthesia. The patient tolerated this procedure well and was then taken back to the post-operative area in stable condition.  Dr. Venia Minks was present and participated in the entire procedure  DISPOSITION: The patient will be discharged home with a 3-day course of cipro. He will follow up in 6 months. Patient is likely having microhematuria atribuatble to his BPH. No obvious abnormalities were noted. His urodynamics were within normal limits.    [place] P Hubsher, MD 12/31/2010, 11:23 AM    [place] P. Hubsher, M.D.  Resident, [facility]  Division of Urology    Avie Arenas, MD 12/31/2010, 12:34 PM  Erven Colla., MD, FACS  Assistant Professor   Division of Urology  [facility]

## 2011-01-03 LAB — HISTORICAL URINARY CYTOLOGY

## 2011-01-04 ENCOUNTER — Encounter (INDEPENDENT_AMBULATORY_CARE_PROVIDER_SITE_OTHER): Payer: Self-pay | Admitting: Internal Medicine

## 2011-01-06 ENCOUNTER — Encounter (INDEPENDENT_AMBULATORY_CARE_PROVIDER_SITE_OTHER): Admitting: Gastroenterology

## 2011-02-24 ENCOUNTER — Ambulatory Visit (INDEPENDENT_AMBULATORY_CARE_PROVIDER_SITE_OTHER): Admitting: Urology

## 2011-04-01 LAB — ALT (SGPT): ALT (SGPT): 15

## 2011-04-01 LAB — CBC/DIFF
BASOS ABS: 0
EOS ABS: 0.1
HCT: 41.3 — ABNORMAL LOW
HGB: 14.4
LYMPHS ABS: 2.8
MCH: 30.3
MCHC: 34.9
MCV: 86.6
MONOS ABS: 0.4
MPV: 7.8
PLATELET COUNT: 191
RBC: 4.8
RDW: 13.6
WBC: 5.8

## 2011-04-01 LAB — BASIC METABOLIC PANEL
ANION GAP: 7.7
BUN/CREAT RATIO: 8 — ABNORMAL LOW
BUN: 8
CALCIUM: 9.3
CARBON DIOXIDE: 29.1
CHLORIDE: 103.3
CREATININE: 1
ESTIMATED GLOMERULAR FILTRATION RATE: 103.9
GLUCOSE,NONFAST: 110 — ABNORMAL HIGH
POTASSIUM: 4.1
SODIUM: 136

## 2011-04-01 LAB — HEPATIC FUNCTION PANEL
ALBUMIN: 4.1
ALKALINE PHOSPHATASE: 47
ALT (SGPT): 15
AST (SGOT): 20
BILIRUBIN, TOTAL: 2 — ABNORMAL HIGH
BILIRUBIN,CONJUGATED: 0.1
TOTAL PROTEIN: 7.4

## 2011-04-01 LAB — LIPID PANEL
CHOLESTEROL: 243 — ABNORMAL HIGH
HDL-CHOLESTEROL: 43.7
LDL (CALCULATED): 164.1 — ABNORMAL HIGH
NON - HDL (CALCULATED): 199.3 — ABNORMAL HIGH
TRIGLYCERIDES: 176 — ABNORMAL HIGH

## 2011-04-01 LAB — HGA1C (HEMOGLOBIN A1C WITH EST AVG GLUCOSE): HEMOGLOBIN A1C: 5.9

## 2011-04-19 ENCOUNTER — Ambulatory Visit (INDEPENDENT_AMBULATORY_CARE_PROVIDER_SITE_OTHER): Admitting: Internal Medicine

## 2011-04-19 ENCOUNTER — Encounter (INDEPENDENT_AMBULATORY_CARE_PROVIDER_SITE_OTHER): Payer: Self-pay | Admitting: Internal Medicine

## 2011-04-19 VITALS — BP 112/90 | HR 73 | Resp 20 | Ht 71.0 in | Wt 212.0 lb

## 2011-04-19 MED ORDER — SIMVASTATIN 20 MG TABLET
20.0000 mg | ORAL_TABLET | Freq: Every evening | ORAL | Status: DC
Start: 2011-04-19 — End: 2011-05-10

## 2011-04-19 MED ORDER — METOPROLOL TARTRATE 25 MG TABLET
25.0000 mg | ORAL_TABLET | Freq: Two times a day (BID) | ORAL | Status: DC
Start: 2011-04-19 — End: 2011-05-10

## 2011-04-19 NOTE — Progress Notes (Signed)
 See dictated note.

## 2011-04-23 NOTE — Progress Notes (Signed)
[facility]  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  This is his second visit with [place].  We saw him back in July 2011.  The patient missed his followup and did not reschedule until now.  He is doing quite well from a cardiac standpoint.  He is tolerating simvastatin and metoprolol.  He has rare chest pain, but does not use nitroglycerin.  He had a flare of his Crohn's disease back in February and was admitted to [facility].  He also has had intermittent microscopic hematuria and follows with Dr. Venia Minks in urology and has an appointment with him in August.  The patient is going to try to send [place] his lipid labs which he just had done at the [place].  He still works at the [place].  He lives in [place].    The patient was doing a high impact aerobic exercise regimen prior to his Crohn's flare.  He had his weight down below 200 but then his weight went back up when he was on steroids.  He stopped his Nexium because he did not feel that it was helping his symptoms.  He denies GERD symptoms.    MEDICATIONS:  1.  Lopressor 25 mg twice a day.  2.  Simvastatin 20 mg each evening.  3.  Neurontin 400 mg every night.    4.  Aspirin 325 mg daily.  5.  Nitroglycerin as needed.    SOCIAL HISTORY:  The patient does not smoke or drink alcohol.  He works at the [employer].  He is a former Arts development officer.    OBJECTIVE:  BP 112/90, pulse 73, weight 212 pounds, which is stable and actually down 4 pounds when we saw him back in July 2011.  On exam, he is in no acute distress.  He appears his stated age.  Neck veins are flat.  Heart sounds are regular with normal S1, S2.  Lungs are clear.  Abdomen is soft and nontender.  Extremities without edema.     The patient's recent lipid panel from the [place] is pending.  Our last lipid panel prior to that was in 2010.    ASSESSMENT:  1.  Mild CAD with myocardial bridging.  2.  Atypical chest pain/stable angina.  3.  Hyperlipidemia and hypertriglyceridemia.  4.  Crohn's disease.  5.  Diet-controlled diabetes mellitus.  6.  History of PAD.  7.  Irritable bowel syndrome.  8.  Recurrent microscopic hematuria followed by urology.    PLAN:  1.  The patient will try to get [place] a copy of his lipid results.  For now, continue simvastatin with a goal LDL less than 100, ideally less than 70.  2.  Refill given for simvastatin and metoprolol.  3.  Continue aspirin.  If hematuria continues to be a problem it would be okay to lower his aspirin to 81 mg daily.  4.  Tentative followup in 1 year.       Trina Ao, MD  Assistant Professor, Section of Cardiology  [facility] Department of Medicine    [id number]; D: 04/19/2011 15:28:55; T: 04/20/2011 11:51:11    cc: [name] DO      [facility] [address]      [place], [place] [address]

## 2011-05-02 NOTE — Letter (Signed)
[facility] Department of Neurology  [address]  [place], [place] [address]      PATIENT NAME: Spencer [name], Spencer [name]  CHART NUMBER: [id number]  DATE OF BIRTH: 17-May-1966  DATE OF SERVICE: 05/19/2008    May 19, 2008      Leta Jungling, MD  [address]  [place], [place] [address]    Dear Susann Givens:     I am seeing Mr. Shaler for neurologic consultation.  Please refer to my complete written consult for details.  He comes with the following issues:     1.  He said that he had a spell of loss of consciousness in the middle of last year.  He says that he had not been feeling well.  He had actually diarrhea.  He was in the bathroom having a bowel movement and this is the last thing he remembers.  He woke up surrounded by the paramedics.  His wife says that the patient actually asked her to get some water because he was not feeling well and when she came up to see him, she found the patient face down on the floor, immobile.  She said it took him a little while for him to return to normal, perhaps half an hour.  They say that he was taken to the hospital and as far as he knows, no tests were done.  He was given some pain medications and then sent home (I do not have those records for my review).  He said that he has not had any more of those spells again.  However, his wife says that at times she also has observed that he has had a spell where he shook his whole body lasting few seconds.  She said that this event looked like an epileptic spell and then he woke up, and he fell back to sleep.  She did not take him to the doctor.  The patient says that he had three events where he woke up and noted that he bit his tongue.  He said that there was a laceration on the left side of his tongue.  This happened three times in the past year.  He said that he had an unexplained bruise on his left leg but it had improved.  He has not had more bruises.  Regarding his risk factors for seizures, he had no febrile seizures as a baby, as far as he knows, and he never had seizures in the past.  A maternal aunt had seizures.  No head trauma, no meningitis or evidence of encephalitis of the CNS.  She has never been on any antiepileptic drugs as far as he knows.      He comes to see whether those spells are seizures or not.       2.  He also complains of headaches.  He says that those headaches have been going on for the past three years.  Prior to that, he had headaches, but they were very infrequent and not as severe.  Now they are pretty severe and he believes they are getting worse.  He said the headaches are preceded by blurry vision, lightheadedness, a feeling that he is almost going to pass out, and then he does not really lose consciousness or no confusion, but then he developed this pain of his frontal area, temporal area.  It is throbbing and can last 6-8 hours.  He says that he feels nauseous.  He said he can take Excedrin and the  headache will go away.  He says when that happens, he needs to go to a quiet room or a dark room and he sleeps and then following that he will feel better.    Those most likely represent primary headaches, migraine headaches.    3.  Lower back pain.  He says that since 2005, he has developed this pain of his back and at times it has radiated down into his legs.  He does not feel weak.  He also has pain of his upper thoracic area.  He said that he has seen a chiropractor with some manipulation but without any substantial benefit.    Past Medical History:  He has not been diagnosed with any disease.  He said that in February of this year, he had severe chest pain.  He underwent a coronary artery angiogram.  He was told that there was some plaque built up but no substantial narrowing and nothing critical.  He said that he had no heart attack but his cholesterol was high.      Medications:  His medications are Nexium 40 mg a day, naproxen as needed, and Zocor daily.       Social History:  He lives with his wife.  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  He used to work at [employer] here at [employer] in the past.  He said that he was in active duty in the [employer] and in [place] from 2004 to 2005.  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  No tobacco smoking.  No alcohol abuse.  No drug abuse.    Family History:  His mother is 42 and has hypertension but otherwise is well.  He does not know his father.    Review of Systems:  He says his weight is stable.  At times, gets night sweats maybe weather related, but he is not sure.  His vision is okay with glasses to read.  ENT:  No problems.  He says that he has had two chest pains since the chest had in February but his cardiologist told that it was not a concern for any cardiac problem.  He said that he gets short of breath and in fact, he had a pulmonary function test recently by a pulmonologist.  He said that his functions were sort of on the low side and he is undergoing further workup.  GI:  He says that he has diarrhea frequently here and there.  He has been seeing recently a GI doctor who told him that he has some inflammation of his stomach, but no definite diagnosis, for which he was put on Nexium.  GU:  No problems.  MS:  Joint pains as above-mentioned.  Skin:  He said that he has the bruise on his left leg and sometimes he has it on the right for an unknown reason.  I told him to seek medical attention for that.  Psychiatry:  He has never thought that he was depressed.  He does not feel depressed, no suicidal ideation.:  Heme/Lymph:  He says that he has felt some lymph nodes/nodules on his axillary area bilaterally.  I told him to see you for that.      Allergies:  NKDA.    Physical Examination:  Blood pressure 110/78.  His weight is 216 pounds, height 5 feet 10 inches, pulse 72.  Neurological examination is entirely normal.     IMPRESSION/PLAN:  Mr.  Hrithik Boschee is a 45 year old male, who says that he has the following issues:    1.  Event of loss of consciousness, and reported that his wife saw some spells of body jerking and unexplained tongue biting and lightheadedness spells.  My understanding is that you wanted to rule out whether the patient is having seizures.  The plan is for him to be admitted to the Epilepsy Monitoring Unit.  We will also do an MRI of the brain to make sure there are no abnormalities and a CT angiogram to make sure there is no narrowing in view that reportedly he had atherosclerosis of his coronary arteries.      2.  He complains of headaches.  I believe those are migraine headaches.  They happen once a month and Excedrin takes care of it, so I think for now, he can continue taking this medication.  If they become more frequent, he may require a prophylactic agent.      3.  He complains of lower back pain with some radiation now into his legs.  His exam is normal.  Based on the exam, I do not suspect that he has a spinal problem, but if he does not improve, he may require further workup that may include imaging of his spine or electrophysiological testing (EMG/NCS).      4.  He complains also of upper thoracic area pain.  His exam is normal.  I do not suspect any neurological problems, but if persists or does not get better, other workup may be required, i.e. MRI of the spine.  For now, he should continue conservative measures.    5.  Furthermore, the patient is complaining of muscle pain.  He has been on Zocor and, as you know, this medication could cause muscle pain.  One possibility is to get him off this medication to see how he does and to use an alternative antilipid agent.    I will see him after the above tests and feel free to call me if you have any questions.  I explained this all to the patient and his wife.  They had questions, which I believe were answered to their satisfaction.    Sincerely,       Lurlean Nanny, MD  Assistant Professor, Section of Neurology  [facility] Department of Neurology    [id number]; D: 05/19/2008 12:39:59; T: 05/19/2008 15:24:53

## 2011-05-10 ENCOUNTER — Telehealth (INDEPENDENT_AMBULATORY_CARE_PROVIDER_SITE_OTHER): Payer: Self-pay | Admitting: Medical

## 2011-05-10 MED ORDER — ROSUVASTATIN 20 MG TABLET
20.00 mg | ORAL_TABLET | Freq: Every evening | ORAL | Status: DC
Start: 2011-05-10 — End: 2013-03-06

## 2011-05-10 MED ORDER — METOPROLOL TARTRATE 25 MG TABLET
25.0000 mg | ORAL_TABLET | Freq: Two times a day (BID) | ORAL | Status: DC
Start: 2011-05-10 — End: 2015-03-24

## 2011-05-10 MED ORDER — NITROGLYCERIN 0.4 MG SUBLINGUAL TABLET
SUBLINGUAL_TABLET | SUBLINGUAL | Status: DC
Start: 2011-05-10 — End: 2012-07-06

## 2011-05-10 NOTE — Telephone Encounter (Signed)
Received Spencer [name]'s lipid panel results from 04/01/11      AST and ALT both normal.     Lab Results   Component Value Date    TRIG 176 04/01/11    HDLCHOL 43.7 04/01/11    LDLCHOL 164.1 04/01/11    CHOLESTEROL 243 04/01/11        Patient on Zocor 20mg  daily.  Phoned patient, Dr. Myer Peer would like to start Crestor 20mg  daily.  Patient agreeable, prescription E-Rx'ed to his pharmacy.   Lipid letter sent to patient, as well as a lab slip for FLP/AST/ALT in 4-6 weeks.  Patient verbalized understanding.  All questions/concerns were addressed.

## 2011-05-12 ENCOUNTER — Encounter (INDEPENDENT_AMBULATORY_CARE_PROVIDER_SITE_OTHER): Payer: Self-pay | Admitting: Internal Medicine

## 2011-05-12 NOTE — Progress Notes (Signed)
Entered labs from [facility] drawn on 04/01/11.  "SEE LABS".

## 2011-06-30 ENCOUNTER — Ambulatory Visit (INDEPENDENT_AMBULATORY_CARE_PROVIDER_SITE_OTHER): Admitting: Urology

## 2011-07-15 ENCOUNTER — Ambulatory Visit
Admission: RE | Admit: 2011-07-15 | Discharge: 2011-07-15 | Disposition: A | Discharge status: Home / Self Care | Source: Ambulatory Visit | Attending: Urology | Admitting: Urology

## 2011-07-15 ENCOUNTER — Ambulatory Visit (HOSPITAL_BASED_OUTPATIENT_CLINIC_OR_DEPARTMENT_OTHER)

## 2011-07-15 ENCOUNTER — Encounter (INDEPENDENT_AMBULATORY_CARE_PROVIDER_SITE_OTHER): Payer: Self-pay | Admitting: Urology

## 2011-07-15 ENCOUNTER — Ambulatory Visit (HOSPITAL_BASED_OUTPATIENT_CLINIC_OR_DEPARTMENT_OTHER): Admitting: Urology

## 2011-07-15 ENCOUNTER — Encounter (INDEPENDENT_AMBULATORY_CARE_PROVIDER_SITE_OTHER): Payer: Self-pay

## 2011-07-15 ENCOUNTER — Telehealth (INDEPENDENT_AMBULATORY_CARE_PROVIDER_SITE_OTHER): Payer: Self-pay

## 2011-07-15 VITALS — BP 120/80 | HR 64 | Temp 97.0°F | Ht 71.0 in | Wt 211.0 lb

## 2011-07-15 DIAGNOSIS — Z125 Encounter for screening for malignant neoplasm of prostate: Secondary | ICD-10-CM | POA: Insufficient documentation

## 2011-07-15 DIAGNOSIS — N400 Enlarged prostate without lower urinary tract symptoms: Secondary | ICD-10-CM

## 2011-07-15 DIAGNOSIS — R109 Unspecified abdominal pain: Secondary | ICD-10-CM | POA: Insufficient documentation

## 2011-07-15 DIAGNOSIS — R112 Nausea with vomiting, unspecified: Secondary | ICD-10-CM | POA: Insufficient documentation

## 2011-07-15 DIAGNOSIS — R319 Hematuria, unspecified: Secondary | ICD-10-CM

## 2011-07-15 DIAGNOSIS — K219 Gastro-esophageal reflux disease without esophagitis: Secondary | ICD-10-CM

## 2011-07-15 DIAGNOSIS — K5090 Crohn's disease, unspecified, without complications: Secondary | ICD-10-CM | POA: Insufficient documentation

## 2011-07-15 HISTORY — DX: Benign prostatic hyperplasia without lower urinary tract symptoms: N40.0

## 2011-07-15 LAB — AST (SGOT): AST (SGOT): 18 U/L (ref 8–48)

## 2011-07-15 LAB — BILIRUBIN, TOTAL/CONJ: BILIRUBIN,CONJUGATED: <0.2 mg/dL (ref 0.0–0.3)

## 2011-07-15 LAB — LIPASE: LIPASE: 85 U/L — ABNORMAL HIGH (ref 6–51)

## 2011-07-15 LAB — PSA SCREENING: PROSTATE SPECIFIC AG: 0.4 ng/mL (ref 0.0–4.0)

## 2011-07-15 LAB — ALT (SGPT): ALT (SGPT): 18 U/L (ref 7–55)

## 2011-07-15 LAB — ALBUMIN: ALBUMIN: 4 g/dL (ref 3.5–4.8)

## 2011-07-15 NOTE — Progress Notes (Signed)
CC: Follow-up    S:  45 yo AA gentleman with a H/O BPH and microhematuria-S/P negative urologic work-up in January, 2012.  Also H/O Chrohn's disease, patient stated that he thinks that he is having a flare up today. He is going to GI clinic today after his visit here (he presented to [facility] ER yesterday).                     O:                              General- WDWN gentleman in NAD                         HEENT- Grossly intact                         Chest- Lungs CTA bilaterally                         Heart- RRR without murmurs or gallops noted                         Abdomen- Soft & NT, no masses noted                             Back- No CVAT                         GU- Deferred                         Rectal- Deferred                         Extremities: Grossly intact, FROM x 4                         Neurologic- Grossly intact, alert & oriented x 4                   Labs:       U/A-  Urine Dip Results:   Time collected: 0942  Glucose: Negative  Bilirubin: Negative  Ketones: Negative  Specific Gravity: 1.030  Blood: Moderate (2+)  pH: 6.0  Protein: Negative  Urobilinogen: Normal   Nitrite: Negative  Leukocytes: Negative                   Diagnostic Studies-    Final      CT ABDOMEN PELVIS W IV CONTRAST         [facility]   EXAMINATION: CT scan abdomen and pelvis. History is abdominal pain, left lower quadrant with a history of Crohn disease.   FINDINGS: Multiplanar IV and oral contrast-enhanced CT imaging is reviewed. The right lung base shows some atelectasis.   Liver, spleen, pancreas, gallbladder and kidneys all appear unremarkable.   There is no evidence for bowel obstruction. No abdominal inflammatory changes are seen. There is no specific abnormality of large or small bowel seen. There is no evidence for diverticulitis.   IMPRESSION:   Unremarkable exam, no specific abnormality is seen.       Dictation on 09/21/2010 11:43 AM by Sudie Grumbling, MD  This study was interpreted by:  Sudie Grumbling, MD      This report has been reviewed and released by:  Signed by: Sudie Grumbling, MD on Tue Sep 21, 2010 04:29:00 PM EDT         A:   1) BPH- mild    2) Prostate cancer screen-annual   3) Microhematuria (chronic)-S/P negative work-up   4) Crohn's dz      P:  1) Check PSA today.   2) Voided urine for cytology.  3) RTC in 6 months (sooner if having problems).      Erven Colla., MD, FACS  Assistant Professor   Division of Urology  [facility]

## 2011-07-15 NOTE — Telephone Encounter (Signed)
Called pt w/ his LFT, isolated hyperbilirubinemia (unconjugated) as before, ? Gilbert's vs other.  Celiac pending.  He will have u/s and SBFT and abdominal duplex done as schedule.  Answered his questions.

## 2011-07-15 NOTE — Progress Notes (Addendum)
Requesting Physician: Self, Referral  Reason for referral: abdominal pain  History of Present Illness  Spencer [name] is a 45 y.o. male who presents with a chief complaint of abdominal pain to clinic. H/o CAD s/p MI, HLD, GERD, PAD, diet controlled DM, BPH, PTSD, s/p laparoscopy for his abdominal pain in 2005.  He c/o LMQ abdominal pain started in 2005 after he came back from [place] (he served 1 yr there).  Sharp pain, then diffusely, intermittent, up to 10/10 pain.  Sometimes relieved with watery diarrhea, non bloody.  This pain could happen 1-2 hrs after eating per him.  Not related to milk (he only takes 1 cup a day) or cheese.  Pain progressively worsen, and now one episode per month.  Also c/o baseline abdominal pain now, 2-3/10 pain.  He usually has 1-2 BMs per day, formed, no melena or hematochezia.  His BMs relieve some of his abdominal pain.    He c/o weight loss about 15 lb in the past 2 mns since he worries eating gives him abdominal and diarrhea.  He also changed his diet habit after seeing PCP, such as avoiding spicy, greasy foods.  No routine carbonated beverage.  He c/o reflux, heartburn for several years, but has improved lately.  No current reflux, heartburn.  He is on omeprazole OCT, but takes at bedtime.  No odynophagia or dysphagia.  He had EGD done in 2005 in [facility], which was fine per him.  He has seen multiple hospital for his abdominal pain, and went to [facility] in 2005 and later [facility].  He has a laparoscopy done in [facility] which was not revealing.  Had multiple colonoscopic done.  He had similar complain in 08/2010, and he was admitted for his abdominal pain.  GI was c/s.  His CRP, CBC, BMP were normal.  ANA normal.  He received colonoscopy on 09/25/11, which showed normal colonic mucosa and pathology was benign.  He also received CT abd, which was unremarkable.  Per d/c summary, his colonoscopy in [facility] and [facility] showed non specific chronic inflammation at that  time, and may be ?early stage of Crohn's.  He was not on any medication for Crohn's.  No FMH of IBD.  He went to [facility] yesterday for similar complain, and had blood work done, which was normal per him, except some elevation of bilirubin.  He was given pain medication and sent home.  Not on Levsin before. He was on metamucil, which did not help.  Colonoscopy 09/24/2010  ENDOSCOPIC IMPRESSION:  1) Normal ileum   2) Normal colonic mucosa   3) Normal colonoscopy otherwise     Pathology:  Final Pathologic Diagnosis  A. TERMINAL ILEUM, BIOPSY:  - No diagnostic abnormalities recognized.    B. CECUM/ASCENDING COLON, BIOPSY:  - No diagnostic abnormalities recognized.    C. MID COLON, BIOPSY:  - No diagnostic abnormalities recognized.    D. LEFT COLON, BIOPSY:  - No diagnostic abnormalities recognized.    CT 09/21/2010  IMPRESSION:   Unremarkable exam, no specific abnormality is seen.  Past History  Current Outpatient Prescriptions   Medication Sig   . SIMVASTATIN ORAL take  by mouth.     . DOXEPIN HCL (DOXEPIN ORAL) take  by mouth.     . metoprolol (LOPRESSOR) 25 mg Oral Tablet take 1 Tab by mouth Twice daily.   . nitroglycerin (NITROSTAT) 0.4 mg Sublingual Tablet, Sublingual for 3 doses over 15 minutes   . rosuvastatin (CRESTOR) 20 mg Oral  Tablet take 1 Tab by mouth QPM.   . Gabapentin (NEURONTIN) 400 mg Oral Capsule take 1 Cap by mouth every night.   Marland Kitchen aspirin 325 mg Tab take 325 mg by mouth Once a day.     Allergies   Allergen Reactions   . Adhesive      Red rash     Past Medical History   Diagnosis Date   . Colitis    . 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 Diabetes mellitus      borderline   . Headache    . Reflux    . Chest pain 12/23/2007   . Borderline diabetes mellitus 12/23/2007   . Spells 06/16/2008   . Stress test 05/28/2010   . S/P cardiac catheterization 05/28/2010   . PAD (peripheral artery disease) 06/03/2010   . Hypertriglyceridemia 06/03/2010   . Anginal pain 06/03/2010   . CAD (coronary artery disease) 06/03/2010   .  Crohn's disease 06/14/2010   . IBS (irritable bowel syndrome) 06/14/2010   . GERD (gastroesophageal reflux disease) 06/14/2010   . Hyperlipidemia 12/26/2007     denies cp sob or mi hx   . Post traumatic stress disorder (PTSD)      currently being evaluated   . Type 2 diabetes mellitus      borderline     Past Surgical History   Procedure Date   . Pb colonoscopy,biopsy    . Hx laparotomy    . Hx heart catheterization    . Colonoscopy 09/24/2010     COLONOSCOPY performed by [name] at [facility] OR ENDO     Family History  Family History   Problem Relation Age of Onset   . Hypertension Mother    . Hypertension Brother    . Diabetes Brother    . Hypertension Brother    No FMH of colon cancer, liver diseases, or IBD  Social History  History     Social History   . Marital Status: Married     Spouse Name: Alvy Beal     Number of Children: 4   . Years of Education: N/A     Occupational History   .  [employer]     Social History Main Topics   . Smoking status: Never Smoker    . Smokeless tobacco: Not on file   . Alcohol Use: No   . Drug Use: No   . Sexually Active: Not on file     Other Topics Concern   . Ability To Walk 2 Flight Of Steps Without Sob/Cp Yes     Social History Narrative   . 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  No alcohol, no smoking or IVDA.  Married with 6 kids.    Review of Systems  Constitutional: negative for fever, chills and +weight loss  Eyes: negative for visual disturbance and icterus  Respiratory: negative for cough, hemoptysis or dyspnea on exertion  Cardiovascular: negative for chest pain, chest pressure/discomfort  Gastrointestinal: see HPI  Genitourinary:negative for frequency, dysuria and hematuria  Musculoskeletal: negative for arthralgias  Neurological: negative for weakness or loss of sensation  Psych: +PTSD  Derm: no skin rash  Lymph: no cervical or inguinal LAD  Examination  Vitals: BP 120/80  Pulse 64  Temp(Src) 36.1 C (97 F) (Tympanic)  Ht 1.803 m (5'  11")  Wt 95.709 kg (211 lb)  BMI 29.43 kg/m2  SpO2 97%  Gen: NAD,  AAOX3  HEENT: PERLA, moist mucosa, no erythema in mucosa, no scleral icterus  Lung: CTAB, no dullness to percussion.  Heart: No JVD. RRR, no murmur/gallop or rubs.   Abd: soft, ND, TTP diffusely and worse on LMQ, no rebound/guarding, no hepatosplenomegaly. No fluid wave noted.  Ext: no edema. No cyanosis.    Neuro: CN II- XII is intact. Sensation is intact. 5+ strength in all ext. No asterixis  Derm: no skin rash.  Lymph: no cervical, inguinal LAD noted.    Impression  1. GERD (gastroesophageal reflux disease)    2. Nausea and vomiting    3. Abdominal pain    45 yo male w/ H/o CAD s/p MI, HLD, GERD, PAD, diet controlled DM, BPH, PTSD, s/p laparoscopy for his abdominal pain in 2005, comes in for his abdominal pain/diarrhea.  Recommendations:  1. Abdominal pain  - less likely to be Crohn's with recent normal Colonoscopy and normal CRP.  DDX including mesenteric ischemia with h/o PAD/CAD, biliary diseases, celiac disease or IBS.  Also consider PUD/reflux.  - will check LFT and celiac panel  - will check abdominal duplex to evaluate vasculature patency  - will check RUQ u/s to evaluate his gallbladder  - will check SBFT to complete his IBD w/u which was not done during his last admission  - d/w pt to continue his treatment for his PTSD  - d/w pt for life style modification for reflux.  Will continue his omeprazole, and advise pt to take it before meal  - will consider EGD if all w/u negative    RTC 3 mns  Patient was seen with Dr. Boyd Kerbs, who agrees with the plan.    Uzziah Rigg Gwendolyn Grant, MD  Fellow, Section for Digestive Diseases  [facility]  I saw and evaluated the patient. I reviewed the resident's /fellow's note and agree with the documented findings and plan of care.   Knox Saliva, MD   Assistant Professor of Medicine   Section of Digestive Diseases   [facility]   07/15/2011 7:04 PM

## 2011-07-16 LAB — TISSUE TRANSGLUTAMINASE (TTG) ANTIBODY, IGA, SERUM (MAYO): TISSUE TRANSGLUTAMINASE (TTG) ANTIBODIES, IGA, SERUM: <1.2

## 2011-07-16 LAB — DEAMIDATED GLIADIN PEPTIDE (DGP) IGA & IGG PROFILE
GLIADIN (DEAMIDATED) ANTIBODIES EVALUATION, IGA, SERUM: <10
GLIADIN (DEAMIDATED) ANTIBODIES EVALUATION, IGG, SERUM: <10

## 2011-07-18 LAB — IMMUNOGLOBULIN A (IGA), SERUM: IMMUNOGLOBULIN A: 440 mg/dL — ABNORMAL HIGH (ref 69–309)

## 2011-07-19 LAB — ENDOMYSIAL ANTIBODIES (IGA), SERUM: ENDOMYSIAL ABS: NEGATIVE

## 2011-07-20 ENCOUNTER — Ambulatory Visit (HOSPITAL_COMMUNITY)
Admission: RE | Admit: 2011-07-20 | Discharge: 2011-07-20 | Disposition: A | Discharge status: Home / Self Care | Source: Ambulatory Visit | Attending: Gastroenterology-BA | Admitting: Gastroenterology-BA

## 2011-07-20 ENCOUNTER — Ambulatory Visit
Admission: RE | Admit: 2011-07-20 | Discharge: 2011-07-20 | Disposition: A | Discharge status: Home / Self Care | Source: Ambulatory Visit | Attending: Gastroenterology-BA | Admitting: Gastroenterology-BA

## 2011-07-20 ENCOUNTER — Telehealth (INDEPENDENT_AMBULATORY_CARE_PROVIDER_SITE_OTHER): Payer: Self-pay

## 2011-07-20 DIAGNOSIS — R109 Unspecified abdominal pain: Secondary | ICD-10-CM

## 2011-07-20 NOTE — Procedures (Addendum)
 [facility]  Cardiovascular and Interventional Services  Abdominal Aortoiliac Duplex Evaluation    Name: Spencer [name], 45 y.o., male    MRN: [id number]   DOB: 05/14/1966  abd pain  Referring Physician: Waverly Pleas  Performing Tech:  Damien Holt, RTR    Date of Service: 07/20/2011            ADELINENV=Not Visualized)    Infrarenal Aorta, AP and Transverse Diameters  Proximal A->P cm: 1.8  Proximal R->L cm: 2.3  Proximal Velocity cm/s: 95  Mid A->P cm: 1.5  Mid R->L cm: 1.4  Mid Velocity cm/s: 94  Distal A->P cm: 1.7  Distal R->L cm: 1.8  Distal Velocity cm/s: 109  Aortic Flow cm/sec: 95    Right Iliac Artery, AP & Tranverse Diameters  Right A->P Iliac ART cm: 1.0  Right R->L Iliac ART cm: 1.2  Velocity cm/s: 94    Left Iliac Artery, AP & Transverse Diameters  Left A->P Iliac ART cm: 1.0  Left R->L Iliac ART cm: 1.3  Left Velocity cm/s: 72                                                Performing Tech:  Damien Holt 07/20/2011, 9:14 AM      Reviewing Sonographer Impression:       No aneurysmal dilatation of the abdominal aorta. No aneurysmal dilatation of the right  or left iliac arteries. Flow velocities were normal.      [name], RVS 07/20/2011, 9:43 AM      This is a preliminary report until verified by a physician.         I have reviewed this non invasive vascular examination.    Conclusion:      No aneurysmal dilatation of the abdominal aorta. No aneurysmal dilatation of the right  or left iliac arteries. Flow velocities were normal.      [name], MD 07/22/2011, 2:44 PM

## 2011-07-20 NOTE — Telephone Encounter (Signed)
Called pt regarding his U/S and abd duplex.    U/S on 8/22:  IMPRESSION:   1. Unremarkable ultrasound examination of the gallbladder and biliary tree.   2. Liver is normal in size and the echogenicity appears slightly increased, suggesting mild fatty change.  Abd duplex  Reviewing Sonographer Impression:   No aneurysmal dilatation of the abdominal aorta. No aneurysmal   dilatation of the right or left iliac arteries. Flow velocities   were normal.    His Celiac panel is normal.    Mild elevated lipase, no drinking/gallstone per history and U/S, and will recheck in 1 mn.  Will consider w/u if still elevated.  Answer his questions.  D/w Dr. Boyd Kerbs for his elevation of lipase.

## 2011-07-21 ENCOUNTER — Ambulatory Visit
Admission: RE | Admit: 2011-07-21 | Discharge: 2011-07-21 | Disposition: A | Discharge status: Home / Self Care | Source: Ambulatory Visit | Attending: Gastroenterology-BA | Admitting: Gastroenterology-BA

## 2011-07-21 DIAGNOSIS — R109 Unspecified abdominal pain: Secondary | ICD-10-CM | POA: Insufficient documentation

## 2011-07-21 MED ORDER — BARIUM-METHYLCELLULOSE 13 % ORAL LIQUID
355.00 mL | ORAL | Status: AC
Start: 2011-07-21 — End: 2011-07-21
  Administered 2011-07-21: 355 mL via ORAL

## 2011-07-21 MED ORDER — SODIUM BICARB-TARTARIC AC 0.46 GRAM-0.42 GRAM/G EFFERVESCENT GRANULES
3.00 g | GRANULES | ORAL | Status: AC
Start: 2011-07-21 — End: 2011-07-21
  Administered 2011-07-21: 3 g via ORAL

## 2011-07-21 MED ORDER — BARIUM SULFATE 98 % ORAL SUSPENSION
140.00 mL | ORAL | Status: AC
Start: 2011-07-21 — End: 2011-07-21
  Administered 2011-07-21: 140 mL via ORAL

## 2011-07-21 MED ORDER — BARIUM SULFATE 60 % (W/V) ORAL SUSPENSION
105.00 mL | ORAL | Status: AC
Start: 2011-07-21 — End: 2011-07-21
  Administered 2011-07-21: 105 mL via ORAL

## 2011-10-21 ENCOUNTER — Emergency Department (EMERGENCY_DEPARTMENT_HOSPITAL)

## 2011-10-21 ENCOUNTER — Observation Stay
Admission: EM | Admit: 2011-10-21 | Discharge: 2011-10-22 | Disposition: A | Discharge status: Home / Self Care | Attending: Internal Medicine | Admitting: Internal Medicine

## 2011-10-21 ENCOUNTER — Inpatient Hospital Stay (HOSPITAL_COMMUNITY): Admitting: Internal Medicine

## 2011-10-21 DIAGNOSIS — Z7982 Long term (current) use of aspirin: Secondary | ICD-10-CM | POA: Insufficient documentation

## 2011-10-21 DIAGNOSIS — R079 Chest pain, unspecified: Principal | ICD-10-CM | POA: Insufficient documentation

## 2011-10-21 DIAGNOSIS — E785 Hyperlipidemia, unspecified: Secondary | ICD-10-CM | POA: Insufficient documentation

## 2011-10-21 DIAGNOSIS — I2510 Atherosclerotic heart disease of native coronary artery without angina pectoris: Secondary | ICD-10-CM | POA: Insufficient documentation

## 2011-10-21 DIAGNOSIS — I10 Essential (primary) hypertension: Secondary | ICD-10-CM | POA: Insufficient documentation

## 2011-10-21 HISTORY — DX: Unspecified osteoarthritis, unspecified site: M19.90

## 2011-10-21 LAB — CBC/DIFF
BASOPHILS: 0 % (ref 0–1)
EOS ABS: 0.15 THOU/uL (ref 0.1–0.3)
EOSINOPHIL: 2 % (ref 1–6)
HGB: 15.1 g/dL (ref 13.1–17.3)
LYMPHOCYTES: 69 % — ABNORMAL HIGH (ref 20–45)
LYMPHS ABS: 5.175 THOU/uL — ABNORMAL HIGH (ref 1.0–4.8)
MCH: 31 pg (ref 27.4–33.0)
MCHC: 36.4 g/dL — ABNORMAL HIGH (ref 31.6–35.5)
MCV: 85.1 fL (ref 82.0–99.0)
MONOCYTES: 6 % (ref 4–13)
MONOS ABS: 0.45 THOU/uL (ref 0.1–0.9)
MPV: 7.5 FL (ref 7.4–10.4)
PLATELET COUNT: 230 THOU/uL (ref 140–450)
PMN ABS: 1.725 THOU/uL (ref 1.5–7.7)
PMN'S: 23 % — ABNORMAL LOW (ref 40–75)
RBC: 4.86 MIL/uL (ref 4.46–5.70)
RDW: 11.6 % (ref 10.2–14.0)
WBC: 7.5 THOU/uL (ref 3.5–11.0)

## 2011-10-21 LAB — BASIC METABOLIC PANEL
ANION GAP: 7 mmol/L (ref 5–16)
BUN/CREAT RATIO: 9 (ref 6–22)
BUN: 9 mg/dL (ref 8–26)
CALCIUM: 9.2 mg/dL (ref 8.5–10.4)
CARBON DIOXIDE: 27 mmol/L (ref 23–33)
CHLORIDE: 103 mmol/L (ref 96–111)
CREATININE: 1.02 mg/dL (ref 0.62–1.27)
ESTIMATED GLOMERULAR FILTRATION RATE: >59 ml/min/1.73m2 (ref >59)
GLUCOSE,NONFAST: 119 mg/dL (ref 65–139)
POTASSIUM: 3.6 mmol/L (ref 3.5–5.1)
SODIUM: 137 mmol/L (ref 136–145)

## 2011-10-21 LAB — TROPONIN-I (FOR ED ONLY): TROPONIN-I: <0.01 ng/mL (ref <0.030)

## 2011-10-21 LAB — PTT (PARTIAL THROMBOPLASTIN TIME): APTT: 25.6 s (ref 22.0–32.0)

## 2011-10-21 LAB — PT/INR
INR: 1 (ref 0.8–1.2)
PROTHROMBIN TIME: 10.5 s (ref 9.1–11.5)

## 2011-10-21 MED ORDER — NITROGLYCERIN 0.4 MG SUBLINGUAL TABLET
0.40 mg | SUBLINGUAL_TABLET | SUBLINGUAL | Status: DC | PRN
Start: 2011-10-21 — End: 2011-10-22
  Administered 2011-10-21: 0.4 mg via SUBLINGUAL

## 2011-10-21 MED ORDER — ASPIRIN 81 MG CHEWABLE TABLET
324.0000 mg | CHEWABLE_TABLET | Freq: Once | ORAL | Status: AC
Start: 2011-10-22 — End: 2011-10-21

## 2011-10-21 MED ORDER — ASPIRIN 81 MG CHEWABLE TABLET
CHEWABLE_TABLET | ORAL | Status: AC
Start: 2011-10-21 — End: 2011-10-21
  Administered 2011-10-21: 324 mg via ORAL
  Filled 2011-10-21: qty 4

## 2011-10-21 MED ORDER — NITROGLYCERIN 0.4 MG SUBLINGUAL TABLET
SUBLINGUAL_TABLET | SUBLINGUAL | Status: AC
Start: 2011-10-21 — End: 2011-10-21
  Administered 2011-10-21: 0.4 mg via SUBLINGUAL
  Filled 2011-10-21: qty 1

## 2011-10-21 NOTE — ED Provider Notes (Signed)
HPI  Spencer [name] is a 45 y.o. 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 The patient describes that he experiences pressure like chest pain on a regular basis but that the past few days the pressure has gotten worse. He states that yesterday and today he has also experienced a sharp stabbing left sided pain that radiates into his left neck and arm and also to his right chest. He states that during these episodes he has had periodic sweating and SOB. He states that he feels like he has gotten SOB worsening to the point that he cannot walk 20-30 yards without havign to take a break to catch his breath. Patient denies cough, fever, weakness, nausea or vomiting. He does report that he has experienced lightheadedness with these episodes. He states that this pain is the same as the pain that he felt in 2009 when he needed to get a heart cath.        Review of Systems  Constitutional: No fever, chills or weakness   Skin: No rash, + diaphoresis  HENT: No headaches or congestion  Eyes: No vision changes   Cardio: + chest pain, no palpitations or leg swelling   Respiratory: No cough or wheezing, + SOB  GI:  No nausea, vomiting or stool changes  GU:  No urinary changes  MSK: No joint or back pain  Neuro: No seizures or LOC  Psychiatric: No depression, SI or substance abuse  All other systems reviewed and are negative.      History:   PMH:    Past Medical History   Diagnosis Date   . Colitis    . 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 Diabetes mellitus      borderline   . Headache    . Reflux    . Chest pain 12/23/2007   . Borderline diabetes mellitus 12/23/2007   . Spells 06/16/2008   . Stress test 05/28/2010   . S/P cardiac catheterization 05/28/2010   . PAD (peripheral artery disease) 06/03/2010   . Hypertriglyceridemia 06/03/2010    . Anginal pain 06/03/2010   . CAD (coronary artery disease) 06/03/2010   . Crohn's disease 06/14/2010   . IBS (irritable bowel syndrome) 06/14/2010   . GERD (gastroesophageal reflux disease) 06/14/2010   . Hyperlipidemia 12/26/2007     denies cp sob or mi hx   . Post traumatic stress disorder (PTSD)      currently being evaluated   . Type 2 diabetes mellitus      borderline     PSH:    Past Surgical History   Procedure Date   . Pb colonoscopy,biopsy    . Hx laparotomy    . Hx heart catheterization    . Colonoscopy 09/24/2010     COLONOSCOPY performed by [name] at [facility] OR ENDO     Social Hx:    History     Social History   . Marital Status: Married     Spouse Name: Alvy Beal     Number of Children: 4   . Years of Education: N/A     Occupational History   .  [employer]     Social History Main Topics   . Smoking status: Never Smoker    . Smokeless tobacco:  Not on file   . Alcohol Use: No   . Drug Use: No   . Sexually Active: Not on file     Other Topics Concern   . Ability To Walk 2 Flight Of Steps Without Sob/Cp Yes     Social History Narrative   . No narrative on file     Family Hx:   Family History   Problem Relation Age of Onset   . Hypertension Mother    . Hypertension Brother    . Diabetes Brother    . Hypertension Brother      Allergies:   Allergies   Allergen Reactions   . Adhesive      Red rash       Above history reviewed with patient, changes are as documented.      Physical Exam  Nursing notes reviewed.    ED Triage Vitals   Enc Vitals Group      BP (Non-Invasive) 10/21/11 2248 146/90 mmHg      Heart Rate 10/21/11 2248 83       Respiratory Rate 10/21/11 2248 18       Temperature 10/21/11 2248 36.6 C (97.9 F)      Temp src --       SpO2-1 10/21/11 2248 97 %      Weight 10/21/11 2248 95.255 kg (210 lb)      Height 10/21/11 2248 1.778 m (5\' 10" )      Head Cir --       Peak Flow --       Pain Score --       Pain Loc --       Pain Edu? --       Excl. in GC? --      Constitutional: NAD. Oriented   HENT:   Head: Normocephalic and atraumatic.   Mouth/Throat: Oropharynx is clear and moist.   Eyes: EOMI, PERRL   Neck: Trachea midline. Neck supple.  Cardiovascular: RRR, No murmurs, rubs or gallops. Intact distal pulses.  Pulmonary/Chest: BS equal bilaterally. No respiratory distress. No wheezes, rales or chest tenderness.   Abdominal: BS +. Abdomen soft, no tenderness, rebound or guarding.               Musculoskeletal: No edema, tenderness or deformity.  Skin: warm and dry. No rash, erythema, pallor or cyanosis  Psychiatric: normal mood and affect. Behavior is normal.   Neurological: Alert. Grossly intact.        Course  MDM      Impression/Plan:   Niccolas [name] is a 45 y.o. male with complaints of chest pain, concerning for ACS vs atypical angina vs pneumonia/viral URI symptoms. Will order CBC, BMP, PT/INR, PTT, troponin, EKG, CXR, Aspirin and nitroglycerin. Will re-examine and re-evaluate the patient after medications are given, labs and imagining are completed.     Orders Placed This Encounter   . XR AP MOBILE CHEST   . CBC/DIFF   . BASIC METABOLIC PANEL, NON-FASTING   . PT/INR   . PTT (PARTIAL THROMBOPLASTIN TIME)   . TROPONIN-I (FOR ED ONLY)   . ECG 12-LEAD   . INSERT & MAINTAIN PERIPHERAL IV ACCESS   . aspirin chewable tablet ---[employer]   . nitroglycerin (NITROSTAT) sublingual tablet ---[employer] Reviewed   CBC/DIFF - Abnormal; Notable for the following:     MCHC 36.4 (*)      PMN'S 23 (*)  LYMPHOCYTES 69 (*)      LYMPHS ABS 5.175 (*)      All other components within normal limits   BASIC METABOLIC PANEL, NON-FASTING   PT/INR   PTT (PARTIAL THROMBOPLASTIN TIME)   TROPONIN-I (FOR ED ONLY)     All labs were reviewed.    Radiographical Imaging: CXR showing no acute cardiopulmonary process  EKG showing NSR   Medical Records reviewed. On re-examination the patient was still complaining of mild chest pain and reporting that the nitroglycerin resulted in a severe headache. The plan to admit the patient to medicine for chest pain rule out was discussed and the patient agreed to the plan. He was given morphine 2 mg and zofran 4 mg, which controlled the patient's pain. Medicine was consulted for admission. Medicine evaluated the patient and admitted him. He was monitored in the ED and remained stable until transferred to an inpatient room.      Rachel Moulds, MD 10/22/2011, 4:14 AM

## 2011-10-21 NOTE — ED Nurses Note (Signed)
 Pt c/o pain 2/10. MD made aware.

## 2011-10-21 NOTE — ED Nurses Note (Signed)
 Pt presents to ED with chest pain x 2 weeks with intermittent SOB. Pt c/o dizziness with episodes of SOB. Pain located in L chest with radiation at times to L neck and R upper chest. Pt denies SOB and dizziness during eval. Pain 4/10. Pt has hx of cardiac cath s/p reported MI. EKG and chest x-ray at bedside. 18g PIV to L AC. Labs drawn and walked to lab. Pt placed on monitor. Awaiting further orders

## 2011-10-21 NOTE — ED Attending Note (Signed)
Note begun by:  Jesse Sans, MD 10/21/2011, 11:42 PM    I was physically present and directly supervised this patient's care.  Patient seen and examined.  Resident / Trixie Dredge / NP history and exam reviewed.   Key elements in addition to and/or correction of that documentation are as follows:    HPI :    45 y.o. male presents with chief complaint of chest pain. He says in 2009 he had a cath and he had a 30% blockage with no intervention. He says he gets intermittent chest pain since then but for the past 2 weeks has had increased CP along with DOE and sweats. The CP radiates to his neck and his L fingers. It is sharp at times.     PE :   VS on presentation: Blood pressure 123/81, pulse 93, temperature 36.6 C (97.9 F), resp. rate 18, height 1.778 m (5\' 10" ), weight 95.255 kg (210 lb), SpO2 94.00%.  Pt is in no distress, heart RRR, lungs clear, abd soft and nontender. Ext with trace edema and no tenderness.     Data/Test :          Review of Prior Data :             Clinical Impression :     Chest pain  MDM :       ED Course :        Plan :   Will call medicine for admission.     CRITICAL CARE : None

## 2011-10-22 ENCOUNTER — Encounter (HOSPITAL_COMMUNITY): Payer: Self-pay

## 2011-10-22 LAB — LIPID PANEL
CHOLESTEROL: 184 mg/dL (ref <200)
CHOLESTEROL: 196 mg/dL (ref <200)
HDL-CHOLESTEROL: 38 mg/dL — ABNORMAL LOW (ref >39)
HDL-CHOLESTEROL: 38 mg/dL — ABNORMAL LOW (ref >39)
LDL (CALCULATED): 115 mg/dL — ABNORMAL HIGH (ref <100)
LDL (CALCULATED): 126 mg/dL — ABNORMAL HIGH (ref <100)
NON - HDL (CALCULATED): 146 mg/dL (ref <190)
NON - HDL (CALCULATED): 158 mg/dL (ref <190)
TRIGLYCERIDES: 156 mg/dL — ABNORMAL HIGH (ref <150)
TRIGLYCERIDES: 160 mg/dL — ABNORMAL HIGH (ref <150)
VLDL (CALCULATED): 31 mg/dL — ABNORMAL HIGH (ref <30)
VLDL (CALCULATED): 32 mg/dL — ABNORMAL HIGH (ref <30)

## 2011-10-22 LAB — DRUG SCREEN, HIGH OPIATE CUTOFF, NO CONFIRMATION, URINE
AMPHETAMINE, URINE: NEGATIVE
BARBITURATE, URINE: NEGATIVE
BENZODIAZEPINE,URINE: NEGATIVE
CANNABINOID (THC), URINE: NEGATIVE
COCAINE METAB. URINE: NEGATIVE
METHADONE, URINE: NEGATIVE
OPIATE, URINE: POSITIVE — AB
PHENCYCLIDINE, URINE: NEGATIVE
PROPOXYPHENE, URINE: NEGATIVE

## 2011-10-22 LAB — CREATINE KINASE (CK), MB FRACTION, SERUM
CK-MB: 0.4 ng/mL (ref <6.4)
CK-MB: 0.5 ng/mL (ref <6.4)
MB INDEX: 0.3 % (ref 0.0–5.0)
MB INDEX: 0.4 % (ref 0.0–5.0)

## 2011-10-22 LAB — CREATINE KINASE (CK), TOTAL, SERUM OR PLASMA
CREATINE KINASE (CK): 132 U/L (ref 48–222)
CREATINE KINASE (CK): 118 U/L (ref 48–222)

## 2011-10-22 LAB — TROPONIN-I
TROPONIN-I: <0.01 ng/mL (ref <0.030)
TROPONIN-I: <0.01 ng/mL (ref <0.030)

## 2011-10-22 MED ORDER — SODIUM CHLORIDE 0.9 % (FLUSH) INJECTION SYRINGE
2.0000 mL | INJECTION | INTRAMUSCULAR | Status: DC | PRN
Start: 2011-10-22 — End: 2011-10-22

## 2011-10-22 MED ORDER — SODIUM CHLORIDE 0.9 % (FLUSH) INJECTION SYRINGE
2.0000 mL | INJECTION | Freq: Three times a day (TID) | INTRAMUSCULAR | Status: DC
Start: 2011-10-22 — End: 2011-10-22
  Administered 2011-10-22 (×2): 2 mL

## 2011-10-22 MED ORDER — GABAPENTIN 100 MG CAPSULE
400.00 mg | ORAL_CAPSULE | Freq: Every evening | ORAL | Status: DC
Start: 2011-10-22 — End: 2011-10-22

## 2011-10-22 MED ORDER — FENTANYL (PF) 50 MCG/ML INJECTION SOLUTION
25.00 ug | INTRAMUSCULAR | Status: DC | PRN
Start: 2011-10-22 — End: 2011-10-22

## 2011-10-22 MED ORDER — HEPARIN (PORCINE) 5,000 UNIT/ML INJECTION SOLUTION
5000.0000 [IU] | Freq: Three times a day (TID) | INTRAMUSCULAR | Status: DC
Start: 2011-10-22 — End: 2011-10-22
  Administered 2011-10-22 (×2): 5000 [IU] via SUBCUTANEOUS
  Filled 2011-10-22 (×4): qty 1

## 2011-10-22 MED ORDER — ISOSORBIDE MONONITRATE ER 30 MG TABLET,EXTENDED RELEASE 24 HR
30.0000 mg | ORAL_TABLET | Freq: Every day | ORAL | Status: DC
Start: 2011-10-22 — End: 2011-10-22
  Administered 2011-10-22: 30 mg via ORAL
  Filled 2011-10-22: qty 1

## 2011-10-22 MED ORDER — ONDANSETRON HCL (PF) 4 MG/2 ML INJECTION SOLUTION
4.0000 mg | Freq: Four times a day (QID) | INTRAMUSCULAR | Status: DC | PRN
Start: 2011-10-22 — End: 2011-10-22
  Administered 2011-10-22: 4 mg via INTRAVENOUS
  Filled 2011-10-22: qty 2

## 2011-10-22 MED ORDER — ONDANSETRON HCL (PF) 4 MG/2 ML INJECTION SOLUTION
INTRAMUSCULAR | Status: AC
Start: 2011-10-22 — End: 2011-10-22
  Administered 2011-10-22: 4 mg via INTRAVENOUS
  Filled 2011-10-22: qty 2

## 2011-10-22 MED ORDER — ACETAMINOPHEN 325 MG TABLET
650.0000 mg | ORAL_TABLET | ORAL | Status: DC | PRN
Start: 2011-10-22 — End: 2011-10-22
  Administered 2011-10-22 (×2): 650 mg via ORAL
  Filled 2011-10-22 (×2): qty 2

## 2011-10-22 MED ORDER — MORPHINE 2 MG/ML INJECTION WRAPPER
2.0000 mg | INJECTION | Freq: Once | INTRAMUSCULAR | Status: AC
Start: 2011-10-22 — End: 2011-10-22
  Administered 2011-10-22: 2 mg via INTRAVENOUS
  Filled 2011-10-22: qty 1

## 2011-10-22 MED ORDER — ISOSORBIDE MONONITRATE ER 30 MG TABLET,EXTENDED RELEASE 24 HR
30.0000 mg | ORAL_TABLET | Freq: Every day | ORAL | Status: DC
Start: 2011-10-22 — End: 2012-07-06

## 2011-10-22 MED ORDER — METOPROLOL TARTRATE 25 MG TABLET
25.00 mg | ORAL_TABLET | Freq: Two times a day (BID) | ORAL | Status: DC
Start: 2011-10-22 — End: 2011-10-22
  Administered 2011-10-22: 25 mg via ORAL
  Filled 2011-10-22 (×2): qty 1

## 2011-10-22 MED ORDER — GABAPENTIN 100 MG CAPSULE
400.00 mg | ORAL_CAPSULE | Freq: Every evening | ORAL | Status: DC
Start: 2011-10-22 — End: 2011-10-22
  Filled 2011-10-22: qty 1

## 2011-10-22 MED ORDER — ASPIRIN 325 MG TABLET
325.0000 mg | ORAL_TABLET | Freq: Every day | ORAL | Status: DC
Start: 2011-10-22 — End: 2011-10-22
  Administered 2011-10-22: 325 mg via ORAL
  Filled 2011-10-22: qty 1

## 2011-10-22 MED ORDER — DOXEPIN 10 MG CAPSULE
10.0000 mg | ORAL_CAPSULE | Freq: Every day | ORAL | Status: DC
Start: 2011-10-22 — End: 2011-10-22
  Administered 2011-10-22: 10 mg via ORAL
  Filled 2011-10-22: qty 1

## 2011-10-22 MED ORDER — CYCLOBENZAPRINE 10 MG TABLET
10.0000 mg | ORAL_TABLET | Freq: Three times a day (TID) | ORAL | Status: DC | PRN
Start: 2011-10-22 — End: 2011-10-22
  Administered 2011-10-22: 10 mg via ORAL
  Filled 2011-10-22 (×2): qty 1

## 2011-10-22 MED ORDER — ROSUVASTATIN 10 MG TABLET
20.00 mg | ORAL_TABLET | Freq: Every evening | ORAL | Status: DC
Start: 2011-10-22 — End: 2011-10-22
  Administered 2011-10-22: 20 mg via ORAL
  Filled 2011-10-22 (×2): qty 2

## 2011-10-22 MED ORDER — MORPHINE 4 MG/ML INJECTION SYRINGE
INJECTION | INTRAMUSCULAR | Status: AC
Start: 2011-10-22 — End: 2011-10-22
  Administered 2011-10-22: 4 mg via INTRAVENOUS
  Filled 2011-10-22: qty 1

## 2011-10-22 MED ORDER — ONDANSETRON HCL (PF) 4 MG/2 ML INJECTION SOLUTION
4.0000 mg | Freq: Once | INTRAMUSCULAR | Status: AC
Start: 2011-10-22 — End: 2011-10-22

## 2011-10-22 MED ORDER — MORPHINE 4 MG/ML INJECTION SYRINGE
4.0000 mg | INJECTION | Freq: Once | INTRAMUSCULAR | Status: AC
Start: 2011-10-22 — End: 2011-10-22

## 2011-10-22 MED ORDER — PROMETHAZINE 25 MG/ML INJECTION SOLUTION
25.0000 mg | Freq: Four times a day (QID) | INTRAMUSCULAR | Status: DC | PRN
Start: 2011-10-22 — End: 2011-10-22
  Filled 2011-10-22 (×2): qty 1

## 2011-10-22 NOTE — H&P (Addendum)
[facility]   General Medicine  Admission H&P    Date of Service:  10/22/2011  Florer,Terry Ethelene Browns, 45 y.o. male  Date of Admission:  10/21/2011  Date of Birth:  [birth year]  PCP: [name], DO    Information Obtained from: patient  Chief Complaint:  Chest Pain     HPI: Spencer [name] is a 45 y.o., Black/African American male with history of HLD and borderline diabetes who presents with chest pain that has been worsening over the past 2 weeks. He reports constant chest pain rated as a 2-3 that has been ongoing for the past 3 years. Over the past 2 weeks his chest pain has worsened and is rated as a 7-8/10. Worsened chest pain is not associated with activity. It is located in his left chest described as a sharp shooting pain which sometimes radiates to his neck.  chest pain yesterday was associated with diaphoresis. Pain is reported to be associated with a sharp shooting pain in his finger tips.  Increased pain lasts about 30 minutes and returns to baseline. He follows with Dr. Myer Peer and was told last week that if his pain persists or gets any worse he should come to the ED for evaluation.  Chest pain worsened again last night and lasted about 2 hours until relieved with nitro in the ED. He states that pain is the same pain he had in 2009 when he had his catheterization.  No history of MI. Mild CAD.  Patient denies any nausea or vomiting.      Last ECHO - None   Last Cath (1/09) - Essentially normal coronary angiogram with normal ventricular systolic function.    PAST MEDICAL:    Past Medical History   Diagnosis Date   . Colitis    . 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 Diabetes mellitus      borderline   . Headache    . Reflux    . Chest pain 12/23/2007   . Borderline diabetes mellitus 12/23/2007   . Spells 06/16/2008   . Stress test 05/28/2010   . S/P cardiac catheterization 05/28/2010   . PAD (peripheral artery disease) 06/03/2010   . Hypertriglyceridemia 06/03/2010    . Anginal pain 06/03/2010   . CAD (coronary artery disease) 06/03/2010   . Crohn's disease 06/14/2010   . IBS (irritable bowel syndrome) 06/14/2010   . GERD (gastroesophageal reflux disease) 06/14/2010   . Hyperlipidemia 12/26/2007     denies cp sob or mi hx   . Post traumatic stress disorder (PTSD)      currently being evaluated   . Type 2 diabetes mellitus      borderline    Past Surgical History   Procedure Date   . Pb colonoscopy,biopsy    . Hx laparotomy    . Hx heart catheterization    . Colonoscopy 09/24/2010     COLONOSCOPY performed by Spencer [name] at [facility] OR ENDO        Medications Prior to Admission     Medication    SIMVASTATIN ORAL    take  by mouth.      DOXEPIN HCL (DOXEPIN ORAL)    take  by mouth.      metoprolol (LOPRESSOR) 25 mg Oral Tablet    take 1 Tab by mouth Twice daily.    nitroglycerin (NITROSTAT) 0.4 mg Sublingual Tablet, Sublingual    for 3 doses over 15 minutes  rosuvastatin (CRESTOR) 20 mg Oral Tablet    take 1 Tab by mouth QPM.    Gabapentin (NEURONTIN) 400 mg Oral Capsule    take 1 Cap by mouth every night.    aspirin 325 mg Tab    take 325 mg by mouth Once a day.        Allergies   Allergen Reactions   . Adhesive      Red rash       Vaccinations:      Pneumovax: Does not meet criteria to receive.    Influenza: Patient declines vaccination    Family History  Family History   Problem Relation Age of Onset   . Hypertension Mother    . Hypertension Brother    . Diabetes Brother    . Hypertension Brother        Social History  History   Substance Use Topics   . Smoking status: Never Smoker    . Smokeless tobacco: Not on file   . Alcohol Use: Yes      rarely        ROS:   Constitutional: negative for fevers and chills  Eyes: negative for irritation and redness  Ears, nose, mouth, throat, and face: negative for sore mouth and sore throat  Respiratory: negative for cough or sputum  Cardiovascular: positive for chest pain and dyspnea  Gastrointestinal: negative for nausea and vomiting   Genitourinary:negative for frequency and dysuria  Integument/breast: negative for rash and pruritus  Hematologic/lymphatic: negative for easy bruising and bleeding  Musculoskeletal:negative for myalgias and arthralgias  Neurological: negative for headaches and dizziness  Behavioral/Psych: negative for anxiety and depression    Examination:  Temperature: 36.6 C (97.9 F) Heart Rate: 82  BP (Non-Invasive): 118/77 mmHg   Respiratory Rate: 18  SpO2-1: 96 % Pain Score: 8   General: appears in good health and mild distress  Eyes: Conjunctiva clear.  HENT:Head atraumatic and normocephalic, Mouth mucous membranes moist.   Neck: No JVD and no adenopathy  Lungs: Clear to auscultation bilaterally.   Cardiovascular: regular rate and rhythm  no murmurs  Abdomen: Bowel sounds normal, non-distended, mild TTP in LLQ   Extremities: Trace pitting edema bilateral lower extremities  Skin: Skin warm and dry  Neurologic: Alert and oriented x3    Labs:    Lab Results for Last 24 Hours:    Results for orders placed during the hospital encounter of 10/21/11 (from the past 24 hour(s))   CBC/DIFF       Component Value Range    WBC 7.5  3.5 - 11.0 THOU/uL    RBC 4.86  4.46 - 5.70 MIL/uL    HGB 15.1  13.1 - 17.3 g/dL    HCT 91.4  78.2 - 95.6 %    MCV 85.1  82.0 - 99.0 fL    MCH 31.0  27.4 - 33.0 pg    MCHC 36.4 (*) 31.6 - 35.5 g/dL    RDW 21.3  08.6 - 57.8 %    PLATELET COUNT 230  140 - 450 THOU/uL    MPV 7.5  7.4 - 10.4 FL    PMN'S 23 (*) 40 - 75 %    PMN ABS 1.725  1.5 - 7.7 THOU/uL    LYMPHOCYTES 69 (*) 20 - 45 %    LYMPHS ABS 5.175 (*) 1.0 - 4.8 THOU/uL    MONOCYTES 6  4 - 13 %    MONOS ABS 0.450  0.1 - 0.9 THOU/uL  EOSINOPHIL 2  1 - 6 %    EOS ABS 0.150  0.1 - 0.3 THOU/uL    BASOPHILS 0  0 - 1 %    BASOS ABS 0.000  0.0 - 0.2 THOU/uL    RBC MORPHOLOGY        Value: POLYCHROMASIA-1+      HYPOCHROMASIA-1+      OVALOCYTES-1+    WBC MORPHOLOGY ATYPICAL LYMPHOCYTES-1+     BASIC METABOLIC PANEL, NON-FASTING       Component Value Range     SODIUM 137  136 - 145 mmol/L    POTASSIUM 3.6  3.5 - 5.1 mmol/L    CHLORIDE 103  96 - 111 mmol/L    CARBON DIOXIDE 27  23 - 33 mmol/L    ANION GAP 7  5 - 16 mmol/L    CREATININE 1.02  0.62 - 1.27 mg/dL    ESTIMATED GLOMERULAR FILTRATION RATE >59  >59 ml/min/1.3m2    GLUCOSE,NONFAST 119  65 - 139 mg/dL    BUN 9  8 - 26 mg/dL    BUN/CREAT RATIO 9  6 - 22    CALCIUM 9.2  8.5 - 10.4 mg/dL   PT/INR       Component Value Range    PROTHROMBIN TIME 10.5  9.1 - 11.5 Sec    INR 1.0  0.8 - 1.2   PTT (PARTIAL THROMBOPLASTIN TIME)       Component Value Range    APTT 25.6  22.0 - 32.0 Sec   TROPONIN-I (FOR ED ONLY)       Component Value Range    TROPONIN-I <0.010  <0.030 ng/mL       Imaging Studies:      Chest Xray AP (10/21/11) - Shallow inspiration. No acute cardiopulmonary process.    ECG (10/21/11) - NSR with no signs of ischemia/infarction    Last Cath (1/09)   FINDINGS:    HEMODYNAMICS: Left ventricular end-diastolic pressure was approximately 18-20 mmHg. There was no gradient upon pullback across the aortic valve.    LEFT VENTRICULAR FUNCTION STUDY: The left ventricular angiogram displayed an ejection fraction of approximately 60% with no wall motion abnormalities and no mitral regurgitation noted.    CORONARY ANGIOGRAPHY    LEFT MAIN CORONARY ARTERY: Was normal.    LEFT ANTERIOR DESCENDING CORONARY ARTERY: Had a proximal to mid focal area of 10% stenosis, then it became a tortuous vessel in the mid portion with myocardial bridging noted in the mid-to-distal LAD. Diagonal #1 was small and normal. Diagonal #2 was normal.    LEFT CIRCUMFLEX CORONARY ARTERY: Bifurcates and there was a large obtuse marginal branch #1 which was normal. There was a small obtuse marginal branch which was normal.    RIGHT CORONARY ARTERY: A large, dominant tortuous vessel and was normal.    IMPRESSION:  1. Essentially normal coronary angiogram as above.  2. Normal ventricular systolic function.    RECOMMENDATIONS:   1. Continue aggressive risk factor modification.  2. Noncardiac chest pain.  3. False positive myocardial perfusion scan.  4. Follow up with his primary care physician, Dr. Janeece Fitting in [place], [place], in 1-2 weeks.    DNR Status:  Prior    Assessment/Plan:   Active Hospital Problems   Diagnoses   . Primary Problem: Chest pain   . GERD (gastroesophageal reflux disease)   . PAD (peripheral artery disease)   . CAD (coronary artery disease)   . Hyperlipidemia   . Borderline diabetes mellitus  Aarnav Steagall is a 45 y.o., Black/African American male with history of HLD and borderline diabetes who presents with worsening chest pain that has been ongoing over the past 2 week.     Typical Chest Pain   - Sharp pain in left chest not associated with activity and relieved with nitro in ED.   - No history of MI.   - Initial Troponin in ED Negative.  - ECG NSR   - Chest Xray with no acute process.   - Control pain with sublingual nitro as BP tolerates.   - Rule out ACS with serial cardiac enzymes   - NPO for possible MPS in am     CAD   - Mild with 10 % stenosis in LAD. Cath 09 with no intervention.   - Continue Aspirin   - Risk factor management     HLD   - Continue Rosuvastatin     HTN   - Continue metoprolol 25 mg BID     DVT/PE Prophylaxis: Heparin    Beryle Lathe, MD 10/22/2011, 1:55 AM        Late entry for 10/22/11. I saw and examined the patient.  I reviewed the resident's note.  I agree with the findings and plan of care as documented in the resident's note.  Any exceptions/additions are edited/noted.    Charisse Klinefelter, MD 10/23/2011, 1:01 PM

## 2011-10-22 NOTE — Nurses Notes (Signed)
Service notified of pt's c/o CP.  Pt started given Tylenol at 0750 and started on Imdur at 0905.  Pt reported CP relieved to 1/10 until he got up to the bathroom, once he got back to bed his pain returned.  States pain is sharp and pulsating, radiating from his left axillary to mid chest.  Service ordered to give another 650mg  Tylenol.

## 2011-10-22 NOTE — Care Plan (Signed)
Problem: General Plan of Care(Adult,OB)  Goal: Plan of Care Review(Adult,OB)  The patient and/or their representative will communicate an understanding of their plan of care   Outcome: Adequate for Discharge Date Met:  10/22/11  Cardiac enzymes negative.  Discharge orders received.  IV and tele monitor removed.  Discharge reviewed with patient.

## 2011-10-22 NOTE — ED Resident Handoff Note (Signed)
Code Status: Full    Allergies   Allergen Reactions   . Adhesive      Red rash       Filed Vitals:    10/21/11 2327 10/21/11 2331 10/21/11 2353 10/22/11 0031   BP: 132/86 123/81 127/84 118/77   Pulse: 87 93 86 82   Temp:       Resp: 20 20 18 18    SpO2: 96% 94% 95% 96%         HPI:  Spencer [name] is a 45 y.o. male presents with CP. Left sided stabbing pain that radiates to left neck. +SOB, diaphoresis. +tingling in right fingers during episodes. +dyspnea on exertion worsening over past 2 days. Has baseline chest pressure with this episode different in character.   PMH: Chron's, IFG  No tobacco.    Pertinent Exam Findings:  Unremarkable.    Pertinent Imaging/Lab results:  Labs Reviewed   CBC/DIFF - Abnormal; Notable for the following:     MCHC 36.4 (*)      PMN'S 23 (*)      LYMPHOCYTES 69 (*)      LYMPHS ABS 5.175 (*)      All other components within normal limits   BASIC METABOLIC PANEL, NON-FASTING   PT/INR   PTT (PARTIAL THROMBOPLASTIN TIME)   TROPONIN-I (FOR ED ONLY)   TROPONIN-I   CREATINE KINASE (CK), TOTAL   CREATINE KINASE (CK) MB ISOENZYME     CXR - unremarkable.  ECG - unremarkable.    Pending Studies:  None    Consults:  Medicine    Plan:  Admitted to medicine for CP r/o.  After a thorough discussion of the patient including presentation, ED course, and review of above information I have assumed care of Spencer [name] from Dr. Charmian Muff at 3:22 AM      Georganna Skeans, MD 10/22/2011, 3:22 AM    Course:  Admitted to medicine for CP r/o.

## 2011-10-22 NOTE — Nurses Notes (Signed)
Male patient admit from emergency room with complaints of chest pain for two weeks with increased shortness of breath.  Chest pain located to left side of chest that radiates down left arm that is a tingling sensation.  Patient rates pain 7/10, as per telemetry sinus rhythm 60's, 18 gauge to left AC.  Patient oriented to room and bed controls is encouraged to call if needing assistance also made aware of diet order that is NPO.

## 2011-10-22 NOTE — Discharge Summary (Addendum)
 DISCHARGE SUMMARY      PATIENT NAME:  Spencer [name], Spencer [name]  MRN:  [id number]  DOB:  06-Aug-1966    ADMISSION DATE:  10/21/2011  DISCHARGE DATE:  10/22/2011    ATTENDING PHYSICIAN: Cindia Daub Laks*  PRIMARY CARE PHYSICIAN: Spencer FORBES Hoh, DO     ADMISSION DIAGNOSIS: Chest pain  DISCHARGE DIAGNOSIS:   No resolved problems to display.    Active Hospital Problems   Diagnoses Date Noted    . Principle Problem: Chest pain 12/23/2007    . GERD (gastroesophageal reflux disease) 06/14/2010    . PAD (peripheral artery disease) 06/03/2010    . CAD (coronary artery disease) 06/03/2010    . Hyperlipidemia 12/26/2007    . Borderline diabetes mellitus 12/23/2007       Resolved Hospital Problems   Diagnoses     Active Non-Hospital Problems   Diagnoses Date Noted   . BPH (benign prostatic hyperplasia) 07/15/2011   . Abdominal pain 07/15/2011   . Nausea and vomiting 07/15/2011   . Microhematuria 12/25/2010   . Crohn's disease 06/14/2010   . IBS (irritable bowel syndrome) 06/14/2010   . Hypertriglyceridemia 06/03/2010   . Anginal pain 06/03/2010   . Stress test 05/28/2010   . S/P cardiac catheterization 05/28/2010   . Spells 06/16/2008      DISCHARGE MEDICATIONS:  Current Discharge Medication List      CONTINUE these medications which have NOT CHANGED    Details   DOXEPIN  HCL (DOXEPIN  ORAL) take  by mouth.        metoprolol  (LOPRESSOR ) 25 mg Oral Tablet take 1 Tab by mouth Twice daily.  Qty: 60 Tab, Refills: 5      nitroglycerin  (NITROSTAT ) 0.4 mg Sublingual Tablet, Sublingual for 3 doses over 15 minutes  Qty: 25 Tab, Refills: 3      rosuvastatin  (CRESTOR ) 20 mg Oral Tablet take 1 Tab by mouth QPM.  Qty: 30 Tab, Refills: 5      Gabapentin  (NEURONTIN ) 400 mg Oral Capsule take 1 Cap by mouth every night.  Qty: 30 Cap, Refills: 0      aspirin  325 mg Tab take 325 mg by mouth Once a day.         STOP taking these medications       SIMVASTATIN  ORAL Comments:   Reason for Stopping:             DISCHARGE INSTRUCTIONS:   No  discharge procedures on file.  REASON FOR HOSPITALIZATION AND HOSPITAL COURSE:  Spencer [name] is a 45 y.o., Black/African American male with history of HLD and borderline diabetes who presented with chest pain that has been worsening over the past 2 weeks. He reported constant chest pain rated as a 2-3 that had been ongoing for the past 3 years. Over the past 2 weeks his chest pain had worsened and is rated as a 7-8/10. Worsened chest pain is not associated with activity. It is located in his left chest described as a sharp shooting pain which sometimes radiates to his neck. Chest pain yesterday was associated with diaphoresis. Pain is reported to be associated with a sharp shooting pain in his finger tips. Increased pain lasts about 30 minutes and returns to baseline. He follows with Dr. Martell and was told last week that if his pain persists or gets any worse he should come to the ED for evaluation. Chest pain worsened again last night and lasted about 2 hours until relieved with nitro in the ED. He states that  pain is the same pain he had in 2009 when he had his catheterization. No history of MI. Mild CAD. Patient denies any nausea or vomiting. He was admitted and serial CE were drawn. Troponin was negative x3. He was also given Imdur  before admission, which he stated helped much more than his nitroglycerin  and did not give him a headache. He will follow up with Dr. Leocadia in 1 week and get an MPS scheduled as well in 1 week. They will call with the appointment time. He will also go home with Imdur  prescription as well, with instructions not to use nitroglycerin  in addition unless his chest pain escalates, in which case he should contact his physician as well. We hope [name] continues his speedy recovery.       CONDITION ON DISCHARGE:  A. Ambulation: Full ambulation  B. Self-care Ability: Complete  C. Cognitive Status Alert and Oriented x 3    DISCHARGE DISPOSITION:  Home discharge     cc: Primary  Care Physician:  Spencer FORBES Hoh, DO  [address]  [facility] [place] [address]     [id number] Physician:  No referring provider defined for this encounter.     Debby Gilmore Garfinkel, DO    Referring providers can utilize [email or web address] to access their referred [employer] patient's information.      Rosea Reece Fail, MD 10/23/2011, 1:01 PM

## 2011-10-22 NOTE — Nurses Notes (Signed)
 Patient c/o pain to chest rates pain 7/10, refuses any nitro due to BP being 109/71 HR 56 medicated with one time order of Morphine  2 mg IVP given by Ferdie Ellen RN, also c/o nausea medicated with Zofran  4 mg IVP also given by Ferdie Ellen RN.

## 2011-10-22 NOTE — ED Nurses Note (Signed)
Pt medicated per MAR. Awaiting admission consult

## 2011-10-22 NOTE — ED Attending Handoff Note (Signed)
Patient checked out to me by prior attending. The plan for evaluation, management, and disposition for the patient was unchanged from that of the prior attending. Exceptions are outlined subsequently.    Chest pain for several weeks. Medicine admit.

## 2011-10-24 NOTE — Care Management Notes (Signed)
Care Coordinator/Social Work Plan  [place]  Patient Name: Spencer [name]   MRN: [id number]   Acct Number: [id number]  DOB: June 09, 1966 Age: 45  **Admission Information**  Patient Type: INPATIENT  Admit Date: 10/21/2011 Admit Time: 22:51  Admit Reason: chest pain  Admitting Phys: Ricarda Frame [facility]  Attending Phys: Ricarda Frame [facility]  Unit: 8E Bed: 878-A  Discharge Information  Actual D/C Date: 10/22/2011  Actual LOS: 1  Discharge Disposition: HOME/SELF CARE (Code 01)  160. LOC Notification, CMS Important Message / Detailed Notice  Created by : Herma Mering Date/Time 2011-10-24 09:56:[id number]  Medical Necessity and Level Of Care Notification  Level of Care Notification Patient notified of Level of Care status change to Observation (Date / Time) Patient  discharged before determination made.

## 2011-10-24 NOTE — Care Management Notes (Signed)
Utilization Review Determination    SECTION I   10/24/2011 (date)    Current [facility]LIN MD Order: inpatient  Current [facility] ADT Order: inpatient  Utilization Review Findings: OBS  Additional information available for medical team: No    Utilization Review MD: Asencion Noble   Based on clinical information available to me as per above and in chart, the patient's status should be: Observation    Utilization Review RN: Herma Mering, RN                               Date/Time: 10/24/2011 0945  -------------------------------------------------------------------------------------------------------------------  1.  I have reviewed this case with the patient's treating physician Dr. Jacklynn Bue, and the MD agrees with this change in status.  They are aware of the referral to the Utilization Review Committee.      4.  MERLIN order changed: No patient discharged prior to determination       OBS Database: No       UDR Doc in Allscripts: No patient discharged prior to determination        Utilization Review RN: Herma Mering, RN                                  Date/Time: 10/24/2011 0945  (Patient notification is required only if the status is changed while an Inpatient to Observation Status)  -------------------------------------------------------------------------------------------------------------------

## 2011-10-31 ENCOUNTER — Ambulatory Visit (HOSPITAL_BASED_OUTPATIENT_CLINIC_OR_DEPARTMENT_OTHER)

## 2011-10-31 ENCOUNTER — Ambulatory Visit (HOSPITAL_BASED_OUTPATIENT_CLINIC_OR_DEPARTMENT_OTHER): Admission: RE | Admit: 2011-10-31 | Source: Ambulatory Visit

## 2011-11-02 ENCOUNTER — Encounter (INDEPENDENT_AMBULATORY_CARE_PROVIDER_SITE_OTHER): Payer: Self-pay | Admitting: Internal Medicine

## 2011-11-09 ENCOUNTER — Ambulatory Visit (INDEPENDENT_AMBULATORY_CARE_PROVIDER_SITE_OTHER): Admitting: Internal Medicine

## 2011-11-09 ENCOUNTER — Encounter (INDEPENDENT_AMBULATORY_CARE_PROVIDER_SITE_OTHER): Payer: Self-pay

## 2011-11-09 VITALS — BP 120/76 | HR 83 | Resp 20 | Ht 70.0 in | Wt 215.0 lb

## 2011-11-09 DIAGNOSIS — Q245 Malformation of coronary vessels: Secondary | ICD-10-CM

## 2011-11-09 HISTORY — DX: Malformation of coronary vessels: Q24.5

## 2011-11-09 NOTE — Progress Notes (Addendum)
Name: Spencer [name]                       Date of Birth: 1966/04/28   MRN:  [id number]                         Date of visit: 11/09/2011     PCP: [name], DO     Subjective  Spencer [name] is a 45 y.o. year old male who presents for Follow-up to clinic.  The patient was admitted to [facility] the week of Thanksgiving with chest pain.  He was ruled out with enzymes and EKG.  He was discharged on Imdur and scheduled for an outpatient stress test.  He no-showed the stress test and did not start the Imdur.  Admits to only taking his lopressor once daily.  Has a h/o myocardial bridging per cath in 2009.  He claims he ran out of crestor and is out of his metoprolol as well.  He has sharp chest pain and soreness in his chest with palpation that is constant since before his hospital admission.  He has associated arm numbness, headaches, and occasional shortness of breath with the pain.  He denies palpitations, dizziness, syncope.  Patient Active Problem List   Diagnoses Date Noted   . 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 BPH (benign prostatic hyperplasia) 07/15/2011   . Abdominal pain 07/15/2011   . Nausea and vomiting 07/15/2011   . Microhematuria 12/25/2010   . Crohn's disease 06/14/2010   . IBS (irritable bowel syndrome) 06/14/2010   . GERD (gastroesophageal reflux disease) 06/14/2010   . PAD (peripheral artery disease) 06/03/2010   . Hypertriglyceridemia 06/03/2010   . Anginal pain 06/03/2010   . CAD (coronary artery disease) 06/03/2010   . Stress test 05/28/2010     10/09/09--@ [facility]-EF 69%.  Normal exercise stress and MPS.   12/24/07--@ [place]-EF 65%.  Limited but probably normal exam due to patient motion.    . S/P cardiac catheterization 05/28/2010     12/25/07--@ [place] revealed mild CAD with normal LVF. LAD w/prox to mid focal area of 10% stenosis, then it became a tortuous vessel in the mid portion with myocardial bridging noted in the mid-to-distal LAD.   Marland Kitchen Spells 06/16/2008      LOC in daytime and other spells of 10 sec shaking episodes in sleep   . Hyperlipidemia 12/26/2007   . Chest pain 12/23/2007     01/07/08--24hr Holter monitor @ [facility] was uneventful.    . Borderline diabetes mellitus 12/23/2007      Current Outpatient Prescriptions   Medication Sig   . isosorbide mononitrate (IMDUR) 30 mg Oral Tablet Sustained Release 24 hr take 1 Tab by mouth Once a day.   Marland Kitchen DOXEPIN HCL (DOXEPIN ORAL) take  by mouth.     . metoprolol (LOPRESSOR) 25 mg Oral Tablet take 1 Tab by mouth Twice daily.   . nitroglycerin (NITROSTAT) 0.4 mg Sublingual Tablet, Sublingual for 3 doses over 15 minutes   . rosuvastatin (CRESTOR) 20 mg Oral Tablet take 1 Tab by mouth QPM.   . Gabapentin (NEURONTIN) 400 mg Oral Capsule take 1 Cap by mouth every night.   Marland Kitchen aspirin 325 mg Tab take 325 mg by mouth Once a day.   REVIEW OF SYSTEMS:   General: no fever, chills, or weight changes.  HEENT: no vision changes.  Cardiac: Positive for chest pain, no palpitations, dizziness, light-headedness, or  near syncope.  Resp: No dyspnea at rest, no dyspnea on exertion; no cough or hemoptysis; no orthopnea or PND.  GI: No N/V. No melena or bright red blood per rectum.  Ext: No edema.  Neuro: No focal weakness or numbness.  Objective  BP 120/76   Pulse 83   Resp 20   Ht 1.778 m (5\' 10" )   Wt 97.523 kg (215 lb)   BMI 30.85 kg/m2   SpO2 98%  General: no acute distress and alert  Eyes: Sclera non-icteric.   HENT:Mouth mucous membranes moist.   Neck: No JVD, no carotid bruit.  Lungs: clear to auscultation bilaterally.   Cardiovascular:    Heart regular rate and rhythm, S1, S2 normal, no murmur, click, rub or gallop and    Chest wall tenderness to palpation noted that reproduces patient's chest pain.  No masses, deformities, or rashes noted.  Abdomen: soft, non-tender, non-distended and no hepatosplenomegaly  Extremities: extremities normal, atraumatic, no cyanosis or edema  Skin: Skin warm and dry and No rashes  Assessment/Plan  1. Chest pain     2. Hyperlipidemia    3. CAD (coronary artery disease)    4. Myocardial Bridging    Atypical, reproducible chest pain.  Patient was ruled out by enzymes and EKG on recent admission.  No-Showed outpatient stress test.  Will re-order for him and call with results.   Myocardial Bridging and mild CAD by cath in 2009.  Stressed the importance of medication compliance and the need for twice daily metoprolol.  Refills given for Metoprolol.  Encouraged patient to start Imdur.  The patient should continue on statin therapy.  His LDL goal is less than 100, ideally less than 70, and HDL goal is greater than 40.  Refills given for Crestor.  Patient will follow up for regularly scheduled appointment.     This patient was jointly seen and examined by Dr. Patricie Geeslin Ao, who agrees with the plan of care.    Royden Purl Jamison Neighbor, MS, PA-C  Physician Assistant-Certified  [facility]    I have seen and evaluated this patient jointly with Othelia Pulling and agree with her assessment, recommendations and plan. Any changes are noted below.     Dineen Kid Tingler, MD  Assistant Professor of Cardiology   [facility]

## 2011-11-11 ENCOUNTER — Ambulatory Visit: Attending: GASTROENTEROLOGY

## 2011-11-11 ENCOUNTER — Encounter (INDEPENDENT_AMBULATORY_CARE_PROVIDER_SITE_OTHER): Payer: Self-pay

## 2011-11-11 DIAGNOSIS — R112 Nausea with vomiting, unspecified: Secondary | ICD-10-CM

## 2011-11-11 DIAGNOSIS — K219 Gastro-esophageal reflux disease without esophagitis: Secondary | ICD-10-CM

## 2011-11-11 DIAGNOSIS — K589 Irritable bowel syndrome without diarrhea: Secondary | ICD-10-CM

## 2011-11-11 DIAGNOSIS — R109 Unspecified abdominal pain: Secondary | ICD-10-CM | POA: Insufficient documentation

## 2011-11-11 MED ORDER — HYOSCYAMINE SULFATE 0.125 MG TABLET
0.13 mg | ORAL_TABLET | Freq: Four times a day (QID) | ORAL | Status: DC | PRN
Start: 2011-11-11 — End: 2014-07-08

## 2011-11-11 NOTE — Progress Notes (Addendum)
PATIENT NAME: Spencer [name]  DOB: 08/11/1966  MRN: [id number]  DATE OF SERVICE: 11/11/2011    Reason For Visit: abdominal pain  SUBJECTIVE:  Spencer [name] is a 45 y.o. male comes in today to follow up on  medical problems such as abdominal pain. H/o CAD s/p MI, HLD, GERD, PAD, diet controlled DM, BPH, PTSD, s/p laparoscopy for his abdominal pain in 2005.  Last clinic visit on 07/15/2011.  He had SBFT done, RUQ U/S and Abdominal duplex in 06/2011.  Both were normal.  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    He continues to have some L/MQ on left abdomen, 2-3/10 scale, sharp/cramping nature, worsen 1-2 per every few months.  BM can relieve his pain.  He usually has 1-2 BMs per day.  Regular, solid most.  But can be watery sometimes.    He does mention that there is a lot of stress at his work.  No heartburn, reflux now.  His weight is stable.  No n/v/f/c.  No NSAIDs use except ASA.  Had colonoscopy on 09/24/2010, which was endoscopic normal and biopsy benign.    OBJECTIVE:   BP 123/85  Pulse 69  Temp(Src) 36.3 C (97.4 F) (Tympanic)  Ht 1.778 m (5\' 10" )  Wt 95.255 kg (210 lb)  BMI 30.13 kg/m2  SpO2 96%Body mass index is 30.13 kg/(m^2).  Gen: NAD, AAOX3  HEENT: PERLA, moist mucosa, no erythema in mucosa, no scleral icterus  Lung: CTAB, no dullness to percussion.  Heart: No JVD. RRR, no murmur/gallop or rubs.   Abd: soft, ND, NT, no hepatosplenomegaly. No fluid wave noted.  Ext: no edema. No cyanosis.    Neuro: CN II- XII is intact. Sensation is intact. 5+ strength in all ext. No asterixis  Derm: no skin rash.  Lymph: no cervical, inguinal LAD noted.    Studies Reviewed :    Previous endoscopy and photos were carefully reviewed ; pathology reports ; X-Ray and Sono and Duplex , all reviewed.  Labs reviewed.    Lab Results   Component Value Date    WBC 7.5 10/21/2011    HGB 15.1 10/21/2011    HCT 41.4 10/21/2011    PLTCNT 230 10/21/2011    MCV 85.1  10/21/2011    CREATININE 1.02 10/21/2011    TOTBILIRUBIN 2.4* ( conjugated 0.2 ) 07/15/2011    ALBUMIN 4.0 07/15/2011    AST 18 07/15/2011    ALT 18 07/15/2011    ALKPHOS 47 04/01/2011    LIPASE 85* 07/15/2011    PROTHROMTME 10.5 10/21/2011    INR 1.0 10/21/2011    APTT 25.6 10/21/2011        ASSESSMENT:  Encounter Diagnoses   Name Primary?   . GERD (gastroesophageal reflux disease)    . Abdominal pain    . Nausea and vomiting    . IBS (irritable bowel syndrome)    . Unconjugated hyperbilirubinemia      45 yo male w/ H/o CAD s/p MI, HLD, GERD, PAD, diet controlled DM, BPH, PTSD, s/p laparoscopy for his abdominal pain in 2005, normal colonoscopy in 08/2010, comes in for f/u his abdominal pain  PLAN:   1. Abdominal pain:  - negative w/u with normal abdominal duplex, normal colonoscopy, SBFT, and RUQ u/s.  His abdominal pain likely due to IBS  - d/w pt for diet modification, including starting probiotics yogurt  - Levsin prn for his pain  2. GERD  - controlled  - continue  life style modification for reflux  3. 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    Pt will be f/u 6  Months prn.  This patient was seen and examined with Dr. Hubert Azure, who agrees with the above assessment and plan.    Alethia Melendrez Gwendolyn Grant, MD  Fellow, Section of Digestive Diseases  [facility]    GI STAFF :  =========  I saw and evaluated the patient. I confirmed the relevant history and findings. I reviewed the laboratory and radiographic data available. I read and edited the resident's note.  I agree with the findings and plan of care as documented in the resident's note.  Any exceptions/additions are edited/noted.    I met with him and his significant other.  I reviewed with them his negative w/u, including negative colonoscopy and negative ileal and colonic bx, without evidence for CD or any kind of colitis.  I discussed diet, lifestyle changes, attention to stressors,  probiotic yogurts, and prn  Levsin.  I went over a list of alarm signs with him , to return if he has any.   =======================================================================================================================  Margit Banda. Shamma'a  MD  Gastroenterology      I have already attached my attestation, signed the note, and closed the chart.  I do not know why it has been sent back to my In Box for signing again.  Margit Banda. Shamma'a  MD  Gastroenterology    CC:  [name], DO

## 2011-11-13 NOTE — Progress Notes (Signed)
I have seen and evaluated this patient jointly with Trina Langbehn and agree with her assessment, recommendations and plan. Any changes are noted below.

## 2011-11-16 ENCOUNTER — Ambulatory Visit (HOSPITAL_BASED_OUTPATIENT_CLINIC_OR_DEPARTMENT_OTHER): Admission: RE | Admit: 2011-11-16 | Source: Ambulatory Visit

## 2012-01-19 ENCOUNTER — Ambulatory Visit (INDEPENDENT_AMBULATORY_CARE_PROVIDER_SITE_OTHER): Admitting: Urology

## 2012-04-19 ENCOUNTER — Encounter (INDEPENDENT_AMBULATORY_CARE_PROVIDER_SITE_OTHER): Payer: Self-pay | Admitting: Internal Medicine

## 2012-04-25 ENCOUNTER — Ambulatory Visit (INDEPENDENT_AMBULATORY_CARE_PROVIDER_SITE_OTHER): Admitting: Internal Medicine

## 2012-04-25 ENCOUNTER — Encounter (INDEPENDENT_AMBULATORY_CARE_PROVIDER_SITE_OTHER): Payer: Self-pay | Admitting: Internal Medicine

## 2012-04-25 VITALS — BP 122/78 | HR 63 | Resp 20 | Ht 70.0 in | Wt 213.0 lb

## 2012-04-25 DIAGNOSIS — I2510 Atherosclerotic heart disease of native coronary artery without angina pectoris: Secondary | ICD-10-CM

## 2012-04-25 DIAGNOSIS — Q245 Malformation of coronary vessels: Secondary | ICD-10-CM

## 2012-04-25 DIAGNOSIS — I209 Angina pectoris, unspecified: Secondary | ICD-10-CM

## 2012-04-25 DIAGNOSIS — E781 Pure hyperglyceridemia: Secondary | ICD-10-CM

## 2012-04-25 DIAGNOSIS — E785 Hyperlipidemia, unspecified: Secondary | ICD-10-CM

## 2012-04-25 DIAGNOSIS — R7303 Prediabetes: Secondary | ICD-10-CM

## 2012-04-25 DIAGNOSIS — I739 Peripheral vascular disease, unspecified: Secondary | ICD-10-CM

## 2012-04-25 NOTE — Progress Notes (Signed)
Patient: Riccardo Holeman    DOB: 1966/07/06     [id number]  PCP: [name], DO     Date: 04/25/2012    S: Patient is a 46 y.o. year old male here for follow up. Doing well. Has occasional angina w/exercise. Only taking Imdur when needed, once or twice per week. Taking metoprolol BID. No recent labs. Joined [employer] and plans to use trainer with aerobic exercise and wants to lose >10 pounds. Did not keep appointment for stress test.    Son got accepted to [employer] to play basketball, but considering [employer].      Review of Systems: General: No fever/chills. No significant weight changes.  HEENT: no vision changes. Cardiac: Has chest pain. No palpitations. No dizziness. No light-headedness. No near syncope. Resp: No worsening dyspnea on exertion. No cough. No orthopnea. GI: No N/V. No melena. Ext: No edema. Neuro: No focal weakness or numbness.  Current Outpatient Prescriptions   Medication Sig   . Hyoscyamine Sulfate (ANASPAZ, LEVSIN) 0.125 mg Oral Tablet take 1 Tab by mouth Every 6 hours as needed. Please give sublingual form   . isosorbide mononitrate (IMDUR) 30 mg Oral Tablet Sustained Release 24 hr take 1 Tab by mouth Once a day.   Marland Kitchen DOXEPIN HCL (DOXEPIN ORAL) Take 25 mg by mouth Once per day as needed    . metoprolol (LOPRESSOR) 25 mg Oral Tablet take 1 Tab by mouth Twice daily.   . nitroglycerin (NITROSTAT) 0.4 mg Sublingual Tablet, Sublingual for 3 doses over 15 minutes   . rosuvastatin (CRESTOR) 20 mg Oral Tablet take 1 Tab by mouth QPM.   . Gabapentin (NEURONTIN) 400 mg Oral Capsule take 1 Cap by mouth every night.   Marland Kitchen aspirin 325 mg Tab take 325 mg by mouth Once a day.      Patient Active Problem List    Diagnosis Date Noted   . Unconjugated hyperbilirubinemia 11/11/2011   . 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 BPH (benign prostatic hyperplasia) 07/15/2011   . Abdominal pain 07/15/2011   . Nausea and vomiting 07/15/2011   . Microhematuria 12/25/2010    . Crohn's disease 06/14/2010   . IBS (irritable bowel syndrome) 06/14/2010   . GERD (gastroesophageal reflux disease) 06/14/2010   . PAD (peripheral artery disease) 06/03/2010   . Hypertriglyceridemia 06/03/2010   . Anginal pain 06/03/2010   . CAD (coronary artery disease) 06/03/2010   . Stress test 05/28/2010     10/09/09--@ [facility]-EF 69%.  Normal exercise stress and MPS.   12/24/07--@ [place]-EF 65%.  Limited but probably normal exam due to patient motion.    . S/P cardiac catheterization 05/28/2010     12/25/07--@ [place] revealed mild CAD with normal LVF. LAD w/prox to mid focal area of 10% stenosis, then it became a tortuous vessel in the mid portion with myocardial bridging noted in the mid-to-distal LAD.   Marland Kitchen Spells 06/16/2008     LOC in daytime and other spells of 10 sec shaking episodes in sleep   . Hyperlipidemia 12/26/2007   . Holter monitor 12/23/2007     01/07/08--24hr Holter monitor @ [facility] was uneventful.    . Borderline diabetes mellitus 12/23/2007     O:  Blood pressure 122/78, pulse 63, resp. rate 20, height 1.778 m (5\' 10" ), weight 96.616 kg (213 lb), SpO2 98.00%.  Gen: no acute distress. HEENT: PERRL. Moist mucous membranes. No JVD.  Cardiac: RRR. Normal S1, S2.   Resp: Clear to auscultation. No  rales.  Abd: s/nt/nd.  Ext: No edema. Skin: warm and dry.    A/P:  1. CAD (coronary artery disease)    2. Anginal pain    3. Borderline diabetes mellitus    4. Hyperlipidemia    5. Hypertriglyceridemia    6. Myocardial Bridging    7. PAD (peripheral artery disease)      1. Counseled on importance of taking meds as prescribed. Take Imdur every day.  2. LDL not at goal. Repeat labs.  3. Add on BMP and HgbA1c.  4. Encouraged patient to keep with plans to exercise and lose weight.   5. Cont HR control for hx of myocardial bridging.    Follow up 6 months.    Patient Instructions   Get fasting labs. Start taking Imdur every day.      Dineen Kid Iley Breeden, MD  Electronic signature on file

## 2012-04-25 NOTE — Patient Instructions (Signed)
Get fasting labs. Start taking Imdur every day.

## 2012-07-04 ENCOUNTER — Encounter (HOSPITAL_COMMUNITY): Payer: Self-pay

## 2012-07-05 NOTE — Ancillary Notes (Addendum)
 [facility] Record for: Spencer [name], Spencer [name]  Created: 06/29/2012 10:32:02 AM  MRN: [id number]  DOB: 1966/02/11  SSN: [id number]  Sex: Male  Height: 5 Feet 10 Inches - Weight: 212  Maiden Name:   Address:   [address]  [place]: [place], [place] [address]  E-Mail:   Day Phone: [phone]  Night Phone:  Other Phone: [phone]--wife's phone  Phone comments:   Call Back time:   Authorized contact: wife-Lakisha  Intake Date: 06/29/2012 10:32:02 AM  PCP: Lawerance DO, Richard   RefMD: Lawerance DO, Richard   Primary Insurance: BCBS PPO  Secondary Insurance:   Insurance Comments: BCBS ID  [id number]     -------Referral Assignment------  Where was the original referral directed?: Spine Center  Was a specific reviewer requested?: Unassigned Referral  How was the reviewer selected?:   Who requested the specific reviewer?:   How did you hear of our spine program?:   Is this a second opinion?:      -------Web Challenge Info----------  Favorite Color:   City of Birth:      ---------- 1st Review ----------     Review Date: 07/12/2012  Review Completed by: John Guinea-Bissau  Impression: totla spine pain  Disposition: Appointment/Treatment with Colleague, Other Treatment or Testing  Appt w/ Colleague: First Available Appointment  Colleague Name: Alm Quivers, MD  Appt How Soon:   Pre Treatment Type:   Pre Treatment Type Details:   Pre Treatment Other Test:   Appt Type:   Instructions: entire spine painlumbar xrays unremarkabledoes have inflammatory bowel disease so keep AS in mind, I don't see SI joint changes on xrayssee Dr Quivers to direct further imaging needs and treatment     ---------- Symptoms ----------     Chief complaint: entire spine pain---bilat leg pain--bilat groin painNo hip/buttock pain  Diagnosis from Other MD:      Symptoms: Pain,Numbness and/or tingling  Other Symptom Description:   Pain Location: Neck,Mid back,Upper back,Low back,Leg,Other  Other Pain Location Description: groin  Where is your pain the worst?: Low  back  Pain Type: Sharp/Stabbing,Constant,Dull/Aching  Pain Rating: 10  Does the pain radiate to other parts of your body? Yes   Radiate Where: From low back to left leg,From low back to right leg  Other Description:   Does it radiate to the fingers?   Does it radiate below the elbow?   Which specific part of your arm?   Which fingers?   Which part of your arm does pain go to?   How does it radiate to the arm?   Does it radiate to the toes?   Does it radiate below the knee? Yes  Which specific part of your leg? Left Ankle,Right Ankle  Which toes?   Which part of your leg does pain go to?   How does it radiate to the leg? Back  Additional pain information      Location of Numbness/tingling: Leg  Other Description:  Does numbness/tingling radiate to other parts of your body?:Yes  Where does the numbess/tingling radiate?:From low back to left leg,From low back to right leg  Other Description:  Does the numbness/tingling radiate below your elbow?:  Which specific part of your arm?:  Which fingers:  Which pat of your arm does the numbness/tingling go to?:  How does the numbness/tingling radiate to your arm?:  Does the numbness/tingling radiate below your knee?:Yes  Which specific part of your leg?:Toes  Which toes:All  Which part of your leg does the numbness/tingling  go to?:  How does the numbness/tingling radiate to your leg?:Inside,Back  Additional Numbness/tingling information:  Other Description:      Location of Weakness:   Other Description:   Additional Weakness Information:      The symptoms have been present for: more than 1 year  The symptoms began: 2005----getting worse  Was there a specific event that caused your symptoms?:   Additional Narrative Description: 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  Are you able to perform your daily activities with these symptoms?:   Since what date have you been unable to perform your daily routine?:   The symptoms improve when you: Never improve  Other activities  that improve your symptoms:   The symptoms worsen when you: Sit  Other activities that worsen your symptoms: sit or stand longer than 15 mins without moving     ---------- Work History ----------     Are you able to work?: Yes  Reasons for not working:   Other Reason for not working:   Do you have Work Restrictions: No  If applicable, maximum lifting restriction:   How long have you been unable to work:   Have you ever filed a W/C claim related to a neck or back injury?: no  Occupation:   Other Occupation Information: Oceanographer     ---------- Bowel/Bladder/Incontinence Issues ----------     Since the onset of symptoms, have you experienced any new problems urinating or having bowel movements?: No  Description:   Other Description:   How long have you had these bowel/bladder problems?:      ---------- Allergies ----------     Do you have any medication allergies? NKA  Allergies:   Other Details:   Allergic to Latex?: Yes  Allergic to intravenous contrast dyes?: NKA  Allergic to steroids?: NKA     ---------- Treatment/Testing ----------      Taking prescription medication for this problem?: No  Are you using any now?:   Have you received a Medrol  dose pack for this problem?: No  When did you last take the dosepack?:   Were your symptoms improved?:   Would you describe your relief as:   How long did you experience that amount of relief?:   Other Medrol  dosepack information:      --------------- PT ---------------     PT: No  When Received:   Where Received:   Other where received information:   Visits:   Types:   Other Types:   Improved:      If improved, describe level of relief:   If improved, how long did you experience relief:   Other Physical Therapy Treatment Information:      ---------- Chiropractic Services ----------     Chiro: Yes  When: 2005  Who: Dr. Panzio  Visits: 3 times this yrs  Types: Manipulation,Modalities - Ultrasound/ E-stim  Other Types:   Were Symptoms Improved: Yes  If  improved, describe level of relief: Small  If improved, how long did you experience relief: Short term  Other Chiropractic Treatment Information: he uses a tens unit     --------------- ESI ---------------     ESI: No  When:   Who:   Visits:   Types:   Improved:   If improved, describe level of relief:   If improved, how long did you experience relief:   Other Injection Treatment Information:      ---------- Diagnostic Tests ----------     Plain spine X-rays  On:07/30/2013MR  [id number] Where: [facility]  Area(s) Scanned: Thoracic,Lumbar  Do you have any of the following in case an MRI is ordered?: None  Other MRI factors:      ---------- Past Medical History ----------     What other doctors/providers have treated you for these spine issues? Vasicek DO, Richard Specialty: Primary Care Date: 2013    Ever diagnosed with Spine deformity?:   Prior neck or back surgery (1)?: No  When:   Who:   Area of neck/spine operated on:   Level:   Were symptoms improved?:   If improved, describe level of relief:  If improved, how long did you experience relief?:   Prior neck or back surgery (2)?:   When:   Who:   Area of neck/spine operated on:   Level:   Were symptoms improved?:   If improved, describe level of relief:  If improved, how long did you experience relief?:   Prior neck or back surgery (3)?:   When:   Who:   Area of neck/spine operated on:   Level:   Were symptoms improved?:   If improved, describe level of relief:  If improved, how long did you experience relief?:   Do you have any of the following assistive devices? None  How long have you required these assistive devices?   Currently being treated for any other medical condition?: Yes  Conditions: Chron's Disease/IBS,Heart Disease/Attack,migrains  Other Conditions:   Type of neurologic disorder:   Cancer Type:   Other Treatment/Medication: gabapentin   What blood thinners are you currently taking?: Aspirin   Which physicians are treating you for your other medical  conditions?:   Do you smoke?: No  Are you pre-menopausal or post-menopausal?:   If recommended, are you willing to consider surgery?:   What is your goal in seeking treatment?:   Other pertinent information/ general comments/ goals for treatment: no hx of ca     ---------- Care Coordinator Information ----------     Care Coordinator: RS  Activity Log:      Letter/Test Coordinator: Letters  Letter/Test Coordinator Log: 07/12/2012 8:58:15 AM Jared Livengood]: faxed information to Damien in Dr. True private office to call pt and schedule appointment//MLivengood; 07/19/2012 8:39:55 AM [Crystal Tephabock]: Appointment scheduled to see Dr. Leodis on Sept 3rd at 1:15pm. Ctephabock     Intake Specialist Comments: 07/02/2012 2:07:32 PM Juanell Benson]: Attempted to contact patient to obtain his/her medical history. Left message and phone  on answering machine for patient to return our call. Mailed call in reminder post card. IVAR Raw     Clinic Staff Comments:      Last Edited byBETHA Camelia Guardian on 09/10/2012 1:58:35 PM  Last Review by: John Guinea-Bissau on 07/12/2012

## 2012-07-06 ENCOUNTER — Other Ambulatory Visit (INDEPENDENT_AMBULATORY_CARE_PROVIDER_SITE_OTHER): Payer: Self-pay | Admitting: Internal Medicine

## 2012-07-06 MED ORDER — ISOSORBIDE MONONITRATE ER 30 MG TABLET,EXTENDED RELEASE 24 HR
30.0000 mg | ORAL_TABLET | Freq: Every day | ORAL | Status: DC
Start: 2012-07-06 — End: 2015-09-15

## 2012-07-06 MED ORDER — NITROGLYCERIN 0.4 MG SUBLINGUAL TABLET
SUBLINGUAL_TABLET | SUBLINGUAL | Status: AC
Start: 2012-07-06 — End: ?

## 2012-07-13 ENCOUNTER — Observation Stay
Admission: EM | Admit: 2012-07-13 | Discharge: 2012-07-17 | Disposition: A | Discharge status: Home / Self Care | Attending: INTERNAL MEDICINE-ENDOCRINOLOGY-DIABETES AND METABOLISM | Admitting: INTERNAL MEDICINE-ENDOCRINOLOGY-DIABETES AND METABOLISM

## 2012-07-13 ENCOUNTER — Observation Stay (HOSPITAL_BASED_OUTPATIENT_CLINIC_OR_DEPARTMENT_OTHER): Admitting: Internal Medicine

## 2012-07-13 ENCOUNTER — Encounter (HOSPITAL_COMMUNITY): Payer: Self-pay

## 2012-07-13 ENCOUNTER — Emergency Department (EMERGENCY_DEPARTMENT_HOSPITAL)

## 2012-07-13 DIAGNOSIS — I209 Angina pectoris, unspecified: Secondary | ICD-10-CM | POA: Insufficient documentation

## 2012-07-13 DIAGNOSIS — R0789 Other chest pain: Principal | ICD-10-CM | POA: Insufficient documentation

## 2012-07-13 DIAGNOSIS — I10 Essential (primary) hypertension: Secondary | ICD-10-CM | POA: Insufficient documentation

## 2012-07-13 DIAGNOSIS — I2510 Atherosclerotic heart disease of native coronary artery without angina pectoris: Secondary | ICD-10-CM | POA: Insufficient documentation

## 2012-07-13 DIAGNOSIS — E785 Hyperlipidemia, unspecified: Secondary | ICD-10-CM | POA: Insufficient documentation

## 2012-07-13 DIAGNOSIS — R7309 Other abnormal glucose: Secondary | ICD-10-CM | POA: Insufficient documentation

## 2012-07-13 DIAGNOSIS — E781 Pure hyperglyceridemia: Secondary | ICD-10-CM | POA: Insufficient documentation

## 2012-07-13 DIAGNOSIS — K219 Gastro-esophageal reflux disease without esophagitis: Secondary | ICD-10-CM | POA: Insufficient documentation

## 2012-07-13 DIAGNOSIS — I739 Peripheral vascular disease, unspecified: Secondary | ICD-10-CM | POA: Insufficient documentation

## 2012-07-13 DIAGNOSIS — G43909 Migraine, unspecified, not intractable, without status migrainosus: Secondary | ICD-10-CM | POA: Insufficient documentation

## 2012-07-13 LAB — CBC/DIFF
BASOPHILS: 0 %
BASOS ABS: 0.021 THOU/uL (ref 0.0–0.2)
EOS ABS: 0.145 THOU/uL (ref 0.0–0.5)
EOSINOPHIL: 2 %
HCT: 38.1 % (ref 36.7–47.0)
HGB: 13.4 g/dL (ref 12.5–16.3)
LYMPHOCYTES: 36 %
LYMPHS ABS: 3.343 10*3/uL (ref 1.5–3.5)
MCH: 30.7 pg (ref 27.4–33.0)
MCHC: 35.2 g/dL (ref 32.5–35.8)
MCV: 87.4 fL (ref 78–100)
MONOCYTES: 6 %
MONOS ABS: 0.525 THOU/uL (ref 0.3–1.0)
MPV: 8.4 fL (ref 7.5–11.5)
PLATELET COUNT: 178 THOU/uL (ref 140–450)
PMN ABS: 5.188 THOU/uL (ref 2.0–6.5)
PMN'S: 56 %
RBC: 4.36 MIL/uL (ref 4.06–5.63)
RDW: 13.2 % (ref 12.0–15.0)
WBC: 9.2 THOU/uL (ref 3.5–11.0)

## 2012-07-13 LAB — BASIC METABOLIC PANEL
ANION GAP: 7 mmol/L (ref 5–16)
BUN/CREAT RATIO: 10 (ref 6–22)
BUN: 10 mg/dL (ref 8–26)
CALCIUM: 9.2 mg/dL (ref 8.5–10.4)
CARBON DIOXIDE: 29 mmol/L (ref 23–33)
CHLORIDE: 105 mmol/L (ref 96–111)
CREATININE: 1.05 mg/dL (ref 0.62–1.27)
ESTIMATED GLOMERULAR FILTRATION RATE: >59 ml/min/1.73m2 (ref >59)
GLUCOSE,NONFAST: 131 mg/dL (ref 65–139)
POTASSIUM: 3.7 mmol/L (ref 3.5–5.1)
SODIUM: 141 mmol/L (ref 136–145)

## 2012-07-13 LAB — TROPONIN-I (FOR ED ONLY): TROPONIN-I: <0.01 ng/mL (ref <0.030)

## 2012-07-13 LAB — PTT (PARTIAL THROMBOPLASTIN TIME): APTT: 25.2 s (ref 22.0–32.0)

## 2012-07-13 LAB — PT/INR
INR: 1.1 (ref 0.8–1.2)
PROTHROMBIN TIME: 10.9 s (ref 9.1–12.5)

## 2012-07-13 MED ORDER — NITROGLYCERIN 0.4 MG SUBLINGUAL TABLET
SUBLINGUAL_TABLET | SUBLINGUAL | Status: AC
Start: 2012-07-13 — End: 2012-07-13
  Administered 2012-07-13: 0.4 mg via SUBLINGUAL
  Filled 2012-07-13: qty 1

## 2012-07-13 MED ORDER — SODIUM CHLORIDE 0.9 % (FLUSH) INJECTION SYRINGE
2.0000 mL | INJECTION | Freq: Three times a day (TID) | INTRAMUSCULAR | Status: DC
Start: 2012-07-13 — End: 2012-07-17
  Administered 2012-07-14 – 2012-07-15 (×4): 2 mL
  Administered 2012-07-15 – 2012-07-16 (×2): 0 mL
  Administered 2012-07-16: 2 mL
  Administered 2012-07-16 – 2012-07-17 (×2): 0 mL

## 2012-07-13 MED ORDER — DIPHENHYDRAMINE 50 MG/ML INJECTION SOLUTION
INTRAMUSCULAR | Status: AC
Start: 2012-07-13 — End: 2012-07-13
  Administered 2012-07-13: 25 mg via INTRAVENOUS
  Filled 2012-07-13: qty 1

## 2012-07-13 MED ORDER — GABAPENTIN 100 MG CAPSULE
400.00 mg | ORAL_CAPSULE | Freq: Every evening | ORAL | Status: DC
Start: 2012-07-13 — End: 2012-07-13

## 2012-07-13 MED ORDER — ISOSORBIDE MONONITRATE ER 30 MG TABLET,EXTENDED RELEASE 24 HR
30.0000 mg | ORAL_TABLET | Freq: Every day | ORAL | Status: DC
Start: 2012-07-14 — End: 2012-07-17
  Administered 2012-07-14 – 2012-07-17 (×4): 30 mg via ORAL
  Filled 2012-07-13 (×4): qty 1

## 2012-07-13 MED ORDER — METOCLOPRAMIDE 5 MG TABLET
5.00 mg | ORAL_TABLET | Freq: Three times a day (TID) | ORAL | Status: DC
Start: 2012-07-14 — End: 2012-07-16
  Administered 2012-07-14 – 2012-07-15 (×6): 5 mg via ORAL
  Filled 2012-07-13 (×9): qty 1

## 2012-07-13 MED ORDER — GABAPENTIN 100 MG CAPSULE
400.00 mg | ORAL_CAPSULE | Freq: Every evening | ORAL | Status: DC
Start: 2012-07-14 — End: 2012-07-17
  Administered 2012-07-14 – 2012-07-16 (×4): 400 mg via ORAL
  Filled 2012-07-13 (×5): qty 1

## 2012-07-13 MED ORDER — METOCLOPRAMIDE 5 MG/ML INJECTION SOLUTION
INTRAMUSCULAR | Status: AC
Start: 2012-07-13 — End: 2012-07-13
  Administered 2012-07-13: 10 mg via INTRAVENOUS
  Filled 2012-07-13: qty 2

## 2012-07-13 MED ORDER — NITROGLYCERIN 0.4 MG SUBLINGUAL TABLET
0.40 mg | SUBLINGUAL_TABLET | SUBLINGUAL | Status: DC | PRN
Start: 2012-07-13 — End: 2012-07-17

## 2012-07-13 MED ORDER — ASPIRIN 325 MG TABLET
325.0000 mg | ORAL_TABLET | Freq: Every day | ORAL | Status: DC
Start: 2012-07-14 — End: 2012-07-14
  Filled 2012-07-13: qty 1

## 2012-07-13 MED ORDER — METOPROLOL TARTRATE 25 MG TABLET
25.00 mg | ORAL_TABLET | Freq: Two times a day (BID) | ORAL | Status: DC
Start: 2012-07-13 — End: 2012-07-17
  Administered 2012-07-13: 0 mg via ORAL
  Administered 2012-07-14 – 2012-07-17 (×7): 25 mg via ORAL
  Filled 2012-07-13 (×9): qty 1

## 2012-07-13 MED ORDER — MORPHINE 2 MG/ML INJECTION WRAPPER
INJECTION | INTRAMUSCULAR | Status: AC
Start: 2012-07-13 — End: 2012-07-13
  Administered 2012-07-13: 2 mg via INTRAVENOUS
  Filled 2012-07-13: qty 1

## 2012-07-13 MED ORDER — MORPHINE 2 MG/ML INJECTION WRAPPER
2.00 mg | INJECTION | INTRAMUSCULAR | Status: AC
Start: 2012-07-13 — End: 2012-07-13

## 2012-07-13 MED ORDER — METOCLOPRAMIDE 5 MG/ML INJECTION SOLUTION
10.00 mg | INTRAMUSCULAR | Status: AC
Start: 2012-07-13 — End: 2012-07-13

## 2012-07-13 MED ORDER — ENOXAPARIN 40 MG/0.4 ML SUBCUTANEOUS SYRINGE
40.0000 mg | INJECTION | SUBCUTANEOUS | Status: DC
Start: 2012-07-13 — End: 2012-07-17
  Administered 2012-07-14 – 2012-07-16 (×4): 40 mg via SUBCUTANEOUS
  Filled 2012-07-13 (×5): qty 0.4

## 2012-07-13 MED ORDER — DIPHENHYDRAMINE 50 MG/ML INJECTION SOLUTION
25.00 mg | INTRAMUSCULAR | Status: AC
Start: 2012-07-13 — End: 2012-07-13

## 2012-07-13 MED ORDER — DIPHENHYDRAMINE 50 MG/ML INJECTION SOLUTION
25.00 mg | INTRAMUSCULAR | Status: AC
Start: 2012-07-13 — End: 2012-07-14
  Administered 2012-07-14: 25 mg via INTRAVENOUS
  Filled 2012-07-13: qty 1

## 2012-07-13 MED ORDER — SODIUM CHLORIDE 0.9 % IV BOLUS
1000.00 mL | INJECTION | Status: AC
Start: 2012-07-13 — End: 2012-07-13
  Administered 2012-07-13: 1000 mL via INTRAVENOUS
  Administered 2012-07-13: 0 mL via INTRAVENOUS

## 2012-07-13 MED ORDER — DOXEPIN 10 MG CAPSULE
10.0000 mg | ORAL_CAPSULE | Freq: Once | ORAL | Status: DC | PRN
Start: 2012-07-13 — End: 2012-07-17

## 2012-07-13 MED ORDER — ROSUVASTATIN 10 MG TABLET
20.00 mg | ORAL_TABLET | Freq: Every evening | ORAL | Status: DC
Start: 2012-07-13 — End: 2012-07-17
  Administered 2012-07-14 – 2012-07-16 (×4): 20 mg via ORAL
  Filled 2012-07-13 (×5): qty 2

## 2012-07-13 MED ORDER — SODIUM CHLORIDE 0.9 % (FLUSH) INJECTION SYRINGE
2.0000 mL | INJECTION | INTRAMUSCULAR | Status: DC | PRN
Start: 2012-07-13 — End: 2012-07-17

## 2012-07-13 NOTE — ED Nurses Note (Addendum)
Pt reports cardiac history (see flowsheet). Pt reports daily chest pain of 5/10. Past 2 days it has been intermittent 9/10. Pt also reports hx of migraine that has been increasingly worse the past 2 days. Pain is increased in back of head. Today pt reports chest pain radiating to his left jaw & neck, SOB, and numbness in both arms. Pt also reporting back pain and states pain is increased upon palpitation. Pt alert and oriented.  Placed on cardiac monitor.      18G placed in left AC.  Labs drawn per order.   Chest XRAY at bedside.

## 2012-07-13 NOTE — ED Nurses Note (Signed)
 Medication administered as ordered.  Pt given blanket for comfort.

## 2012-07-13 NOTE — H&P (Addendum)
 [place]  [facility]   General Medicine  Admission H&P    Date of Service:  07/13/12  Spencer [name],Spencer [name], 46 y.o. male  Date of Admission:  07/13/2012  Date of Birth:  December 25, 1965  PCP: Charlie FORBES Hoh, DO    Information Obtained from: patient  Chief Complaint:  Chest pain.    HPI: Spencer [name] is a 46 y.o., Black/African American male who presents with with left sided chest pain radiating to the left arm. It is constant, unremitting with no alleviating or exacerbating factors. He says he feels a lot of pressure on his chest The pain started on Tuesday and continued until this evening when he began to experience shortness of breath while seated. Mr. Sokolowski has a history of CAD, a stroke, and a heart catheterization. He denies fevers or chills.    PAST MEDICAL:    Past Medical History   Diagnosis Date   . Colitis    . 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 Migraines    . Reflux    . Chest pain 12/23/2007   . Borderline diabetes mellitus 12/23/2007   . Spells 06/16/2008   . Stress test 05/28/2010   . S/P cardiac catheterization 05/28/2010   . PAD (peripheral artery disease) 06/03/2010   . Hypertriglyceridemia 06/03/2010   . Anginal pain 06/03/2010   . CAD (coronary artery disease) 06/03/2010   . Crohn's disease 06/14/2010   . IBS (irritable bowel syndrome) 06/14/2010   . GERD (gastroesophageal reflux disease) 06/14/2010   . Hyperlipidemia 12/26/2007     denies cp sob or mi hx   . Post traumatic stress disorder (PTSD)      currently being evaluated   . Arthritis      spine   . HTN    . 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 BPH (benign prostatic hyperplasia) 07/15/2011    Past Surgical History   Procedure Date   . Pb colonoscopy,biopsy    . Hx laparotomy    . Hx heart catheterization    . Colonoscopy 09/24/2010     COLONOSCOPY performed by ZORITA RUSH at [facility] OR ENDO        Medications Prior to Admission     Medication    SUMATRIPTAN  SUCC/NAPROXEN  SOD (TREXIMET ORAL)    Take by mouth    isosorbide  mononitrate  (IMDUR ) 30 mg Oral Tablet Sustained Release 24 hr    Take 1 Tab (30 mg total) by mouth Once a day    nitroglycerin  (NITROSTAT ) 0.4 mg Sublingual Tablet, Sublingual    for 3 doses over 15 minutes    Hyoscyamine  Sulfate (ANASPAZ , LEVSIN ) 0.125 mg Oral Tablet    take 1 Tab by mouth Every 6 hours as needed. Please give sublingual form    DOXEPIN  HCL (DOXEPIN  ORAL)    Take 25 mg by mouth Once per day as needed     metoprolol  (LOPRESSOR ) 25 mg Oral Tablet    take 1 Tab by mouth Twice daily.    rosuvastatin  (CRESTOR ) 20 mg Oral Tablet    take 1 Tab by mouth QPM.    Gabapentin  (NEURONTIN ) 400 mg Oral Capsule    take 1 Cap by mouth every night.    aspirin  325 mg Tab    take 325 mg by mouth Once a day.        Allergies   Allergen Reactions   . Adhesive      Red rash  Family History  Family History   Problem Relation Age of Onset   . Hypertension Mother    . Hypertension Brother    . Diabetes Brother    . Hypertension Brother        Social History  History     Social History   . Marital Status: Married     Spouse Name: Steen     Number of Children: 4   . Years of Education: N/A     Occupational History   .  [employer]     Social History Main Topics   . Smoking status: Never Smoker    . Smokeless tobacco: Never Used   . Alcohol Use: No      rarely   . Drug Use: Not on file   . Sexually Active: Not on file     Other Topics Concern   . Ability To Walk 2 Flight Of Steps Without Sob/Cp Yes     Social History Narrative   . No narrative on file       Review of Systems:   Constitutional: Night sweats once a months. No weight loss or fevers.   Skin: No jaundice. No skin cancers. No other worrisome moles or itching.   Eyes: Blurry vision present.No double vision.   ENT: Tinnitis present. No chronic infections. No hearing loss. No dysphagia   Cardiac: Chest pain present radiating to left arm.  Shortness of breath while sitting. PND or orthopnea now. No palpitations or syncope.   Respiratory: No wheeze. No dyspnea on  exertion. No cough.   Heme/Lymph: No lymphadenopathy in neck, axilla or groin. No petechia or purpura.   Endocrine: No polyuria, polydipsia, or polyphagia.   GI: No black or tarry stools. Has had no change in the caliber of stools. No diarrhea or constipation. No heartburn. No nausea or vomiting.   GU: No nocturia No frequency, urgency, hematuria or dysuria.   Musculoskeletal: 2 days of joint aching.   Neuro: Headache and neck pain. Dizziness. No focal weakness, no loss of sensation,Slurred speech present.  No gait or coordination changes. No changes in swallowing. No double vision.     Examination:  Temperature: 36.4 C (97.5 F) Heart Rate: 68  BP (Non-Invasive): 129/72 mmHg   Respiratory Rate: 7  SpO2-1: 100 % Pain Score (Numeric, Faces): 10   General: Patient is AAOx3. Lethargy secondary to administration of narcotics.Does not appear in pain or distress.  Eyes: Conjunctiva clear., Pupils equal and round. , Sclera non-icteric.   HENT:Head atraumatic and normocephalic, ENT without erythema or injection, mucous membranes moist.  Neck: No JVD or thyromegaly or lymphadenopathy  Lungs: Clear to auscultation and percussion bilaterally.   Cardiovascular: regular rate and rhythm, S1, S2 normal, no murmur, click, rub or gallop  Abdomen: Soft, non-tender, Bowel sounds normal  Genito-urinary: Deferred  Extremities: No cyanosis or edema, No synovitis or joint effusions  Skin: Skin warm and dry, No rashes and No lesions  Neurologic: Grossly normal, Alert and oriented x3, No tremor, Difficult to appreciate muscle weakness on flexion or extension in upper or lower extremities.   Lymphatics: No lymphadenopathy  Psychiatric: Affect Normal, Behavior normal, Judgement normal, Speech normal    Labs:    Lab Results for Last 24 Hours:    Results for orders placed during the hospital encounter of 07/13/12 (from the past 24 hour(s))   CBC/DIFF       Component Value Range    WBC 9.2  3.5 - 11.0  THOU/uL    RBC 4.36  4.06 - 5.63 MIL/uL     HGB 13.4  12.5 - 16.3 g/dL    HCT 61.8  63.2 - 52.9 %    MCV 87.4  78 - 100 fL    MCH 30.7  27.4 - 33.0 pg    MCHC 35.2  32.5 - 35.8 g/dL    RDW 86.7  87.9 - 84.9 %    PLATELET COUNT 178  140 - 450 THOU/uL    MPV 8.4  7.5 - 11.5 fL    PMN'S 56      PMN ABS 5.188  2.0 - 6.5 THOU/uL    LYMPHOCYTES 36      LYMPHS ABS 3.343  1.5 - 3.5 THOU/uL    MONOCYTES 6      MONOS ABS 0.525  0.3 - 1.0 THOU/uL    EOSINOPHIL 2      EOS ABS 0.145  0.0 - 0.5 THOU/uL    BASOPHILS 0      BASOS ABS 0.021  0.0 - 0.2 THOU/uL   BASIC METABOLIC PANEL, NON-FASTING       Component Value Range    SODIUM 141  136 - 145 mmol/L    POTASSIUM 3.7  3.5 - 5.1 mmol/L    CHLORIDE 105  96 - 111 mmol/L    CARBON DIOXIDE 29  23 - 33 mmol/L    ANION GAP 7  5 - 16 mmol/L    CREATININE 1.05  0.62 - 1.27 mg/dL    ESTIMATED GLOMERULAR FILTRATION RATE >59  >59 ml/min/1.39m2    GLUCOSE,NONFAST 131  65 - 139 mg/dL    BUN 10  8 - 26 mg/dL    BUN/CREAT RATIO 10  6 - 22    CALCIUM 9.2  8.5 - 10.4 mg/dL   PT/INR       Component Value Range    PROTHROMBIN TIME 10.9  9.1 - 12.5 Sec    INR 1.1  0.8 - 1.2   PTT (PARTIAL THROMBOPLASTIN TIME)       Component Value Range    APTT 25.2  22.0 - 32.0 Sec   TROPONIN-I (FOR ED ONLY)       Component Value Range    TROPONIN-I <0.010  <0.030 ng/mL       Imaging Studies:  N/A    DNR Status:  Prior    Assessment/Plan:   Active Hospital Problems    Diagnosis   . Primary Problem: Anginal pain   . Hypertriglyceridemia   . Hyperlipidemia   . Borderline diabetes mellitus     Non cardiac Chest Pain  - Troponins negative x2.   - EKG shows new 1rst degree AV block  - Nitroglycerin  .4 mg prn  - Unlikely that this is a new infarct. Will monitor patient.   - Patient currently NPO for possible MPS.  - Negative cardiac cath/ MPS in 2009.  - Continue metoprolol   - Continue IMDUR  30 mg daily  - Aspirin  81 daily  - Continue crestor  20 mg.  - Risk factors:  HTN  Hyperlipidemia    HTN  - Metoprolol     Hypertryglyceridemia.  - lipid panel pending  -  previous lipid panel abnormal w/ triglycerides greater than 160.     DVT/PE Prophylaxis: Lovenox     Ria HILARIO Dun, MD  8/17/20132:31 AM    [place]  Department of Internal Medicine  PGY1    I performed a history and physical exam of the patient and  discussed patient's management with the resident.  I reviewed the resident's note and agree with the resident's findings and plan.  Any emendations to the above note were made directly in the note.  I am signing this note 07/16/12 but saw the patient with the resident team on 07/14/12.  Harlene Neth, MD  Assistant Professor  Endocrinology & Metabolism  [place] Department of Medicine

## 2012-07-13 NOTE — ED Nurses Note (Signed)
Pt  Complaining of chest pain and headache 10/10. Provider notified.  Pt refusing nitro at this time. Morphine administered per order.  Pt resting in bed at this time.

## 2012-07-13 NOTE — ED Nurses Note (Signed)
Consultation at bedside.      Pt reports chest pain came down to 5/10.  HA is still around 8-9/10.  Provider notified.

## 2012-07-13 NOTE — ED Nurses Note (Signed)
Consultation at bedside.

## 2012-07-13 NOTE — ED Attending Note (Signed)
 Note begun by:  Isaiah Boer, MD 07/13/2012, 8:17 PM    I was physically present and directly supervised this patient's care.  Patient seen and examined.  Resident / Vito / NP history and exam reviewed.   Key elements in addition to and/or correction of that documentation are as follows:    HPI :    46 y.o. male presents with chief complaint of chest pain. Hx of myocardial bridging. He has chest pain. Is located over the L sternal area and radiates to the neck and L shoulder. He has a baseline CP from the bridging but this is a new CP for 2 days. He also has some SOB today. And nausea. He has already had ASA. Did not try nitro. Also palpitations and back pain. The back pain is a soreness. Is interscapular. Not a smoker. He also says he has had a headache for 2 days. He does not have a hx of migraines. Last cath in 2009.     PE :   VS on presentation: Blood pressure 110/78, pulse 74, temperature 36.4 C (97.5 F), resp. rate 18, height 1.778 m (5' 10), weight 97.523 kg (215 lb), SpO2 96.00%.  Heart RRR, lungs clear. abd soft and nontender, there L anterior neck tenderness and L sternal tenderness. No lower ext edema or tenderness.   Data/Test :          Review of Prior Data :             Clinical Impression :   Chest pain. Headache.     MDM :       ED Course :        Plan :   Admitted.     CRITICAL CARE : None

## 2012-07-13 NOTE — ED Resident Handoff Note (Signed)
Code Status: Full    Allergies   Allergen Reactions   . Adhesive      Red rash       Filed Vitals:    07/13/12 1944 07/13/12 2018   BP: 110/78 124/74   Pulse: 74 68   Temp: 36.4 C (97.5 F)    Resp: 18 12   Height: 1.778 m (5\' 10" )    Weight: 97.523 kg (215 lb)    SpO2: 96% 100%       HPI:  In brief, patient is a 46 y.o. malewho presents to the emergency department due to chest pain, HA x 2 days.  H/o myocardial bridging.  Associated SOB.  Also with back pain.      Pertinent Exam Findings:  Uncomfortable due to pain  TTP left sternum  TTP thoracic spine  CTA  RRR    Pertinent Imaging/Lab results:    Labs Reviewed   CBC/DIFF   BASIC METABOLIC PANEL, NON-FASTING   PT/INR   PTT (PARTIAL THROMBOPLASTIN TIME)   TROPONIN-I (FOR ED ONLY)     EKG: unremarkable    Pending Studies:  None    Consults:  Medicine    Plan:  Admit to Medicine    After a thorough discussion of the patient including presentation, ED course, and review of above information I have assumed care of Celso Amy Skluzacek from Dr. Deatra Robinson at 8:59 PM    Chad Cordial, MD    Course:  Admitted to Medicine.    Chad Cordial, MD 07/20/2012, 7:26 PM

## 2012-07-13 NOTE — ED Provider Notes (Signed)
The following note was written per dictation of Dr. Deatra Robinson. Spencer [name]  Attending physician- Dr. Mackey [name]  CC- Headache and Chest Pain  HPI  Spencer [name] is a 46 y.o. male who reports to ED via private vehicle c/o headache and chest pain. Pt states that he has had a headache and chest pain for the past 2 days. Pt states that it is associated with SOB, nausea, palpitations, and back pain that is not radiated from chest. Pt states that his back feels "sore like a bruise". Pt states that the pain is mostly between his shoulder blades. 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 Myer [name] of [facility] for this. Pt also has a history of PAD HTN, High cholesterol, DM, Crohn's Disease, and IBS. Pt has a family history of MI and CAD. Pt denies tobacco use and drug use. Pt denies history of DVT. Pt has NKDA.      Review of Systems  Constitutional: No fever, chills or weakness   Skin: No rash or diaphoresis  HENT: No congestion. +headache  Eyes: No vision changes   Cardio: No leg swelling. +chest pain, +palpitations  Respiratory: No cough, wheezing. +SOB  GI:  No vomiting or stool changes. +nausea  [name]:  No urinary changes  MSK: No joint pain. +back pain  Neuro: No seizures or LOC  Psychiatric: No depression, SI or substance abuse  All other systems reviewed and are negative.      History:   PMH:    Past Medical History   Diagnosis Date   . Colitis    . 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 Migraines    . Reflux    . Chest pain 12/23/2007   . Borderline diabetes mellitus 12/23/2007   . Spells 06/16/2008   . Stress test 05/28/2010   . S/P cardiac catheterization 05/28/2010   . PAD (peripheral artery disease) 06/03/2010   . Hypertriglyceridemia 06/03/2010   . Anginal pain 06/03/2010   . CAD (coronary artery disease) 06/03/2010   . Crohn's disease 06/14/2010   . IBS (irritable bowel syndrome) 06/14/2010    . GERD (gastroesophageal reflux disease) 06/14/2010   . Hyperlipidemia 12/26/2007     denies cp sob or mi hx   . Post traumatic stress disorder (PTSD)      currently being evaluated   . Arthritis      spine   . HTN    . 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 BPH (benign prostatic hyperplasia) 07/15/2011     PSH:    Past Surgical History   Procedure Date   . Pb colonoscopy,biopsy    . Hx laparotomy    . Hx heart catheterization    . Colonoscopy 09/24/2010     COLONOSCOPY performed by Spencer [name] at [facility] OR ENDO     Social Hx:    History     Social History   . Marital Status: Married     Spouse Name: Spencer [name]     Number of Children: 4   . Years of Education: N/A     Occupational History   .  [employer]     Social History Main Topics   . Smoking status: Never Smoker    . Smokeless tobacco: Not on file   . Alcohol Use: Yes      rarely   . Drug Use: No   .  Sexually Active: Not on file     Other Topics Concern   . Ability To Walk 2 Flight Of Steps Without Sob/Cp Yes     Social History Narrative   . No narrative on file     Family Hx:   Family History   Problem Relation Age of Onset   . Hypertension Mother    . Hypertension Brother    . Diabetes Brother    . Hypertension Brother      Allergies:   Allergies   Allergen Reactions   . Adhesive      Red rash       Above history reviewed with patient, changes are as documented.      Physical Exam  Nursing notes reviewed.    ED Triage Vitals   Enc Vitals Group      BP (Non-Invasive) 07/13/12 1944 110/78 mmHg      Heart Rate 07/13/12 1944 74       Respiratory Rate 07/13/12 1944 18       Temperature 07/13/12 1944 36.4 C (97.5 F)      Temp src --       SpO2-1 07/13/12 1944 96 %      Weight 07/13/12 1944 97.523 kg (215 lb)      Height 07/13/12 1944 1.778 m (5\' 10" )      Head Cir --       Peak Flow --       Pain Score --       Pain Loc --       Pain Edu? --       Excl. in GC? --      Constitutional: NAD. Oriented  HENT:   Head: Normocephalic and atraumatic.    Mouth/Throat: Oropharynx is clear and moist.   Eyes: EOMI, PERRL   Neck: Trachea midline. Neck supple.  Cardiovascular: RRR, No murmurs, rubs or gallops. Intact distal pulses.  Pulmonary/Chest: BS equal bilaterally. No respiratory distress. No wheezes, rales. L sternal border chest tenderness.   Abdominal: BS +. Abdomen soft, no tenderness, rebound or guarding.               Musculoskeletal: No edema, tenderness or deformity. Upper midline T-spine TTP.  Skin: warm and dry. No rash, erythema, pallor or cyanosis. Old bruised to bilateral ankles.  Psychiatric: normal mood and affect. Behavior is normal.   Neurological: Alert. Grossly intact.      Course  MDM      Impression/Plan: Headache and chest pain with concern for ACS vs PE vs Pneumonia vs Musculoskeletal vs Anxiety vs Migraine vs Tension headache. Ordered CP protocol. Given Benadryl, Reglan, fluids, and Nitro.    Orders Placed This Encounter   . XR AP MOBILE CHEST   . CBC/DIFF   . BASIC METABOLIC PANEL, NON-FASTING   . PT/INR   . PTT (PARTIAL THROMBOPLASTIN TIME)   . TROPONIN-I (FOR ED ONLY)   . ECG 12-LEAD   . INSERT & MAINTAIN PERIPHERAL IV ACCESS   . metoclopramide (REGLAN) 5 mg/mL injection   . NS bolus infusion 1,000 mL   . diphenhydrAMINE (BENADRYL) 50 mg/mL injection   . nitroglycerin (NITROSTAT) sublingual tablet   . nitroglycerin (NITROSTAT) sublingual tablet ---[employer]   . diphenhydrAMINE (BENADRYL) injection ---Jaymes Graff   . metoclopramide HCl (REGLAN) injection ---[employer] Reviewed   CBC/DIFF   BASIC METABOLIC PANEL, NON-FASTING   PT/INR   PTT (PARTIAL THROMBOPLASTIN TIME)  TROPONIN-I (FOR ED ONLY)     All labs were reviewed.    Radiographical Imaging:   CXR: No evidence for acute cardiopulmonary process.  EKG: NSR.    Medical Records reviewed.    Pt states pain mildly improved with nitro, ha improved with reglan, benadryl, IVF    Consulted Cardiology at 8:53 PM for admission.     Care transferred to Dr. Dara [name] at 8:57 PM.  I have reviewed and verified the information in the above note is an accurate dictation of the information supplied to Spencer [name], ED Scribe.  Selinda Orion, MD 07/14/2012, 9:23 AM

## 2012-07-13 NOTE — ED Nurses Note (Signed)
 Report to Zack on [facility]. Patient transported via tele track.

## 2012-07-13 NOTE — Nurses Notes (Signed)
Patient requesting pain medication at this time.  Service notified, orders to be placed for pain medication.  Will continue to monitor.

## 2012-07-14 ENCOUNTER — Observation Stay (HOSPITAL_BASED_OUTPATIENT_CLINIC_OR_DEPARTMENT_OTHER)

## 2012-07-14 LAB — LIPID PANEL
CHOLESTEROL: 167 mg/dL (ref <200)
HDL-CHOLESTEROL: 32 mg/dL — ABNORMAL LOW (ref >39)
LDL (CALCULATED): 94 mg/dL (ref <100)
NON - HDL (CALCULATED): 135 mg/dL (ref <190)
TRIGLYCERIDES: 205 mg/dL — ABNORMAL HIGH (ref <150)
VLDL (CALCULATED): 41 mg/dL — ABNORMAL HIGH (ref <30)

## 2012-07-14 LAB — CBC/DIFF
BASOPHILS: 0 %
BASOS ABS: 0 10*3/uL (ref 0.0–0.2)
EOS ABS: 0.2 10*3/uL (ref 0.0–0.5)
EOSINOPHIL: 2 %
HCT: 35.8 % — ABNORMAL LOW (ref 36.7–47.0)
HGB: 12.5 g/dL (ref 12.5–16.3)
LYMPHOCYTES: 43 %
LYMPHS ABS: 3.4 THOU/uL (ref 1.5–3.5)
MCH: 30.4 pg (ref 27.4–33.0)
MCHC: 34.9 g/dL (ref 32.5–35.8)
MCV: 87.2 fL (ref 78–100)
MONOCYTES: 8 %
MONOS ABS: 0.6 10*3/uL (ref 0.3–1.0)
MPV: 9.2 fL (ref 7.5–11.5)
PLATELET COUNT: 161 THOU/uL (ref 140–450)
PMN ABS: 3.7 10*3/uL (ref 2.0–6.5)
PMN'S: 47 %
RBC: 4.1 MIL/uL (ref 4.06–5.63)
RDW: 13.1 % (ref 12.0–15.0)
WBC: 8 10*3/uL (ref 3.5–11.0)

## 2012-07-14 LAB — BASIC METABOLIC PANEL
ANION GAP: 7 mmol/L (ref 5–16)
BUN/CREAT RATIO: 12 (ref 6–22)
BUN: 10 mg/dL (ref 8–26)
CALCIUM: 8.5 mg/dL (ref 8.5–10.4)
CARBON DIOXIDE: 26 mmol/L (ref 23–33)
CHLORIDE: 108 mmol/L (ref 96–111)
CREATININE: 0.86 mg/dL (ref 0.62–1.27)
ESTIMATED GLOMERULAR FILTRATION RATE: >59 mL/min/{1.73_m2} (ref >59)
GLUCOSE,NONFAST: 98 mg/dL (ref 65–139)
POTASSIUM: 4 mmol/L (ref 3.5–5.1)
SODIUM: 141 mmol/L (ref 136–145)

## 2012-07-14 LAB — PERFORM POC WHOLE BLOOD GLUCOSE: GLUCOSE, POINT OF CARE: 101 mg/dL (ref 70–105)

## 2012-07-14 LAB — PHOSPHORUS: PHOSPHORUS: 5.1 mg/dL — ABNORMAL HIGH (ref 2.4–4.7)

## 2012-07-14 LAB — MAGNESIUM: MAGNESIUM: 1.8 mg/dL (ref 1.7–2.5)

## 2012-07-14 LAB — TROPONIN-I: TROPONIN-I: <0.01 ng/mL (ref <0.030)

## 2012-07-14 MED ORDER — HYDROMORPHONE 1 MG/ML INJECTION WRAPPER
0.2000 mg | INJECTION | Freq: Once | INTRAMUSCULAR | Status: AC
Start: 2012-07-14 — End: 2012-07-14
  Administered 2012-07-14: 0.2 mg via INTRAVENOUS
  Filled 2012-07-14: qty 1

## 2012-07-14 MED ORDER — NAPROXEN 375 MG TABLET
375.00 mg | ORAL_TABLET | ORAL | Status: AC
Start: 2012-07-14 — End: 2012-07-14
  Administered 2012-07-14: 375 mg via ORAL
  Filled 2012-07-14 (×2): qty 1

## 2012-07-14 MED ORDER — ASPIRIN 81 MG CHEWABLE TABLET
81.0000 mg | CHEWABLE_TABLET | Freq: Every day | ORAL | Status: DC
Start: 2012-07-14 — End: 2012-07-17
  Administered 2012-07-14 – 2012-07-17 (×4): 81 mg via ORAL
  Filled 2012-07-14 (×4): qty 1

## 2012-07-14 MED ORDER — KETOROLAC 30 MG/ML (1 ML) INJECTION SOLUTION
30.0000 mg | Freq: Four times a day (QID) | INTRAMUSCULAR | Status: DC | PRN
Start: 2012-07-14 — End: 2012-07-17
  Administered 2012-07-14 – 2012-07-16 (×8): 30 mg via INTRAVENOUS
  Filled 2012-07-14 (×12): qty 1

## 2012-07-14 MED ORDER — NAPROXEN 500 MG TABLET
500.0000 mg | ORAL_TABLET | Freq: Once | ORAL | Status: AC
Start: 2012-07-14 — End: 2012-07-14
  Administered 2012-07-14: 500 mg via ORAL
  Filled 2012-07-14 (×2): qty 1

## 2012-07-14 MED ORDER — METOCLOPRAMIDE 10 MG TABLET
10.0000 mg | ORAL_TABLET | Freq: Four times a day (QID) | ORAL | Status: DC | PRN
Start: 2012-07-14 — End: 2012-07-14

## 2012-07-14 NOTE — Nurses Notes (Signed)
Contacted service regarding Toradol not controlling patient's pain.  Awaiting orders

## 2012-07-14 NOTE — Care Management Notes (Signed)
[facility]  Care Management Note    Patient Name: Spencer [name]  Date of Birth: 01/29/1966  Sex: male  Date/Time of Admission: 07/13/2012  7:46 PM  Room/Bed: 718/A  Payor: BLUE CROSS BLUE SHIELD  Plan: FEDERAL BLUE CROSS  Product Type: PPO     LOS: 1 day   Admitting Diagnosis:  CHEST PAIN    Subjective/Objective: MSW consulted for advance directives. MSW and MSW [place] met with pt and spouse at bedside, completed Combined MPOA/LW. Copied, stickered, placed in pt's chart, returned original to pt. Faxed copy to e-directive registry.    Assessment:    07/14/12 0800   ADVANCE DIRECTIVES   !! Does the Patient have an Advance Directive? Yes, Patient Does Have Advance Directive for Healthcare Treatment   Type of Advance Directive Completed Medical Living Will;Medical Power of Attorney   Copy of Advance Directives in Chart? 0   Name of MPOA or Healthcare Surrogate Payden Docter   Phone Number of MPOA or Healthcare Surrogate [phone]   [phone]          The patient will continue to be evaluated for developing discharge needs.     Case Manager: Gardner Candle, MSW 07/14/2012, 8:49 AM  Phone: [phone]

## 2012-07-14 NOTE — Nurses Notes (Signed)
Paged service regarding pain medication.  Awaiting orders

## 2012-07-14 NOTE — Nurses Notes (Signed)
Pt returned from CT °

## 2012-07-14 NOTE — Nurses Notes (Signed)
 Patient continue to complain of pain and a headache, service notified.  No new orders obtained at this time.  Will continue to monitor.

## 2012-07-14 NOTE — Nurses Notes (Signed)
 Patient arrived to floor.  Vitally stable.  Service notified of pain issues.  NPO at this time.  Spouse at bedside.  No complaints.  Will continue to monitor.

## 2012-07-14 NOTE — Nurses Notes (Signed)
Pt off the floor to CT.

## 2012-07-14 NOTE — Nurses Notes (Signed)
Service to see pt regarding symptoms

## 2012-07-14 NOTE — Nurses Notes (Signed)
Paged service regarding pt's pain.  Uncontrolled with current regimen

## 2012-07-14 NOTE — Nurses Notes (Signed)
Pt complaining of dizziness and blurred vision.  Awaiting orders.

## 2012-07-14 NOTE — Nurses Notes (Signed)
Patient still c/o HA and pain at base of neck, notified Dr. Verdie Mosher, she stated she will place orders. Will monitor.

## 2012-07-14 NOTE — Progress Notes (Addendum)
 CROSS COVERAGE    Was called by nurse stating patient had a throbbing occipital headache not relieved by Toradol . I ordered 1 x dose of 500 mg naprosyn .    Everlina Lyndall Char, MD, 07/14/2012, 4:19 PM  PGY-1  INTERNAL MEDICINE    Harlene Neth, MD  Assistant Professor  Endocrinology & Metabolism  [facility] Department of Medicine

## 2012-07-14 NOTE — Nurses Notes (Signed)
Contacted service regarding pt's diet  Contacted about something for pain.  Awaiting orders.

## 2012-07-14 NOTE — Care Plan (Signed)
 Problem: General Plan of Care(Adult,OB)  Goal: Plan of Care Review(Adult,OB)  The patient and/or their representative will communicate an understanding of their plan of care   Outcome: Ongoing (see interventions/notes)  Patient admitted for CP radiating to left jaw and neck, SOB, headaches , and numbness in both arms.  Alert and oriented, denies any N/T at this time.  Patient maintaining oxygen saturation on 1 liter at 99%.  Patient states he has a hx of myocardial bridging.  Monitored via telemetry.  Spouse at bedside.  Discharge planning in progress.  Will continue to monitor.

## 2012-07-14 NOTE — Nurses Notes (Signed)
Paged service regarding pain medication.  No new orders obtained at this time.  Will continue to monitor.

## 2012-07-14 NOTE — Progress Notes (Addendum)
 [place]  [facility]  Medicine Progress Note    Spencer [name]  Date of service: 07/14/2012  Date of Admission:  07/13/2012    Hospital Day:  LOS: 1 day     Subjective: I saw and examined patient at his bedside this am. His wife was present in the room as well. Patient reports that overnight he still has some chest pain which feels like pressure to left of sternum radiating to the left arm. Patient reports chest pain has decreased to a 5 or 6 out of 10 and feels like a bruise. Patient also c/o headache in occipital region that radiates bilaterally to forehead and top of head. Patient says light and sound make his headache worse. Patient says he took Treximet at home but had no relief. Patient also reports pain in the lower back like a pressure on his spine and pain between his shoulder blades. Patient reports SOB and nausea. Patient denies vomiting. Patient denies fever or chills. Patient reports night sweats and night terrors and reports h/o PTSD.     Planned for discharge if 3rd troponin is negative. Follow up with cardiology and PCP.     Vital Signs:  Temp (24hrs) Max:36.8 C (98.2 F)      Systolic (24hrs), Avg:117 mmHg, Min:108 mmHg, Max:129 mmHg    Diastolic (24hrs), Avg:71 mmHg, Min:67 mmHg, Max:78 mmHg    Temp  Avg: 36.6 C (97.8 F)  Min: 36.4 C (97.5 F)  Max: 36.8 C (98.2 F)  Pulse  Avg: 69.4   Min: 62   Max: 75   Resp  Avg: 15.9   Min: 7   Max: 28   SpO2  Avg: 98.5 %  Min: 96 %  Max: 100 %  MAP (Non-Invasive)  Avg: 88 mmHG  Min: 84 mmHG  Max: 94 mmHG  Pain Score (Numeric, Faces): 5  Fi02    I/O:  I/O last 24 hours:    Intake/Output Summary (Last 24 hours) at 07/14/12 0758  Last data filed at 07/14/12 0114   Gross per 24 hour   Intake      0 ml   Output      0 ml   Net      0 ml     I/O current shift:       Current Facility-Administered Medications:  aspirin  chewable tablet 81 mg 81 mg Oral Daily   nitroglycerin  (NITROSTAT ) sublingual tablet 0.4 mg Sublingual Q5 Min PRN   doxepin   (SINEQUAN ) capsule 10 mg Oral Dialysis x1 PRN   isosorbide  mononitrate (IMDUR ) 24 hr extended release tablet 30 mg Oral Daily   metoprolol  tartrate (LOPRESSOR ) tablet 25 mg Oral 2x/day   nitroglycerin  (NITROSTAT ) sublingual tablet 0.4 mg Sublingual Q5 Min PRN   rosuvastatin  (CRESTOR ) tablet 20 mg Oral QPM   metoclopramide  (REGLAN ) tablet 5 mg Oral 3x/day AC   enoxaparin  (LOVENOX ) 40 mg/0.4 mL SubQ injection 40 mg Subcutaneous Q24H   NS flush syringe 2 mL Intracatheter Q8HRS   And      NS flush syringe 2-6 mL Intracatheter Q1 MIN PRN   gabapentin  (NEURONTIN ) capsule 400 mg 400 mg Oral NIGHTLY       Allergies   Allergen Reactions   . Adhesive      Red rash       Physical Exam:  General: appears in good health, appears stated age and no distress  Eyes: Conjunctiva clear., Pupils equal and round.   HENT:Head atraumatic and normocephalic  Neck: no adenopathy  Lungs: Clear to auscultation bilaterally.   Cardiovascular: regular rate and rhythm  no murmurs  Abdomen: Soft, non-tender, Non-tender, non-distended, scar from laparoscopic surgery above navel   Extremities: No cyanosis or edema    Labs:  Recent Results (from the past 24 hour(s))   CBC/DIFF       Component Value Range    WBC 9.2  3.5 - 11.0 THOU/uL    RBC 4.36  4.06 - 5.63 MIL/uL    HGB 13.4  12.5 - 16.3 g/dL    HCT 61.8  63.2 - 52.9 %    MCV 87.4  78 - 100 fL    MCH 30.7  27.4 - 33.0 pg    MCHC 35.2  32.5 - 35.8 g/dL    RDW 86.7  87.9 - 84.9 %    PLATELET COUNT 178  140 - 450 THOU/uL    MPV 8.4  7.5 - 11.5 fL    PMN'S 56      PMN ABS 5.188  2.0 - 6.5 THOU/uL    LYMPHOCYTES 36      LYMPHS ABS 3.343  1.5 - 3.5 THOU/uL    MONOCYTES 6      MONOS ABS 0.525  0.3 - 1.0 THOU/uL    EOSINOPHIL 2      EOS ABS 0.145  0.0 - 0.5 THOU/uL    BASOPHILS 0      BASOS ABS 0.021  0.0 - 0.2 THOU/uL   BASIC METABOLIC PANEL, NON-FASTING       Component Value Range    SODIUM 141  136 - 145 mmol/L    POTASSIUM 3.7  3.5 - 5.1 mmol/L    CHLORIDE 105  96 - 111 mmol/L    CARBON DIOXIDE 29  23  - 33 mmol/L    ANION GAP 7  5 - 16 mmol/L    CREATININE 1.05  0.62 - 1.27 mg/dL    ESTIMATED GLOMERULAR FILTRATION RATE >59  >59 ml/min/1.78m2    GLUCOSE,NONFAST 131  65 - 139 mg/dL    BUN 10  8 - 26 mg/dL    BUN/CREAT RATIO 10  6 - 22    CALCIUM 9.2  8.5 - 10.4 mg/dL   PT/INR       Component Value Range    PROTHROMBIN TIME 10.9  9.1 - 12.5 Sec    INR 1.1  0.8 - 1.2   PTT (PARTIAL THROMBOPLASTIN TIME)       Component Value Range    APTT 25.2  22.0 - 32.0 Sec   TROPONIN-I (FOR ED ONLY)       Component Value Range    TROPONIN-I <0.010  <0.030 ng/mL   CBC/DIFF       Component Value Range    WBC 8.0  3.5 - 11.0 THOU/uL    RBC 4.10  4.06 - 5.63 MIL/uL    HGB 12.5  12.5 - 16.3 g/dL    HCT 64.1 (*) 63.2 - 47.0 %    MCV 87.2  78 - 100 fL    MCH 30.4  27.4 - 33.0 pg    MCHC 34.9  32.5 - 35.8 g/dL    RDW 86.8  87.9 - 84.9 %    PLATELET COUNT 161  140 - 450 THOU/uL    MPV 9.2  7.5 - 11.5 fL    PMN'S 47      PMN ABS 3.700  2.0 - 6.5 THOU/uL    LYMPHOCYTES 43      LYMPHS  ABS 3.400  1.5 - 3.5 THOU/uL    MONOCYTES 8      MONOS ABS 0.600  0.3 - 1.0 THOU/uL    EOSINOPHIL 2      EOS ABS 0.200  0.0 - 0.5 THOU/uL    BASOPHILS 0      BASOS ABS 0.000  0.0 - 0.2 THOU/uL   BASIC METABOLIC PANEL, NON-FASTING       Component Value Range    SODIUM 141  136 - 145 mmol/L    POTASSIUM 4.0  3.5 - 5.1 mmol/L    CHLORIDE 108  96 - 111 mmol/L    CARBON DIOXIDE 26  23 - 33 mmol/L    ANION GAP 7  5 - 16 mmol/L    CREATININE 0.86  0.62 - 1.27 mg/dL    ESTIMATED GLOMERULAR FILTRATION RATE >59  >59 ml/min/1.76m2    GLUCOSE,NONFAST 98  65 - 139 mg/dL    BUN 10  8 - 26 mg/dL    BUN/CREAT RATIO 12  6 - 22    CALCIUM 8.5  8.5 - 10.4 mg/dL   MAGNESIUM        Component Value Range    MAGNESIUM  1.8  1.7 - 2.5 mg/dL   PHOSPHORUS       Component Value Range    PHOSPHORUS 5.1 (*) 2.4 - 4.7 mg/dL   TROPONIN-I       Component Value Range    TROPONIN-I <0.010  <0.030 ng/mL   LIPID PANEL       Component Value Range    TRIGLYCERIDES 205 (*) <150 mg/dL    CHOLESTEROL 832   <799 mg/dL    HDL-CHOLESTEROL 32 (*) >39 mg/dL    LDL (CALCULATED) 94  <100 mg/dL    VLDL (CALCULATED) 41 (*) <30 mg/dL    NON - HDL (CALCULATED) 135  <190 mg/dL     Radiology:  CXR no acute cardiopulmonary process.     PT/OT: No    Consults: no    Hardware (lines, foley's, tubes): PIV, telemetry    Assessment/ Plan:   Active Hospital Problems    Diagnosis   . Primary Problem: Anginal pain   . Hypertriglyceridemia   . Hyperlipidemia   . Borderline diabetes mellitus     Non cardiac Chest Pain   - Troponins negative x2.   - EKG shows new 1st degree AV block   - Nitroglycerin  .4 mg prn   - Unlikely that this is a new infarct. Will monitor patient.   - Patient currently NPO for possible MPS.   - Negative cardiac cath/ MPS in 2009.   - Continue metoprolol    - Continue IMDUR  30 mg daily   - Aspirin  81 daily   - Continue crestor  20 mg.       HTN   - Metoprolol      Hypertryglyceridemia  - lipid panel pending   - previous lipid panel abnormal w/ triglycerides greater than 160.       DVT/PE Prophylaxis: Lovenox     Disposition Planning: Home discharge      Eveleen JAYSON Bars, MD 07/14/2012, 1:09 PM    I performed a history and physical exam of the patient and discussed patient's management with the resident.  I reviewed the resident's note and agree with the resident's findings and plan.  Any emendations to the above note were made directly in the note.  Pt c/o unusual posterior head and neck pain, atypical of his usual migraines.  Will get ct.  Harlene Neth, MD  Assistant Professor  Endocrinology & Metabolism  [place] [facility]

## 2012-07-15 ENCOUNTER — Observation Stay (HOSPITAL_BASED_OUTPATIENT_CLINIC_OR_DEPARTMENT_OTHER)

## 2012-07-15 LAB — BASIC METABOLIC PANEL
ANION GAP: 6 mmol/L (ref 5–16)
BUN/CREAT RATIO: 8 (ref 6–22)
CALCIUM: 8.9 mg/dL (ref 8.5–10.4)
CARBON DIOXIDE: 25 mmol/L (ref 23–33)
CHLORIDE: 106 mmol/L (ref 96–111)
CREATININE: 0.91 mg/dL (ref 0.62–1.27)
ESTIMATED GLOMERULAR FILTRATION RATE: >59 ml/min/1.73m2 (ref >59)
GLUCOSE,NONFAST: 93 mg/dL (ref 65–139)
POTASSIUM: 3.8 mmol/L (ref 3.5–5.1)
SODIUM: 137 mmol/L (ref 136–145)

## 2012-07-15 LAB — CBC/DIFF
BASOPHILS: 0 %
BASOS ABS: 0 THOU/uL (ref 0.0–0.2)
EOS ABS: 0.2 THOU/uL (ref 0.0–0.5)
EOSINOPHIL: 2 %
HCT: 37.3 % (ref 36.7–47.0)
HGB: 13.1 g/dL (ref 12.5–16.3)
LYMPHOCYTES: 38 %
LYMPHS ABS: 3.5 THOU/uL (ref 1.5–3.5)
MCH: 30.7 pg (ref 27.4–33.0)
MCHC: 35.2 g/dL (ref 32.5–35.8)
MCV: 87.2 fL (ref 78–100)
MONOCYTES: 8 %
MONOS ABS: 0.7 10*3/uL (ref 0.3–1.0)
MPV: 9 fL (ref 7.5–11.5)
PLATELET COUNT: 164 THOU/uL (ref 140–450)
PMN ABS: 4.8 THOU/uL (ref 2.0–6.5)
PMN'S: 52 %
RBC: 4.28 MIL/uL (ref 4.06–5.63)
RDW: 13.2 % (ref 12.0–15.0)
WBC: 9.3 10*3/uL (ref 3.5–11.0)

## 2012-07-15 LAB — PHOSPHORUS: PHOSPHORUS: 4.4 mg/dL (ref 2.4–4.7)

## 2012-07-15 LAB — TROPONIN-I
TROPONIN-I: <0.01 ng/mL (ref <0.030)
TROPONIN-I: <0.01 ng/mL (ref <0.030)

## 2012-07-15 MED ORDER — FAMOTIDINE 20 MG TABLET
20.00 mg | ORAL_TABLET | Freq: Two times a day (BID) | ORAL | Status: DC
Start: 2012-07-15 — End: 2012-07-16
  Administered 2012-07-15 – 2012-07-16 (×3): 20 mg via ORAL
  Filled 2012-07-15 (×4): qty 1

## 2012-07-15 MED ORDER — ACETAMINOPHEN 325 MG TABLET
650.0000 mg | ORAL_TABLET | Freq: Four times a day (QID) | ORAL | Status: DC
Start: 2012-07-15 — End: 2012-07-15

## 2012-07-15 MED ORDER — ACETAMINOPHEN 325 MG TABLET
650.00 mg | ORAL_TABLET | Freq: Four times a day (QID) | ORAL | Status: DC
Start: 2012-07-15 — End: 2012-07-17
  Administered 2012-07-15 – 2012-07-17 (×8): 650 mg via ORAL
  Filled 2012-07-15 (×8): qty 2

## 2012-07-15 MED ORDER — SUMATRIPTAN 6 MG/0.5 ML SUBCUTANEOUS SOLUTION
6.0000 mg | Freq: Once | SUBCUTANEOUS | Status: AC
Start: 2012-07-15 — End: 2012-07-15
  Administered 2012-07-15: 6 mg via SUBCUTANEOUS
  Filled 2012-07-15 (×2): qty 0.5

## 2012-07-15 MED ORDER — SUMATRIPTAN 6 MG/0.5 ML SUBCUTANEOUS SOLUTION
6.00 mg | SUBCUTANEOUS | Status: AC
Start: 2012-07-15 — End: 2012-07-15
  Administered 2012-07-15: 6 mg via SUBCUTANEOUS
  Filled 2012-07-15: qty 0.5

## 2012-07-15 MED ORDER — SODIUM CHLORIDE 0.9 % INTRAVENOUS SOLUTION
INTRAVENOUS | Status: DC
Start: 2012-07-15 — End: 2012-07-17

## 2012-07-15 NOTE — Nurses Notes (Signed)
Paged service regarding order for Imitrex.  This seemed to help the best per patient.  Awaiting orders.

## 2012-07-15 NOTE — Care Plan (Addendum)
 Problem: General Plan of Care(Adult,OB)  Goal: Individualization/Patient Specific Goal(Adult/OB)  Outcome: Ongoing (see interventions/notes)  Patient has had no relief of Headache. Given Imitrex  and patient states this has helped  Service is aware.  Started on IV fluids due to patient not eating or drinking.  Neurology to consult tomorrow.

## 2012-07-15 NOTE — Progress Notes (Addendum)
CC Note    - patient complained of substernal chest pain with tingling in his arm so ordered serial troponins, CXR, and EKG      Venita Sheffield, MD 07/15/2012, 5:15 AM    Edsel Petrin, MD  Assistant Professor  Endocrinology & Metabolism  [place] [facility]

## 2012-07-15 NOTE — Nurses Notes (Addendum)
Patient states pain is 2-3 after administration of imitrex. Paged service regarding future orders of imitrex. Awaiting page back. Will monitor.    MD paged back stating she can't order another dose of imitrex tonight.

## 2012-07-15 NOTE — Nurses Notes (Signed)
Patient worried that his pain is going to get bad again when imitrex wears off. Paged MD. Awaiting page back. Will monitor.

## 2012-07-15 NOTE — Nurses Notes (Signed)
MD to see patient

## 2012-07-15 NOTE — Care Plan (Signed)
Problem: General Plan of Care(Adult,OB)  Goal: Plan of Care Review(Adult,OB)  The patient and/or their representative will communicate an understanding of their plan of care   Outcome: Ongoing (see interventions/notes)  Patient admitted for SOB and now has HA with blurry vision and is dizzy. Prn medications ordered for HA. Patient has chronic CP, had episode of CP and cardiac workup done at 0400. Patient monitored by tele. Instructed to call out when he needs to get OOB.

## 2012-07-15 NOTE — Progress Notes (Addendum)
 Med 5 Cross Cover Note    Pt tolerated sumatriptan  6 mg SC.  Requested second dose.  Per sign out, he may receive one more dose today if 1 hour after last dose given.  Order placed.    Wadie JINNY Fay Mickey, MD   [place]  [place]   Department of Internal Medicine - PGYI  07/15/2012 5:54 PM  Harlene Neth, MD  Assistant Professor  Endocrinology & Metabolism  [place] [facility]

## 2012-07-15 NOTE — Nurses Notes (Addendum)
Repaged service. Awaiting page back. Will monitor.    No new orders given.

## 2012-07-15 NOTE — Nurses Notes (Signed)
Paged service.  Patient states room is spinning, pain in his head is "excruiatiating".  Needs something for pain.  Awaiting orders.

## 2012-07-15 NOTE — Nurses Notes (Signed)
Patient called out c/o HA 9/10 and Chest pain 5/10 with numbness into left jaw and neck. He stated he awoke from sleeping because he had a weird dream. Vitals BP 131/76, HR 64 Sats 98% on RA, while resting in bed. Patient medicated with 30mg  IV Tordol. Paged Dr.Liu, orders for Stat chest x-ray, EKG and troponin. Patient refused to take prn nitro. Patient resting in bed, will continue to monitor.

## 2012-07-15 NOTE — Progress Notes (Signed)
 Medicine Progress Note    S:  Chest pain yesterday, now back to his baseline pain.  Headache is still very bad, most notable in occipital area, down neck.  Still sensitive to light.  Nauseous but no vomiting.  Not eating due to nausea.  Says his wife will bring in some military records of medications/shots he got when in the [employer] in [place].  He says he has some friends who got similar treatments who have experienced similar symptoms.    O:  Current Facility-Administered Medications   Medication   . famotidine  (PEPCID ) tablet   . NS premix infusion   . aspirin  chewable tablet 81 mg   . ketorolac  (TORADOL ) 30 mg/mL injection   . HYDROmorphone  (DILAUDID ) 1 mg/mL injection   . naproxen  (NAPROSYN ) tablet   . naproxen  (NAPROSYN ) tablet   . DISCONTD: metoclopramide  (REGLAN ) tablet   . nitroglycerin  (NITROSTAT ) sublingual tablet   . doxepin  (SINEQUAN ) capsule   . isosorbide  mononitrate (IMDUR ) 24 hr extended release tablet   . metoprolol  tartrate (LOPRESSOR ) tablet   . nitroglycerin  (NITROSTAT ) sublingual tablet   . rosuvastatin  (CRESTOR ) tablet   . metoclopramide  (REGLAN ) tablet   . enoxaparin  (LOVENOX ) 40 mg/0.4 mL SubQ injection   . NS flush syringe    And   . NS flush syringe   . gabapentin  (NEURONTIN ) capsule 400 mg       PE:    BP 110/78  Pulse 61  Temp 36.8 C (98.2 F)  Resp 18  Ht 1.778 m (5' 10)  Wt 98.9 kg (218 lb 0.6 oz)  BMI 31.28 kg/m2  SpO2 94%  General: pleasant, well-appearing, no acute distress, conversant   Psych: appropriate affect, A&Ox3, normal speech pattern  Skin: warm, no abnormal bruising  Eyes: EOMI, PER, non-injected conjunctiva   HENT: atraumatic, oral mucosa moist and pink, nl pinna  Neck: trachea midline, neck supple, can do chin-to-chest and lateral  Thyroid : no visible goiter  Lungs: nl respiratory effort   Musculoskeletal: nl-looking joints, moves all extrems  Neurological: no tremor, nl sensation     Labs:  Results for MURLE, OTTING (MRN [id number]) as of 07/15/2012  11:59   Ref. Range 07/15/2012 05:15 07/15/2012 09:41   TROPONIN-I Latest Range: <0.030 ng/mL <0.010 <0.010   Results for IVANN, TRIMARCO (MRN [id number]) as of 07/15/2012 11:59   Ref. Range 07/15/2012 00:52   WBC Latest Range: 3.5-11.0 THOU/uL 9.3   HGB Latest Range: 12.5-16.3 g/dL 86.8   HCT Latest Range: 36.7-47.0 % 37.3   PLATELET COUNT Latest Range: 140-450 THOU/uL 164   RBC Latest Range: 4.06-5.63 MIL/uL 4.28   MCV Latest Range: 78-100 fL 87.2   MCHC Latest Range: 32.5-35.8 g/dL 64.7   MCH Latest Range: 27.4-33.0 pg 30.7   RDW Latest Range: 12.0-15.0 % 13.2   MPV Latest Range: 7.5-11.5 fL 9.0   PMN'S No range found 52   LYMPHOCYTES No range found 38   EOSINOPHIL No range found 2   MONOCYTES No range found 8   BASOPHILS No range found 0   PMN ABS Latest Range: 2.0-6.5 THOU/uL 4.800   LYMPHS ABS Latest Range: 1.5-3.5 THOU/uL 3.500   EOS ABS Latest Range: 0.0-0.5 THOU/uL 0.200   MONOS ABS Latest Range: 0.3-1.0 THOU/uL 0.700   BASOS ABS Latest Range: 0.0-0.2 THOU/uL 0.000   SODIUM Latest Range: 136-145 mmol/L 137   POTASSIUM Latest Range: 3.5-5.1 mmol/L 3.8   CHLORIDE Latest Range: 96-111 mmol/L 106   CARBON DIOXIDE Latest Range: 23-33 mmol/L 25  BUN Latest Range: 8-26 mg/dL 7 (L)   CREATININE Latest Range: 0.62-1.27 mg/dL 9.08   GLUCOSE,NONFAST Latest Range: 65-139 mg/dL 93   ANION GAP Latest Range: 5-16 mmol/L 6   BUN/CREAT RATIO Latest Range: 6-22  8   ESTIMATED GLOMERULAR FILTRATION RATE Latest Range: >59 ml/min/1.33m2 >59   CALCIUM Latest Range: 8.5-10.4 mg/dL 8.9   MAGNESIUM  Latest Range: 1.7-2.5 mg/dL 1.8   PHOSPHORUS Latest Range: 2.4-4.7 mg/dL 4.4     1/82/86  CT brain:  FINDINGS: There is no evidence of acute infarction, hemorrhage, or abnormal space occupying mass. The gray-white matter differentiation is preserved. The ventricles are normal in appearance. The visualized portions of skull and scalp are normal. The paranasal sinuses are partially imaged and demonstrate normal development and aeration.  Partial imaging of the orbits shows symmetry.   IMPRESSION:   No evidence for acute intracranial abnormality.       A/P  Chest pain:  At baseline.  Will arrange f/u with Dr. Martell as outpatient.  Headache:  Worse.  Not his typical migraine.  CT unremarkable.  Will order sumatriptan  x1 with repeat allowed at 1 hour if not relief.  Reglan  and benadryl  to follow, if triptan not helpful.  If symptoms persist, will order CTA and neurology consult tomorrow.  Nausea:  Most likely due to migraine.  Encouraged small sips.  Will consider ivf's to help maintain hydration.    I saw the patient with the resident, Dr. Cinderella, and discussed care with him.    Harlene Neth, MD  Assistant Professor  Endocrinology & Metabolism  [place] [facility]

## 2012-07-15 NOTE — Nurses Notes (Signed)
Service paged - pt feeling nauseated, not eating/drinking.  Question possible IV fluids.  Awaiting orders.

## 2012-07-16 ENCOUNTER — Observation Stay (HOSPITAL_BASED_OUTPATIENT_CLINIC_OR_DEPARTMENT_OTHER)

## 2012-07-16 LAB — CBC/DIFF
BASOPHILS: 0 %
BASOS ABS: 0 THOU/uL (ref 0.0–0.2)
EOS ABS: 0.2 10*3/uL (ref 0.0–0.5)
EOSINOPHIL: 2 %
HCT: 37.4 % (ref 36.7–47.0)
HGB: 13.1 g/dL (ref 12.5–16.3)
LYMPHOCYTES: 42 %
LYMPHS ABS: 3.2 10*3/uL (ref 1.5–3.5)
MCH: 30 pg (ref 27.4–33.0)
MCHC: 34.9 g/dL (ref 32.5–35.8)
MCV: 86 fL (ref 78–100)
MONOCYTES: 8 %
MONOS ABS: 0.6 10*3/uL (ref 0.3–1.0)
MPV: 9 fL (ref 7.5–11.5)
PLATELET COUNT: 163 THOU/uL (ref 140–450)
PMN ABS: 3.6 THOU/uL (ref 2.0–6.5)
PMN'S: 48 %
RBC: 4.35 MIL/uL (ref 4.06–5.63)
RDW: 13.1 % (ref 12.0–15.0)
WBC: 7.7 10*3/uL (ref 3.5–11.0)

## 2012-07-16 LAB — BASIC METABOLIC PANEL
ANION GAP: 10 mmol/L (ref 5–16)
BUN/CREAT RATIO: 10 (ref 6–22)
BUN: 10 mg/dL (ref 8–26)
CALCIUM: 8.5 mg/dL (ref 8.5–10.4)
CARBON DIOXIDE: 25 mmol/L (ref 23–33)
CHLORIDE: 105 mmol/L (ref 96–111)
CREATININE: 0.99 mg/dL (ref 0.62–1.27)
ESTIMATED GLOMERULAR FILTRATION RATE: >59 ml/min/1.73m2 (ref >59)
GLUCOSE,NONFAST: 91 mg/dL (ref 65–139)
POTASSIUM: 4 mmol/L (ref 3.5–5.1)
SODIUM: 140 mmol/L (ref 136–145)

## 2012-07-16 LAB — MAGNESIUM: MAGNESIUM: 1.8 mg/dL (ref 1.7–2.5)

## 2012-07-16 LAB — PERFORM POC WHOLE BLOOD GLUCOSE
GLUCOSE, POINT OF CARE: 112 mg/dL — ABNORMAL HIGH (ref 70–105)
GLUCOSE, POINT OF CARE: 88 mg/dL (ref 70–105)
GLUCOSE, POINT OF CARE: 78 mg/dL (ref 70–105)
GLUCOSE, POINT OF CARE: 98 mg/dL (ref 70–105)

## 2012-07-16 LAB — PHOSPHORUS: PHOSPHORUS: 3.8 mg/dL (ref 2.4–4.7)

## 2012-07-16 MED ORDER — IOPAMIDOL 370 MG IODINE/ML (76 %) INTRAVENOUS SOLUTION
INTRAVENOUS | Status: AC
Start: 2012-07-16 — End: 2012-07-16
  Filled 2012-07-16: qty 50

## 2012-07-16 MED ORDER — FAMOTIDINE 20 MG TABLET
20.00 mg | ORAL_TABLET | Freq: Two times a day (BID) | ORAL | Status: DC
Start: 2012-07-16 — End: 2012-07-17
  Administered 2012-07-16 – 2012-07-17 (×2): 20 mg via ORAL
  Filled 2012-07-16 (×3): qty 1

## 2012-07-16 MED ORDER — IOVERSOL 350 MG IODINE/ML INTRAVENOUS SOLUTION
140.00 mL | INTRAVENOUS | Status: AC
Start: 2012-07-16 — End: 2012-07-16
  Administered 2012-07-16: 140 mL via INTRAVENOUS

## 2012-07-16 MED ORDER — TIZANIDINE 2 MG TABLET
2.00 mg | ORAL_TABLET | Freq: Three times a day (TID) | ORAL | Status: DC
Start: 2012-07-16 — End: 2012-07-17
  Administered 2012-07-16 – 2012-07-17 (×3): 2 mg via ORAL
  Filled 2012-07-16 (×5): qty 1

## 2012-07-16 MED ORDER — ONDANSETRON HCL (PF) 4 MG/2 ML INJECTION SOLUTION
INTRAMUSCULAR | Status: DC
Start: 2012-07-16 — End: 2012-07-17
  Filled 2012-07-16: qty 2

## 2012-07-16 MED ORDER — DIPHENHYDRAMINE 50 MG/ML INJECTION SOLUTION
INTRAMUSCULAR | Status: AC
Start: 2012-07-16 — End: 2012-07-17
  Filled 2012-07-16: qty 1

## 2012-07-16 MED ORDER — ONDANSETRON HCL (PF) 4 MG/2 ML INJECTION SOLUTION
4.00 mg | INTRAMUSCULAR | Status: AC
Start: 2012-07-16 — End: 2012-07-16
  Administered 2012-07-16: 4 mg via INTRAVENOUS

## 2012-07-16 MED ORDER — DIPHENHYDRAMINE 50 MG/ML INJECTION SOLUTION
12.5000 mg | Freq: Three times a day (TID) | INTRAMUSCULAR | Status: DC
Start: 2012-07-16 — End: 2012-07-16

## 2012-07-16 MED ORDER — METOCLOPRAMIDE 5 MG/ML INJECTION SOLUTION
10.0000 mg | Freq: Three times a day (TID) | INTRAMUSCULAR | Status: DC
Start: 2012-07-16 — End: 2012-07-17
  Administered 2012-07-16 – 2012-07-17 (×5): 10 mg via INTRAVENOUS
  Filled 2012-07-16 (×7): qty 2

## 2012-07-16 MED ORDER — DIPHENHYDRAMINE 50 MG/ML INJECTION SOLUTION
12.50 mg | Freq: Three times a day (TID) | INTRAMUSCULAR | Status: DC
Start: 2012-07-16 — End: 2012-07-17
  Administered 2012-07-16 – 2012-07-17 (×5): 12.5 mg via INTRAVENOUS
  Filled 2012-07-16 (×4): qty 1

## 2012-07-16 NOTE — Nurses Notes (Signed)
Paged service - pt would like to talk with service.

## 2012-07-16 NOTE — Consults (Addendum)
[facility]  Neurology Initial Consult      Spencer [name],Spencer [name], 46 y.o. male  Date of Admission:  07/13/2012  Date of service: 07/16/2012  Date of Birth:  1966-10-01    PCP: [name], DO  Consult Requested By: Dr Rubye Oaks    Information obtained from: patient  Chief Complaint:  headache    [id number] Tayveon Lombardo is a 46 y.o. white male with history of migraine headaches was admitted with chest pain and bilateral occipital headache. Headache started about a week ago has been persistant is severe in intensity and is pressure like. Patient typical migraine is bilateral frontal throbbing pain. He had an episode of migraine superimposed on the current headache yesterday and was relieved with sumatriptan. However pressure on bilateral occipital region is persistent. This is associated with nausea. Patient has photophobia, no relieving factors      Past Medical History   Diagnosis Date   . Colitis    . 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 Migraines    . Reflux    . Chest pain 12/23/2007   . Borderline diabetes mellitus 12/23/2007   . Spells 06/16/2008   . Stress test 05/28/2010   . S/P cardiac catheterization 05/28/2010   . PAD (peripheral artery disease) 06/03/2010   . Hypertriglyceridemia 06/03/2010   . Anginal pain 06/03/2010   . CAD (coronary artery disease) 06/03/2010   . Crohn's disease 06/14/2010   . IBS (irritable bowel syndrome) 06/14/2010   . GERD (gastroesophageal reflux disease) 06/14/2010   . Hyperlipidemia 12/26/2007     denies cp sob or mi hx   . Post traumatic stress disorder (PTSD)      currently being evaluated   . Arthritis      spine   . HTN    . 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 BPH (benign prostatic hyperplasia) 07/15/2011   . CVA (cerebrovascular accident) 12/2007     Past Surgical History   Procedure Date   . Pb colonoscopy,biopsy    . Hx laparotomy    . Hx heart catheterization    . Colonoscopy 09/24/2010     COLONOSCOPY performed by [name] at [facility] OR ENDO      Medications Prior to Admission     Medication    SUMATRIPTAN SUCC/NAPROXEN SOD (TREXIMET ORAL)    Take by mouth    isosorbide mononitrate (IMDUR) 30 mg Oral Tablet Sustained Release 24 hr    Take 1 Tab (30 mg total) by mouth Once a day    nitroglycerin (NITROSTAT) 0.4 mg Sublingual Tablet, Sublingual    for 3 doses over 15 minutes    Hyoscyamine Sulfate (ANASPAZ, LEVSIN) 0.125 mg Oral Tablet    take 1 Tab by mouth Every 6 hours as needed. Please give sublingual form    DOXEPIN HCL (DOXEPIN ORAL)    Take 25 mg by mouth Once per day as needed     metoprolol (LOPRESSOR) 25 mg Oral Tablet    take 1 Tab by mouth Twice daily.    rosuvastatin (CRESTOR) 20 mg Oral Tablet    take 1 Tab by mouth QPM.    Gabapentin (NEURONTIN) 400 mg Oral Capsule    take 1 Cap by mouth every night.    aspirin 325 mg Tab    take 325 mg by mouth Once a day.        Allergies   Allergen Reactions   . Adhesive  Red rash     History   Substance Use Topics   . Smoking status: Never Smoker    . Smokeless tobacco: Never Used   . Alcohol Use: No      rarely     Family History   Problem Relation Age of Onset   . Hypertension Mother    . Hypertension Brother    . Diabetes Brother    . Hypertension Brother        ROS: Other than ROS in the HPI, all other systems were negative.    Exam:  Temperature: 36.5 C (97.7 F)  Heart Rate: 63   BP (Non-Invasive): 112/61 mmHg  Respiratory Rate: 17   SpO2-1: 98 %  Pain Score (Numeric, Faces): 10  General: mild distress  HENT:Head atraumatic and normocephalic  Neck: No JVD and no hypertrophy of muscles however tenderness on left sided neck muscles  Carotids:Carotids normal without bruit  Lungs: Clear to auscultation bilaterally.   Cardiovascular: regular rate and rhythm  Abdomen: Soft, non-tender  Extremities: No cyanosis or edema  Ophthalomscopic: normal w/o hemorrhages, exudates, or papilledema  Mental status:  Level of Consciousness: alert  Orientations: Alert and oriented x 3   MemoryRegistration, Recall, and Following of commands is normal  AttentionsAttention and Concentration are normal  Knowledge: Good  Language: Normal  Speech: Normal  Cranial nerves:   CN2: Visual acuity and fields intact  CN 3,4,6: EOMI, PERRLA  CN 5Facial sensation intact  CN 7Face symmetrical  CN 8: Hearing grossly intact  CN 9,10: Palate symmetric and gag normal  CN 11: Sternocleidomastoid and Trapezius have normal strength.  CN 12: Tongue normal with no fasiculations or deviation  Gait, Coordination, and Reflexes:   Gait: Normal and Unable to ambulate  Coordination: Coordination is normal without tremor              Sensory: Sensory exam in the upper and lower extremities is normal    Muscle exam  Arm Right Left Leg Right Left   Deltoid 5/5 5/5 Iliopsoas 5/5 5/5   Biceps 5/5 5/5 Quads 5/5 5/5   Triceps 5/5 5/5 Hamstrings 5/5 5/5   Wrist Extension 5/5 5/5 Ankle Dorsi Flexion 5/5 5/5   Wrist Flexion 5/5 5/5 Ankle Plantar Flexion 5/5 5/5   Interossei 5/5 5/5 Ankle Eversion 5/5 5/5   APB 5/5 5/5 Ankle Inversion 5/5 5/5       Reflexes   RJ BJ TJ KJ AJ Plantars Hoffman's   Right 2+ 2+ 2+ 2+ 2+ Downgoing Not present   Left 2+ 2+ 2+ 2+ 2+ Downgoing Not present     Diabetes:  Patient not a diabetic.    Labs:  Lab Results for Last 24 Hours:    Results for orders placed during the hospital encounter of 07/13/12 (from the past 24 hour(s))   POCT WHOLE BLOOD GLUCOSE       Component Value Range    GLUCOSE, POINT OF CARE 112 (*) 70 - 105 mg/dL   CBC/DIFF       Component Value Range    WBC 7.7  3.5 - 11.0 THOU/uL    RBC 4.35  4.06 - 5.63 MIL/uL    HGB 13.1  12.5 - 16.3 g/dL    HCT 95.6  21.3 - 08.6 %    MCV 86.0  78 - 100 fL    MCH 30.0  27.4 - 33.0 pg    MCHC 34.9  32.5 - 35.8 g/dL    RDW 13.1  12.0 - 15.0 %    PLATELET COUNT 163  140 - 450 THOU/uL    MPV 9.0  7.5 - 11.5 fL    PMN'S 48      PMN ABS 3.600  2.0 - 6.5 THOU/uL    LYMPHOCYTES 42      LYMPHS ABS 3.200  1.5 - 3.5 THOU/uL    MONOCYTES 8       MONOS ABS 0.600  0.3 - 1.0 THOU/uL    EOSINOPHIL 2      EOS ABS 0.200  0.0 - 0.5 THOU/uL    BASOPHILS 0      BASOS ABS 0.000  0.0 - 0.2 THOU/uL   BASIC METABOLIC PANEL, NON-FASTING       Component Value Range    SODIUM 140  136 - 145 mmol/L    POTASSIUM 4.0  3.5 - 5.1 mmol/L    CHLORIDE 105  96 - 111 mmol/L    CARBON DIOXIDE 25  23 - 33 mmol/L    ANION GAP 10  5 - 16 mmol/L    CREATININE 0.99  0.62 - 1.27 mg/dL    ESTIMATED GLOMERULAR FILTRATION RATE >59  >59 ml/min/1.74m2    GLUCOSE,NONFAST 91  65 - 139 mg/dL    BUN 10  8 - 26 mg/dL    BUN/CREAT RATIO 10  6 - 22    CALCIUM 8.5  8.5 - 10.4 mg/dL   MAGNESIUM       Component Value Range    MAGNESIUM 1.8  1.7 - 2.5 mg/dL   PHOSPHORUS       Component Value Range    PHOSPHORUS 3.8  2.4 - 4.7 mg/dL   POCT WHOLE BLOOD GLUCOSE       Component Value Range    GLUCOSE, POINT OF CARE 88  70 - 105 mg/dL       Review of reports and notes reveal:     Independent Interpretation of images or specimens:  CT brain normal    Impressions/Recommendations:   Occipital headache with muscle tenderness over posterior neck and worsening on turning head to left  Migraine headaches with superimposed muscle spasm  -Will recommend zanaflex 2 mg TID    Cyndy Freeze, MD  PGY II  Internal Medicine      Cyndy Freeze, MD 07/16/2012, 1:37 PM      I saw and examined the patient.  I reviewed the resident's note.  I agree with the findings and plan of care as documented in the resident's note.  Any exceptions/additions are edited/noted.    Berlin Hun, MD 07/16/2012, 8:01 PM

## 2012-07-16 NOTE — Progress Notes (Addendum)
 [place]  [facility]  Medicine Progress Note    Garner Dullea Schreifels  Date of service: 07/16/2012  Date of Admission:  07/13/2012    Hospital Day:  LOS: 3 days     Subjective: I saw and examined patient at his bedside this am. Overnight patient reports headaches keeping him awake at night. He says they start in occipital region and radiate forward on both sides and top of head, he says they pulsate, and reports photophobia and phonophobia. Patient says Sumatriptin SC yesterday provided relief for about 2.5 hours. Patient currently on Reglan  and Benadryl  - he says this does not provide relief and only makes him tired. Patient also reports night sweats. Patient denies any continued chest pain or SOB. Consult with Neurology is pending.       Vital Signs:  Filed Vitals:    07/16/12 0222 07/16/12 0552 07/16/12 0830 07/16/12 0931   BP: 121/64 106/61  126/80   Pulse: 68 74 57 56   Temp: 37 C (98.6 F) 37.1 C (98.8 F)  36.7 C (98.1 F)   Resp: 18 18  18    Height:       Weight:       SpO2:   98%        Temp (24hrs) Max:37.1 C (98.8 F)      Systolic (24hrs), Avg:116 mmHg, Min:106 mmHg, Max:126 mmHg    Diastolic (24hrs), Avg:67 mmHg, Min:61 mmHg, Max:80 mmHg    Temp  Avg: 36.8 C (98.3 F)  Min: 36.4 C (97.6 F)  Max: 37.1 C (98.8 F)  Pulse  Avg: 63.4   Min: 56   Max: 74   Resp  Avg: 18.3   Min: 18   Max: 20   SpO2  Avg: 98 %  Min: 98 %  Max: 98 %  Pain Score (Numeric, Faces): 10  Fi02    I/O:  I/O last 24 hours:      Intake/Output Summary (Last 24 hours) at 07/16/12 1254  Last data filed at 07/16/12 1200   Gross per 24 hour   Intake   2280 ml   Output      0 ml   Net   2280 ml     I/O current shift:  08/19 0800 - 08/19 1559  In: 450 [I.V.:450]  Out: -     Current Facility-Administered Medications:  metoclopramide  (REGLAN ) 5 mg/mL injection 10 mg Intravenous 3x/day AC   diphenhydrAMINE  (BENADRYL ) injection ---Cabinet Override      diphenhydrAMINE  (BENADRYL ) 50 mg/mL injection 12.5 mg Intravenous Q8HRS      ondansetron  (ZOFRAN ) injection ---Cabinet Override      famotidine  (PEPCID ) tablet 20 mg Oral 2x/day   NS premix infusion  Intravenous Continuous   acetaminophen  (TYLENOL ) tablet 650 mg Oral Q6HRS   aspirin  chewable tablet 81 mg 81 mg Oral Daily   ketorolac  (TORADOL ) 30 mg/mL injection 30 mg Intravenous Q6H PRN   nitroglycerin  (NITROSTAT ) sublingual tablet 0.4 mg Sublingual Q5 Min PRN   doxepin  (SINEQUAN ) capsule 10 mg Oral Dialysis x1 PRN   isosorbide  mononitrate (IMDUR ) 24 hr extended release tablet 30 mg Oral Daily   metoprolol  tartrate (LOPRESSOR ) tablet 25 mg Oral 2x/day   nitroglycerin  (NITROSTAT ) sublingual tablet 0.4 mg Sublingual Q5 Min PRN   rosuvastatin  (CRESTOR ) tablet 20 mg Oral QPM   enoxaparin  (LOVENOX ) 40 mg/0.4 mL SubQ injection 40 mg Subcutaneous Q24H   NS flush syringe 2 mL Intracatheter Q8HRS   And      NS flush  syringe 2-6 mL Intracatheter Q1 MIN PRN   gabapentin  (NEURONTIN ) capsule 400 mg 400 mg Oral NIGHTLY       Allergies   Allergen Reactions   . Adhesive      Red rash       Physical Exam:  General: appears in good health, appears stated age and no distress  Eyes: Conjunctiva clear., abnormal findings: pupils constricted/pinpoint more than expected for dark room  HENT:Head atraumatic and normocephalic  Neck: no adenopathy  Lungs: Clear to auscultation bilaterally.   Cardiovascular: regular rate and rhythm  no murmurs  Abdomen: Soft, non-tender, Non-tender, non-distended, scar from laparoscopic surgery above navel   Extremities: No cyanosis or edema    Labs:  Recent Results (from the past 24 hour(s))   CBC/DIFF       Component Value Range    WBC 7.7  3.5 - 11.0 THOU/uL    RBC 4.35  4.06 - 5.63 MIL/uL    HGB 13.1  12.5 - 16.3 g/dL    HCT 62.5  63.2 - 52.9 %    MCV 86.0  78 - 100 fL    MCH 30.0  27.4 - 33.0 pg    MCHC 34.9  32.5 - 35.8 g/dL    RDW 86.8  87.9 - 84.9 %    PLATELET COUNT 163  140 - 450 THOU/uL    MPV 9.0  7.5 - 11.5 fL    PMN'S 48      PMN ABS 3.600  2.0 - 6.5 THOU/uL     LYMPHOCYTES 42      LYMPHS ABS 3.200  1.5 - 3.5 THOU/uL    MONOCYTES 8      MONOS ABS 0.600  0.3 - 1.0 THOU/uL    EOSINOPHIL 2      EOS ABS 0.200  0.0 - 0.5 THOU/uL    BASOPHILS 0      BASOS ABS 0.000  0.0 - 0.2 THOU/uL   BASIC METABOLIC PANEL, NON-FASTING       Component Value Range    SODIUM 140  136 - 145 mmol/L    POTASSIUM 4.0  3.5 - 5.1 mmol/L    CHLORIDE 105  96 - 111 mmol/L    CARBON DIOXIDE 25  23 - 33 mmol/L    ANION GAP 10  5 - 16 mmol/L    CREATININE 0.99  0.62 - 1.27 mg/dL    ESTIMATED GLOMERULAR FILTRATION RATE >59  >59 ml/min/1.31m2    GLUCOSE,NONFAST 91  65 - 139 mg/dL    BUN 10  8 - 26 mg/dL    BUN/CREAT RATIO 10  6 - 22    CALCIUM 8.5  8.5 - 10.4 mg/dL   MAGNESIUM        Component Value Range    MAGNESIUM  1.8  1.7 - 2.5 mg/dL   PHOSPHORUS       Component Value Range    PHOSPHORUS 3.8  2.4 - 4.7 mg/dL     Radiology:  CXR no acute cardiopulmonary process.     CT 8/17:  FINDINGS: There is no evidence of acute infarction, hemorrhage, or abnormal space occupying mass. The gray-white matter differentiation is preserved. The ventricles are normal in appearance. The visualized portions of skull and scalp are normal. The paranasal sinuses are partially imaged and demonstrate normal development and aeration. Partial imaging of the orbits shows symmetry.    PT/OT: No    Consults: Neurology    Hardware (lines, foley's, tubes): PIV, telemetry  Assessment/ Plan:   Active Hospital Problems    Diagnosis   . Primary Problem: Anginal pain   . Hypertriglyceridemia   . Hyperlipidemia   . Borderline diabetes mellitus     Migraine headaches  - provided SC Imitrex  2 doses over weekend with some relief, patient says wore off after 2.5h  - currently on Reglan  and Benadryl  but patient says pain not controlled   - on Toradol  for pain   - 8/17 CT, no abnormal findings  - consulted Neurology    Non cardiac Chest Pain   - Troponins negative x3   - EKG shows new 1st degree AV block   - Nitroglycerin  .4 mg prn   - Unlikely that  this is a new infarct. Will monitor patient.   - Negative cardiac cath/ MPS in 2009.   - Continue metoprolol    - Continue IMDUR  30 mg daily   - Aspirin  81 daily   - Continue crestor  20 mg.     HTN   - Metoprolol      Hypertryglyceridemia  - lipid panel, chol 167, HDL 32, LDL 94, TRIGLYCERIDES 205, VLDL 41  - Crestor  20mg       DVT/PE Prophylaxis: Lovenox     Disposition Planning: Home discharge      Eveleen JAYSON Bars, MD 07/16/2012, 12:55 PM      I performed a history and physical exam of the patient and discussed patient's management with the resident.  I reviewed the resident's note and agree with the resident's findings and plan.  Any emendations to the above note were made directly in the note.  Harlene Neth, MD  Assistant Professor  Endocrinology & Metabolism  [facility]

## 2012-07-16 NOTE — Nurses Notes (Signed)
Patient c/o 7/10. Notified service. Orders placed for benadryl and reglan. Instructed to administer. Will administer dose now and continue to monitor.

## 2012-07-16 NOTE — Nurses Notes (Signed)
 Patient rating pain 8/10. Notified MD. No orders obtained due to amount of medications given throughout the night. Instructed to wait for day team to round. Will monitor.

## 2012-07-16 NOTE — Care Plan (Signed)
 Problem: General Plan of Care(Adult,OB)  Goal: Plan of Care Review(Adult,OB)  The patient and/or their representative will communicate an understanding of their plan of care   Outcome: Ongoing (see interventions/notes)  Patient's pain being managed with prn pain medications. Patient still experiencing headaches with relief from imitrex . Patient awaiting neuro consult.

## 2012-07-16 NOTE — Care Plan (Signed)
Problem: General Plan of Care(Adult,OB)  Goal: Individualization/Patient Specific Goal(Adult/OB)  Outcome: Ongoing (see interventions/notes)  Pt continues to complain of head pain 9/10.  Service is aware.  Prescribed new medication - Zanaflex.  Pt ordered Ct of angio intra extra cranial.  Appetite has been poor due to pain.  Continues on IV fluids for hydration.  Visitors have been at bedside.  Continue to monitor.  Call bell at bedside and instructed pt to call for assistance.

## 2012-07-16 NOTE — Nurses Notes (Signed)
Paged service regarding pain in head - 10/10 and feeling nauseated.  Awaiting orders.

## 2012-07-16 NOTE — Progress Notes (Addendum)
 CC Note    - patient already got two doses of Sumatriptan  which he believed helped his headaches a great deal  - patient had headache again at 12:30 AM 8/19  - so I stopped her PO reglan  and started IV reglan  10 mg TID and IV benadryl  12.5 mg TID    Ronnald Hermann Blake, MD 07/16/2012, 12:31 AM

## 2012-07-16 NOTE — Care Management Notes (Signed)
[facility]  Care Management Initial Evaluation    Patient Name: Spencer [name]  Date of Birth: 03/24/1966  Sex: male  Date/Time of Admission: 07/13/2012  7:46 PM  Room/Bed: 718/A  Payor: BLUE CROSS BLUE SHIELD  Plan: FEDERAL BLUE CROSS  Product Type: PPO      Spencer [name] is a 46 y.o., male, admitted for chest pain/ migraines    Height/Weight: 177.8 cm (5\' 10" ) / 98.9 kg (218 lb 0.6 oz)    Subjective/Objective: met with pt at bedside to discuss potential needs     LOS: 3 days   Admitting Diagnosis: CHEST PAIN  Assessment:    07/16/12 1349   Assessment Detail   Assessment Type Admission   Date of Care Management Update 07/16/12   Date of Next DCP Update 07/18/12   Care Management Plan   Discharge Planning Status initial meeting   Projected Discharge Date 07/18/12   Anticipated Discharge Disposition home with assist   CM will evaluate for rehabilitation potential yes   Patient/Family In Agreement With Plan yes   Discharge Needs Assessment   Patient has PCP/Pediatrician?  Yes       Name of Provider Spencer [name]   Equipment Currently Used At Home none   Discharge Facility/Level of Care Needs Home (Patient/Family Member/other)(code 1)   Transportation Available family or friend will provide;car   Referral Information   Admission Type observation   Arrived From emergency department   Address Verified verified-no changes   Insurance Verified verified-no change   Source of Information Patient   Contact Information   Name Spencer [name] wife   Phone [phone]   Employment/Financial   Patient has Prescription Coverage?  Yes       Pharmacy Name [facility]       Name of Insurance Coverage for Medications BCBS   Living Environment   Lives With spouse   Living Arrangements house   Able to Return to Prior Living Arrangements yes       Clinical: - provided SC Imitrex 2 doses over weekend with some relief, patient says wore off after 2.5h - currently on Reglan and Benadryl but patient says pain not controlled - on Toradol for pain - 8/17 CT, no abnormal findings - consulted Neurology - Troponins negative x3 - EKG shows new 1st degree AV block - Nitroglycerin .4 mg prn - Unlikely that this is a new infarct. Will monitor patient. - Negative cardiac cath/ MPS in 2009. - Continue metoprolol - Continue IMDUR 30 mg daily - Aspirin 81 daily - Continue crestor 20 mg  Pt lives with wife, no DME, has script coverage through [employer]. Has transportation home. Pt was independent for all care    Discharge Plan:  Home(Patient/Family Member/other) (code 1)  Pt to be d/c to home with no needs    The patient will continue to be evaluated for developing discharge needs.     Case Manager: Ellan Lambert, RN 07/16/2012, 2:30 PM  Phone: [phone]

## 2012-07-17 LAB — COMPREHENSIVE METABOLIC PANEL, NON-FASTING
ALBUMIN: 3.2 g/dL — ABNORMAL LOW (ref 3.5–4.8)
ALKALINE PHOSPHATASE: 38 U/L (ref 38–126)
ALT (SGPT): 18 U/L (ref 7–55)
ANION GAP: 7 mmol/L (ref 5–16)
AST (SGOT): 18 U/L (ref 8–48)
BILIRUBIN, TOTAL: 1.9 mg/dL — ABNORMAL HIGH (ref 0.3–1.3)
BUN/CREAT RATIO: 6 (ref 6–22)
BUN: 6 mg/dL — ABNORMAL LOW (ref 8–26)
CALCIUM: 8.6 mg/dL (ref 8.5–10.4)
CARBON DIOXIDE: 26 mmol/L (ref 23–33)
CHLORIDE: 105 mmol/L (ref 96–111)
CREATININE: 1.07 mg/dL (ref 0.62–1.27)
ESTIMATED GLOMERULAR FILTRATION RATE: >59 mL/min/{1.73_m2} (ref >59)
GLUCOSE,NONFAST: 114 mg/dL (ref 65–139)
POTASSIUM: 3.8 mmol/L (ref 3.5–5.1)
SODIUM: 138 mmol/L (ref 136–145)
TOTAL PROTEIN: 6.5 g/dL (ref 6.4–8.3)

## 2012-07-17 LAB — CBC
HCT: 37.5 % (ref 36.7–47.0)
HGB: 13.2 g/dL (ref 12.5–16.3)
MCH: 30.4 pg (ref 27.4–33.0)
MCHC: 35.3 g/dL (ref 32.5–35.8)
MCV: 86 fL (ref 78–100)
PLATELET COUNT: 171 THOU/uL (ref 140–450)
RBC: 4.35 MIL/uL (ref 4.06–5.63)
RDW: 13 % (ref 12.0–15.0)
WBC: 6.9 10*3/uL (ref 3.5–11.0)

## 2012-07-17 LAB — PERFORM POC WHOLE BLOOD GLUCOSE: GLUCOSE, POINT OF CARE: 101 mg/dL (ref 70–105)

## 2012-07-17 MED ORDER — ACETAMINOPHEN 650 MG TABLET
650.00 mg | ORAL_TABLET | Freq: Four times a day (QID) | ORAL | Status: DC
Start: 2012-07-17 — End: 2012-07-17

## 2012-07-17 MED ORDER — TIZANIDINE 2 MG TABLET
2.00 mg | ORAL_TABLET | Freq: Three times a day (TID) | ORAL | Status: DC | PRN
Start: 2012-07-17 — End: 2012-07-17

## 2012-07-17 MED ORDER — TIZANIDINE 2 MG TABLET
2.00 mg | ORAL_TABLET | Freq: Three times a day (TID) | ORAL | Status: AC | PRN
Start: 2012-07-17 — End: 2012-07-31

## 2012-07-17 MED ORDER — ACETAMINOPHEN 650 MG TABLET
650.00 mg | ORAL_TABLET | Freq: Four times a day (QID) | ORAL | Status: AC
Start: 2012-07-17 — End: 2012-08-16

## 2012-07-17 NOTE — Care Plan (Signed)
Problem: General Plan of Care(Adult,OB)  Goal: Plan of Care Review(Adult,OB)  The patient and/or their representative will communicate an understanding of their plan of care   Outcome: Ongoing (see interventions/notes)  Pt stating headache much better. Pt ambulating in room. Pt showered. Pt pain controlled at this tim. Pt continue to take zanaflex. Possible d/c today.

## 2012-07-17 NOTE — Nurses Notes (Signed)
Pt discharged to home with family. Iv taken out with catheter tip intact. Pt has all belongings. Pt ambulatory, eating, and voiding.pt aware to follow up with Dr. Myer Peer in cardiology on 08/07/12 at 7:45 am and to follow up with his PCP within 1 week.. Pt is given discharge AVS with all discharge instructions. Pt given discharge prescriptions of tylenol and zanaflex. Pt has no questions or concerns at this time.

## 2012-07-17 NOTE — Care Plan (Signed)
 Problem: General Plan of Care(Adult,OB)  Goal: Plan of Care Review(Adult,OB)  The patient and/or their representative will communicate an understanding of their plan of care   Outcome: Ongoing (see interventions/notes)  Patient received ct last evening and discharge is pending those results.  Pain levels being controlled with prn pain medications. Patient ambulates in room. POC reviewed with patient.

## 2012-07-17 NOTE — Progress Notes (Addendum)
 [place]  [facility]  Medicine Progress Note    Spencer [name]  Date of service: 07/17/2012  Date of Admission:  07/13/2012    Hospital Day:  LOS: 4 days     Subjective: I saw and examined patient at his bedside this am. Patient says his occipital HA/pain is basically gone. He says it's still a little sore. He mentions that he felt a flutter/palpitation last night but thinks it was due to a bad dream where he was running from something. Patient denies SOB. Patient denies chest pain.       Vital Signs:  Filed Vitals:    07/17/12 0008 07/17/12 0128 07/17/12 0412 07/17/12 0559   BP:  98/55  121/74   Pulse:  48  48   Temp:  36.8 C (98.2 F)  36.6 C (97.9 F)   Resp:  18  18   Height:       Weight:       SpO2: 97%  97%        Temp (24hrs) Max:36.8 C (98.2 F)      Systolic (24hrs), Avg:110 mmHg, Min:98 mmHg, Max:126 mmHg    Diastolic (24hrs), Avg:65 mmHg, Min:55 mmHg, Max:80 mmHg    Temp  Avg: 36.7 C (98 F)  Min: 36.5 C (97.7 F)  Max: 36.8 C (98.2 F)  Pulse  Avg: 54.2   Min: 48   Max: 63   Resp  Avg: 18.2   Min: 17   Max: 20   SpO2  Avg: 97.3 %  Min: 97 %  Max: 98 %  Pain Score (Numeric, Faces): 2  Fi02    I/O:  I/O last 24 hours:      Intake/Output Summary (Last 24 hours) at 07/17/12 0909  Last data filed at 07/17/12 0700   Gross per 24 hour   Intake   2370 ml   Output      0 ml   Net   2370 ml     I/O current shift:       Current Facility-Administered Medications:  metoclopramide  (REGLAN ) 5 mg/mL injection 10 mg Intravenous 3x/day AC   diphenhydrAMINE  (BENADRYL ) 50 mg/mL injection 12.5 mg Intravenous Q8HRS   ondansetron  (ZOFRAN ) injection ---Cabinet Override      tiZANidine  (ZANAFLEX ) tablet 2 mg Oral 3x/day   famotidine  (PEPCID ) tablet 20 mg Oral 2x/day   NS premix infusion  Intravenous Continuous   acetaminophen  (TYLENOL ) tablet 650 mg Oral Q6HRS   aspirin  chewable tablet 81 mg 81 mg Oral Daily   ketorolac  (TORADOL ) 30 mg/mL injection 30 mg Intravenous Q6H PRN   nitroglycerin  (NITROSTAT )  sublingual tablet 0.4 mg Sublingual Q5 Min PRN   doxepin  (SINEQUAN ) capsule 10 mg Oral Dialysis x1 PRN   isosorbide  mononitrate (IMDUR ) 24 hr extended release tablet 30 mg Oral Daily   metoprolol  tartrate (LOPRESSOR ) tablet 25 mg Oral 2x/day   nitroglycerin  (NITROSTAT ) sublingual tablet 0.4 mg Sublingual Q5 Min PRN   rosuvastatin  (CRESTOR ) tablet 20 mg Oral QPM   enoxaparin  (LOVENOX ) 40 mg/0.4 mL SubQ injection 40 mg Subcutaneous Q24H   NS flush syringe 2 mL Intracatheter Q8HRS   And      NS flush syringe 2-6 mL Intracatheter Q1 MIN PRN   gabapentin  (NEURONTIN ) capsule 400 mg 400 mg Oral NIGHTLY       Allergies   Allergen Reactions   . Adhesive      Red rash       Physical Exam:  General: appears in good health,  appears stated age and no distress  Eyes: Conjunctiva clear.  HENT:Head atraumatic and normocephalic  Lungs: Clear to auscultation bilaterally.   Cardiovascular: regular rate and rhythm  no murmurs  Abdomen: Soft, non-tender, Non-tender, non-distended, scar from laparoscopic surgery above navel   Extremities: No cyanosis or edema    Labs:  Recent Results (from the past 24 hour(s))   CBC       Component Value Range    WBC 6.9  3.5 - 11.0 THOU/uL    RBC 4.35  4.06 - 5.63 MIL/uL    HGB 13.2  12.5 - 16.3 g/dL    HCT 62.4  63.2 - 52.9 %    MCV 86.0  78 - 100 fL    MCH 30.4  27.4 - 33.0 pg    MCHC 35.3  32.5 - 35.8 g/dL    RDW 86.9  87.9 - 84.9 %    PLATELET COUNT 171  140 - 450 THOU/uL    MPV 8.9  7.5 - 11.5 fL     Radiology:  CXR no acute cardiopulmonary process.     CT 8/17:  FINDINGS: There is no evidence of acute infarction, hemorrhage, or abnormal space occupying mass. The gray-white matter differentiation is preserved. The ventricles are normal in appearance. The visualized portions of skull and scalp are normal. The paranasal sinuses are partially imaged and demonstrate normal development and aeration. Partial imaging of the orbits shows symmetry.    CT-A 8/20:  1. No acute intracranial process   2. No  intracranial or extracranial angiographic abnormality is detected.     PT/OT: No    Consults: Neurology recs in chart    Hardware (lines, foley's, tubes): PIV, telemetry    Assessment/ Plan:   Patient is a 46 year old African-American male who presents with left-sided chest pain radiating to arm and SOB. Patient's chest pain resolved, Troponins, CXR, and EKG were normal/unremarkable. Patient also with h/o migraine headaches which worsened after admission.     Active Hospital Problems    Diagnosis   . Primary Problem: Anginal pain   . Hypertriglyceridemia   . Hyperlipidemia   . Borderline diabetes mellitus     Migraine headaches  - provided SC Imitrex  2 doses over weekend with some relief, patient says wore off after 2.5h  - also providing Reglan  and Benadryl  but patient says pain not controlled   - on Toradol  for pain   - placed on Zanaflex  for occipital/cervical muscle tension  - 8/17 CT, no abnormal findings  - 8/20 CT-A, no abnormal findings    Non cardiac Chest Pain   - Troponins negative x3   - EKG shows new 1st degree AV block   - Nitroglycerin  .4 mg prn   - Unlikely that this is a new infarct. Will monitor patient.   - Negative cardiac cath/ MPS in 2009.   - Continue metoprolol    - Continue IMDUR  30 mg daily   - Aspirin  81 daily   - Continue crestor  20 mg.     HTN   - Metoprolol      Hypertryglyceridemia  - lipid panel, chol 167, HDL 32, LDL 94, TRIGLYCERIDES 205, VLDL 41  - Crestor  20mg       DVT/PE Prophylaxis: Lovenox     Disposition Planning: Home discharge      Eveleen JAYSON Bars, MD 07/17/2012, 1:28 PM    I performed a history and physical exam of the patient and discussed patient's management with the resident.  I reviewed the  resident's note and agree with the resident's findings and plan.  Any emendations to the above note were made directly in the note.  I have discussed the above with the patient and answered all of his questions to his satisfaction. He is in agreement with the above plan.    Harlene Neth, MD  Assistant Professor  Endocrinology & Metabolism  [facility]

## 2012-07-17 NOTE — Discharge Summary (Addendum)
DISCHARGE SUMMARY      PATIENT NAME:  Spencer [name], Spencer [name]  MRN:  [id number]  DOB:  January 12, 1966    ADMISSION DATE:  07/13/2012  DISCHARGE DATE:  07/17/2012    ATTENDING PHYSICIAN: Edsel Petrin, MD  PRIMARY CARE PHYSICIAN: [name], DO     CHIEF COMPLAINT:    Chief Complaint   Patient presents with   . Headache   . Chest Pain      began 2 days ago. +SOB. N/V.   Marland Kitchen Palpitations     DISCHARGE DIAGNOSIS:     Principle Problem: Anginal pain  Active Hospital Problems    Diagnosis Date Noted   . Principle Problem: Anginal pain 06/03/2010   . Hypertriglyceridemia 06/03/2010   . Hyperlipidemia 12/26/2007   . Borderline diabetes mellitus 12/23/2007      Resolved Hospital Problems    Diagnosis    No resolved problems to display.     Active Non-Hospital Problems    Diagnosis Date Noted   . 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 BPH (benign prostatic hyperplasia) 07/15/2011   . Crohn's disease 06/14/2010   . IBS (irritable bowel syndrome) 06/14/2010   . GERD (gastroesophageal reflux disease) 06/14/2010   . PAD (peripheral artery disease) 06/03/2010   . CAD (coronary artery disease) 06/03/2010   . S/P cardiac catheterization 05/28/2010      DISCHARGE MEDICATIONS:  Current Discharge Medication List      START taking these medications    Details   Acetaminophen 650 mg Oral Tablet Take 1 Tab (650 mg total) by mouth Every 6 hours for 30 days  Qty: 120 Tab, Refills: 0      tiZANidine (ZANAFLEX) 2 mg Oral Tablet Take 1 Tab (2 mg total) by mouth Three times a day as needed for 14 days  Qty: 42 Tab, Refills: 0         CONTINUE these medications which have NOT CHANGED    Details   SUMATRIPTAN SUCC/NAPROXEN SOD (TREXIMET ORAL) Take by mouth      isosorbide mononitrate (IMDUR) 30 mg Oral Tablet Sustained Release 24 hr Take 1 Tab (30 mg total) by mouth Once a day  Qty: 90 Tab, Refills: 3    Associated Diagnoses: Myocardial bridge; CAD (coronary artery disease); Anginal pain       nitroglycerin (NITROSTAT) 0.4 mg Sublingual Tablet, Sublingual for 3 doses over 15 minutes  Qty: 25 Tab, Refills: 3    Associated Diagnoses: Myocardial bridge; CAD (coronary artery disease); Anginal pain      Hyoscyamine Sulfate (ANASPAZ, LEVSIN) 0.125 mg Oral Tablet take 1 Tab by mouth Every 6 hours as needed. Please give sublingual form  Qty: 30 Tab, Refills: 1    Associated Diagnoses: IBS (irritable bowel syndrome)      DOXEPIN HCL (DOXEPIN ORAL) Take 25 mg by mouth Once per day as needed       metoprolol (LOPRESSOR) 25 mg Oral Tablet take 1 Tab by mouth Twice daily.  Qty: 60 Tab, Refills: 5      rosuvastatin (CRESTOR) 20 mg Oral Tablet take 1 Tab by mouth QPM.  Qty: 30 Tab, Refills: 5      Gabapentin (NEURONTIN) 400 mg Oral Capsule take 1 Cap by mouth every night.  Qty: 30 Cap, Refills: 0      aspirin 325 mg Tab take 325 mg by mouth Once a day.           DISCHARGE INSTRUCTIONS:     DISCHARGE INSTRUCTION - MISC  Follow up with Cardiology within 2-weeks. Scheduler will call with details.  Follow up with PCP within 1-week.     SCHEDULE FOLLOW-UP - CARDIOLOGY - Nada Boozer   Reason for visit: HOSPITAL DISCHARGE    Followup reason: CHEST PAIN    Provider Trina Ao MD    Follow-up in: On September 10 at 7:45am      RETURN TO WORK/SCHOOL   Patient May Return to Work: 07/21/2012    Patient Was Seen in Clinic/ED on: 07/13/2012    Patient Was in the Care of a Physician on the Following Dates: 8/16 - 8/20         REASON FOR HOSPITALIZATION AND HOSPITAL COURSE:  This is a 46 y.o., male who presents on 8/16 to [facility] ED with with left sided chest pain radiating to the left arm. It is constant, unremitting with no alleviating or exacerbating factors. He says he feels a lot of pressure on his chest The pain started on Tuesday and continued until this evening when he began to experience shortness of breath while seated. Mr. Koury has a PMH of CAD, myocardial bridging, Crohn's disease, GERD, and a heart catheterization.     Workup for chest pain: Troponins negative for 3-sets, EKG shows new 1st degree AV block, and CXR showed no evidence for acute cardiopulmonary process. Patient had a negative cardiac catheterization/MPS in 2009.  Patient continued on home meds, including: Metoprolol, Imdur, ASA, and Crestor.     After admission, patient also complained of worsening migraine headaches. He described headaches that kept him awake throughout the night, described as pulsating, starting in the occipital region and migrating forward across the top and sides of the head. Patient also reported photophobia and phonophobia in association with headaches.     CT brain on 8/17 showed no evidence of acute infarction, hemorrhage, or abnormal space occupying mass. The gray-white matter differentiation is preserved. The ventricles are normal in appearance. The visualized portions of skull and scalp are normal. The paranasal sinuses are partially imaged and demonstrate normal development and aeration. Partial imaging of the orbits shows symmetry.    For migraine headache patient had some relief with SC Imitrex 2 doses over weekend but patient says wore off after 2.5h  Also provided Reglan and Benadryl but patient said pain not controlled, these meds only made him more tired. Patient was also given Toradol for pain. Neurology consult describes occipital headache with muscle tenderness/spasm over posterior neck and worsening on turning head to left concurrent with migraine headaches. Neurology recommends Zanaflex 2mg  tid. Patient described relief after receiving Zanaflex.    A CT-A was also performed on 8/19 and showed no acute intracranial process, and no intracranial or extracranial angiographic abnormality is detected.     Patient discharged with acetaminophen and Zanaflex for pain control and advised to continue home medications. Patient will follow up with Cardiology and PCP.    Migraine headaches with superimposed muscle spasm     CONDITION ON DISCHARGE:  A. Ambulation: Full ambulation  B. Self-care Ability: complete  C. Cognitive Status Alert and Oriented x 3    DISCHARGE DISPOSITION:  Home discharge     cc: Primary Care Physician:  [name], DO  [address]  [facility] [place] [address]    Romeo Rabon, MD 07/17/2012, 1:54 PM         I performed a history and physical exam of the patient and discussed patient's management with the resident.  I reviewed  the resident's note and agree with the resident's findings and plan.  Any emendations to the above note were made directly in the note.  Edsel Petrin, MD  Assistant Professor  Endocrinology & Metabolism  [facility]

## 2012-08-02 ENCOUNTER — Other Ambulatory Visit (INDEPENDENT_AMBULATORY_CARE_PROVIDER_SITE_OTHER): Payer: Self-pay | Admitting: PHYSICAL MEDICINE AND REHABILITATION

## 2012-08-02 ENCOUNTER — Other Ambulatory Visit (HOSPITAL_COMMUNITY): Payer: Self-pay | Admitting: PHYSICAL MEDICINE AND REHABILITATION

## 2012-08-05 ENCOUNTER — Encounter (INDEPENDENT_AMBULATORY_CARE_PROVIDER_SITE_OTHER): Payer: Self-pay | Admitting: Pain Medicine

## 2012-08-05 ENCOUNTER — Ambulatory Visit (INDEPENDENT_AMBULATORY_CARE_PROVIDER_SITE_OTHER)
Admission: RE | Admit: 2012-08-05 | Discharge: 2012-08-05 | Disposition: A | Discharge status: Home / Self Care | Source: Ambulatory Visit | Attending: PHYSICAL MEDICINE AND REHABILITATION | Admitting: PHYSICAL MEDICINE AND REHABILITATION

## 2012-08-05 ENCOUNTER — Ambulatory Visit
Admission: RE | Admit: 2012-08-05 | Discharge: 2012-08-05 | Disposition: A | Discharge status: Home / Self Care | Source: Ambulatory Visit | Attending: PHYSICAL MEDICINE AND REHABILITATION | Admitting: PHYSICAL MEDICINE AND REHABILITATION

## 2012-08-07 ENCOUNTER — Ambulatory Visit (INDEPENDENT_AMBULATORY_CARE_PROVIDER_SITE_OTHER): Admitting: Internal Medicine

## 2012-08-07 NOTE — Progress Notes (Signed)
Spencer [name] was a NO SHOW for his appointment today.

## 2012-08-10 ENCOUNTER — Observation Stay
Admission: EM | Admit: 2012-08-10 | Discharge: 2012-08-11 | Disposition: A | Discharge status: Home / Self Care | Attending: Neurology | Admitting: Neurology

## 2012-08-10 ENCOUNTER — Inpatient Hospital Stay (HOSPITAL_BASED_OUTPATIENT_CLINIC_OR_DEPARTMENT_OTHER): Admitting: Neurology

## 2012-08-10 ENCOUNTER — Encounter (HOSPITAL_COMMUNITY): Payer: Self-pay

## 2012-08-10 DIAGNOSIS — Q245 Malformation of coronary vessels: Secondary | ICD-10-CM | POA: Insufficient documentation

## 2012-08-10 DIAGNOSIS — R110 Nausea: Secondary | ICD-10-CM

## 2012-08-10 DIAGNOSIS — I2510 Atherosclerotic heart disease of native coronary artery without angina pectoris: Secondary | ICD-10-CM | POA: Insufficient documentation

## 2012-08-10 DIAGNOSIS — I10 Essential (primary) hypertension: Secondary | ICD-10-CM | POA: Insufficient documentation

## 2012-08-10 DIAGNOSIS — E785 Hyperlipidemia, unspecified: Secondary | ICD-10-CM | POA: Insufficient documentation

## 2012-08-10 DIAGNOSIS — G43909 Migraine, unspecified, not intractable, without status migrainosus: Principal | ICD-10-CM | POA: Insufficient documentation

## 2012-08-10 DIAGNOSIS — R7309 Other abnormal glucose: Secondary | ICD-10-CM | POA: Insufficient documentation

## 2012-08-10 DIAGNOSIS — R51 Headache: Secondary | ICD-10-CM

## 2012-08-10 HISTORY — DX: Presence of spectacles and contact lenses: Z97.3

## 2012-08-10 LAB — BASIC METABOLIC PANEL
ANION GAP: 7 mmol/L (ref 5–16)
BUN/CREAT RATIO: 11 (ref 6–22)
BUN: 11 mg/dL (ref 8–26)
CALCIUM: 8.7 mg/dL (ref 8.5–10.4)
CARBON DIOXIDE: 27 mmol/L (ref 23–33)
CREATININE: 1.01 mg/dL (ref 0.62–1.27)
ESTIMATED GLOMERULAR FILTRATION RATE: >59 ml/min/1.73m2 (ref >59)
POTASSIUM: 4.1 mmol/L (ref 3.5–5.1)
SODIUM: 139 mmol/L (ref 136–145)

## 2012-08-10 LAB — ALT (SGPT): ALT (SGPT): 16 U/L (ref 7–55)

## 2012-08-10 LAB — CBC/DIFF
BASOS ABS: 0 10*3/uL (ref 0.0–0.2)
EOS ABS: 0.1 THOU/uL (ref 0.0–0.5)
MCV: 88 fL (ref 78–100)
MONOCYTES: 6 %
MONOS ABS: 0.4 10*3/uL (ref 0.3–1.0)
PMN ABS: 3.1 THOU/uL (ref 2.0–6.5)
PMN'S: 52 %
WBC: 6.1 THOU/uL (ref 3.5–11.0)

## 2012-08-10 LAB — C-REACTIVE PROTEIN(CRP),INFLAMMATION: C-REACTIVE PROTEIN (CRP),INFLAMMATION: 0.364 mg/dL (ref <0.800)

## 2012-08-10 LAB — AST (SGOT)
AST (SGOT): 17 U/L (ref 8–48)
AST (SGOT): 21 U/L (ref 8–48)

## 2012-08-10 LAB — SEDIMENTATION RATE: SEDIMENTATION RATE: 10 mm/hr (ref 0–15)

## 2012-08-10 MED ORDER — GABAPENTIN 300 MG CAPSULE
400.00 mg | ORAL_CAPSULE | Freq: Every evening | ORAL | Status: DC
Start: 2012-08-11 — End: 2012-08-10

## 2012-08-10 MED ORDER — NITROGLYCERIN 0.4 MG SUBLINGUAL TABLET
0.4000 mg | SUBLINGUAL_TABLET | SUBLINGUAL | Status: DC | PRN
Start: 2012-08-10 — End: 2012-08-11

## 2012-08-10 MED ORDER — KETOROLAC 30 MG/ML (1 ML) INJECTION SOLUTION
30.0000 mg | Freq: Three times a day (TID) | INTRAMUSCULAR | Status: DC | PRN
Start: 2012-08-10 — End: 2012-08-10
  Filled 2012-08-10 (×2): qty 1

## 2012-08-10 MED ORDER — MAGNESIUM SULFATE 1 GRAM/100 ML IN DEXTROSE 5 % INTRAVENOUS PIGGYBACK
1.0000 g | INJECTION | Freq: Three times a day (TID) | INTRAVENOUS | Status: DC
Start: 2012-08-10 — End: 2012-08-10

## 2012-08-10 MED ORDER — MAGNESIUM SULFATE 1 GRAM/100 ML IN DEXTROSE 5 % INTRAVENOUS PIGGYBACK
1.0000 g | INJECTION | Freq: Three times a day (TID) | INTRAVENOUS | Status: DC
Start: 2012-08-10 — End: 2012-08-11
  Administered 2012-08-10 – 2012-08-11 (×2): 1 g via INTRAVENOUS
  Administered 2012-08-11: 0 g via INTRAVENOUS
  Administered 2012-08-11: 1 g via INTRAVENOUS
  Filled 2012-08-10 (×3): qty 100

## 2012-08-10 MED ORDER — SODIUM CHLORIDE 0.9 % INTRAVENOUS SOLUTION
250.0000 mg | Freq: Four times a day (QID) | INTRAVENOUS | Status: DC
Start: 2012-08-10 — End: 2012-08-10
  Filled 2012-08-10 (×2): qty 6.25

## 2012-08-10 MED ORDER — DIPHENHYDRAMINE 50 MG/ML INJECTION SOLUTION
25.0000 mg | Freq: Three times a day (TID) | INTRAMUSCULAR | Status: DC
Start: 2012-08-10 — End: 2012-08-10

## 2012-08-10 MED ORDER — HEPARIN (PORCINE) 5,000 UNIT/ML INJECTION SOLUTION
5000.0000 [IU] | Freq: Three times a day (TID) | INTRAMUSCULAR | Status: DC
Start: 2012-08-10 — End: 2012-08-11
  Administered 2012-08-10 – 2012-08-11 (×2): 5000 [IU] via SUBCUTANEOUS
  Filled 2012-08-10 (×5): qty 1

## 2012-08-10 MED ORDER — METOPROLOL TARTRATE 25 MG TABLET
25.00 mg | ORAL_TABLET | Freq: Two times a day (BID) | ORAL | Status: DC
Start: 2012-08-11 — End: 2012-08-11
  Administered 2012-08-11: 0 mg via ORAL
  Administered 2012-08-11: 25 mg via ORAL
  Filled 2012-08-10 (×3): qty 1

## 2012-08-10 MED ORDER — SODIUM CHLORIDE 0.9 % INTRAVENOUS SOLUTION
INTRAVENOUS | Status: DC
Start: 2012-08-10 — End: 2012-08-11
  Administered 2012-08-11: 0 via INTRAVENOUS

## 2012-08-10 MED ORDER — NITROGLYCERIN 0.4 MG SUBLINGUAL TABLET
0.40 mg | SUBLINGUAL_TABLET | SUBLINGUAL | Status: DC | PRN
Start: 2012-08-10 — End: 2012-08-10

## 2012-08-10 MED ORDER — ISOSORBIDE MONONITRATE ER 30 MG TABLET,EXTENDED RELEASE 24 HR
30.0000 mg | ORAL_TABLET | Freq: Every day | ORAL | Status: DC
Start: 2012-08-11 — End: 2012-08-11
  Administered 2012-08-11: 30 mg via ORAL
  Filled 2012-08-10: qty 1

## 2012-08-10 MED ORDER — KETOROLAC 30 MG/ML (1 ML) INJECTION SOLUTION
30.00 mg | INTRAMUSCULAR | Status: AC
Start: 2012-08-10 — End: 2012-08-10

## 2012-08-10 MED ORDER — DIPHENHYDRAMINE 50 MG/ML INJECTION SOLUTION
12.50 mg | INTRAMUSCULAR | Status: AC
Start: 2012-08-10 — End: 2012-08-10

## 2012-08-10 MED ORDER — DIPHENHYDRAMINE 50 MG/ML INJECTION SOLUTION
25.0000 mg | Freq: Three times a day (TID) | INTRAMUSCULAR | Status: DC
Start: 2012-08-10 — End: 2012-08-11
  Administered 2012-08-10 – 2012-08-11 (×3): 25 mg via INTRAVENOUS
  Filled 2012-08-10 (×3): qty 1

## 2012-08-10 MED ORDER — HYDROMORPHONE 1 MG/ML INJECTION WRAPPER
INJECTION | INTRAMUSCULAR | Status: AC
Start: 2012-08-10 — End: 2012-08-10
  Administered 2012-08-10: 1 mg via INTRAVENOUS
  Filled 2012-08-10: qty 1

## 2012-08-10 MED ORDER — METOCLOPRAMIDE 5 MG/ML INJECTION SOLUTION
INTRAMUSCULAR | Status: AC
Start: 2012-08-10 — End: 2012-08-10
  Administered 2012-08-10: 10 mg via INTRAVENOUS
  Filled 2012-08-10: qty 2

## 2012-08-10 MED ORDER — KETOROLAC 30 MG/ML (1 ML) INJECTION SOLUTION
INTRAMUSCULAR | Status: AC
Start: 2012-08-10 — End: 2012-08-10
  Administered 2012-08-10: 30 mg via INTRAVENOUS
  Filled 2012-08-10: qty 1

## 2012-08-10 MED ORDER — DIPHENHYDRAMINE 50 MG/ML INJECTION SOLUTION
INTRAMUSCULAR | Status: AC
Start: 2012-08-10 — End: 2012-08-10
  Administered 2012-08-10: 12.5 mg via INTRAVENOUS
  Filled 2012-08-10: qty 1

## 2012-08-10 MED ORDER — SODIUM CHLORIDE 0.9 % INTRAVENOUS SOLUTION
INTRAVENOUS | Status: DC
Start: 2012-08-10 — End: 2012-08-11
  Administered 2012-08-10: 30 mL via INTRAVENOUS

## 2012-08-10 MED ORDER — METOCLOPRAMIDE 5 MG/ML INJECTION SOLUTION
10.0000 mg | Freq: Three times a day (TID) | INTRAMUSCULAR | Status: DC
Start: 2012-08-10 — End: 2012-08-11
  Administered 2012-08-10 – 2012-08-11 (×3): 10 mg via INTRAVENOUS
  Filled 2012-08-10 (×5): qty 2

## 2012-08-10 MED ORDER — SODIUM CHLORIDE 0.9 % INTRAVENOUS SOLUTION
250.0000 mg | Freq: Two times a day (BID) | INTRAVENOUS | Status: DC
Start: 2012-08-10 — End: 2012-08-11
  Administered 2012-08-10: 250 mg via INTRAVENOUS
  Administered 2012-08-11: 0 mg via INTRAVENOUS
  Administered 2012-08-11: 250 mg via INTRAVENOUS
  Filled 2012-08-10 (×2): qty 6.25

## 2012-08-10 MED ORDER — PROCHLORPERAZINE EDISYLATE 10 MG/2 ML (5 MG/ML) INJECTION SOLUTION
10.0000 mg | Freq: Three times a day (TID) | INTRAMUSCULAR | Status: DC
Start: 2012-08-10 — End: 2012-08-11
  Administered 2012-08-10 – 2012-08-11 (×2): 10 mg via INTRAVENOUS
  Filled 2012-08-10 (×6): qty 2

## 2012-08-10 MED ORDER — KETOROLAC 30 MG/ML (1 ML) INJECTION SOLUTION
30.0000 mg | Freq: Three times a day (TID) | INTRAMUSCULAR | Status: DC
Start: 2012-08-10 — End: 2012-08-11
  Administered 2012-08-10 – 2012-08-11 (×2): 30 mg via INTRAVENOUS
  Filled 2012-08-10 (×5): qty 1

## 2012-08-10 MED ORDER — HYDROMORPHONE 1 MG/ML INJECTION WRAPPER
1.00 mg | INJECTION | INTRAMUSCULAR | Status: AC
Start: 2012-08-10 — End: 2012-08-10

## 2012-08-10 MED ORDER — ESOMEPRAZOLE MAGNESIUM 40 MG CAPSULE,DELAYED RELEASE
40.0000 mg | DELAYED_RELEASE_CAPSULE | Freq: Every morning | ORAL | Status: DC
Start: 2012-08-10 — End: 2012-08-11
  Administered 2012-08-10 – 2012-08-11 (×2): 40 mg via ORAL
  Filled 2012-08-10 (×2): qty 1

## 2012-08-10 MED ORDER — METOCLOPRAMIDE 5 MG/ML INJECTION SOLUTION
10.00 mg | INTRAMUSCULAR | Status: AC
Start: 2012-08-10 — End: 2012-08-10

## 2012-08-10 MED ORDER — ROSUVASTATIN 10 MG TABLET
20.00 mg | ORAL_TABLET | Freq: Every evening | ORAL | Status: DC
Start: 2012-08-11 — End: 2012-08-11
  Administered 2012-08-11: 0 mg via ORAL
  Filled 2012-08-10 (×2): qty 2

## 2012-08-10 MED ORDER — ASPIRIN 325 MG TABLET
325.0000 mg | ORAL_TABLET | Freq: Every day | ORAL | Status: DC
Start: 2012-08-11 — End: 2012-08-11
  Administered 2012-08-11: 325 mg via ORAL
  Filled 2012-08-10: qty 1

## 2012-08-10 MED ORDER — GABAPENTIN 100 MG CAPSULE
400.00 mg | ORAL_CAPSULE | Freq: Every evening | ORAL | Status: DC
Start: 2012-08-11 — End: 2012-08-11
  Administered 2012-08-11: 0 mg via ORAL
  Filled 2012-08-10 (×2): qty 1

## 2012-08-10 NOTE — H&P (Addendum)
[facility]  Neurology Admission  History and Physical    Mccartney,Spencer [name], 46 y.o. male  Date of Admission:  08/10/2012  Date of Birth:  [birth year]    PCP: [name], DO    Information obtained from: patient  Chief Complaint:  headache    [id number] Spencer [name] is a 46 y.o. male with PMH of IBS, chest pain - takes prn NTG, normal cardiac cath in jan/2009, myocardial bridging, DM - not on meds and migraines presented with headache. His headache started last Wednesday and he had tried trexemet, excedrin, tylenol, benadryl and it didn't help. He complained of generalized headache, throbbing associated with nausea, generalized weakness, lightheadedness, light/sound sensitivity.   Says has migraine since 2006 and gets headache 1-2 times per month. Patient had headache and chest pain 2-3 weeks ago and was admitted here. Patient had CT brain and CTA done in aug/2013 and were unremarkable. He was seen by neurology and her headaches later improved and was discharged on zanaflex. However, patient doesn't remember using zanaflex.   Denied any chest pain, speech problem, gait problem, vision problem.      Past Medical History   Diagnosis Date   . Colitis    . 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 Migraines    . Reflux    . Chest pain 12/23/2007   . Borderline diabetes mellitus 12/23/2007   . Spells 06/16/2008   . Stress test 05/28/2010   . S/P cardiac catheterization 05/28/2010   . PAD (peripheral artery disease) 06/03/2010   . Hypertriglyceridemia 06/03/2010   . Anginal pain 06/03/2010   . CAD (coronary artery disease) 06/03/2010   . Crohn's disease 06/14/2010   . IBS (irritable bowel syndrome) 06/14/2010   . GERD (gastroesophageal reflux disease) 06/14/2010   . Hyperlipidemia 12/26/2007     denies cp sob or mi hx   . Post traumatic stress disorder (PTSD)      currently being evaluated   . Arthritis      spine   . HTN    . 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 BPH (benign prostatic hyperplasia) 07/15/2011   . CVA (cerebrovascular accident) 12/2007   . Wears glasses      Past Surgical History   Procedure Laterality Date   . Pb colonoscopy,biopsy     . Hx laparotomy     . Hx heart catheterization     . Colonoscopy  09/24/2010     COLONOSCOPY performed by [name] at [facility] OR ENDO     Medications Prior to Admission    Medication    Acetaminophen 650 mg Oral Tablet    Take 1 Tab (650 mg total) by mouth Every 6 hours for 30 days    SUMATRIPTAN SUCC/NAPROXEN SOD (TREXIMET ORAL)    Take by mouth    isosorbide mononitrate (IMDUR) 30 mg Oral Tablet Sustained Release 24 hr    Take 1 Tab (30 mg total) by mouth Once a day    Hyoscyamine Sulfate (ANASPAZ, LEVSIN) 0.125 mg Oral Tablet    take 1 Tab by mouth Every 6 hours as needed. Please give sublingual form    DOXEPIN HCL (DOXEPIN ORAL)    Take 25 mg by mouth Once per day as needed     metoprolol (LOPRESSOR) 25 mg Oral Tablet    take 1 Tab by mouth Twice daily.    rosuvastatin (CRESTOR) 20 mg Oral Tablet    take 1  Tab by mouth QPM.    Gabapentin (NEURONTIN) 400 mg Oral Capsule    take 1 Cap by mouth every night.    aspirin 325 mg Tab    take 325 mg by mouth Once a day.    nitroglycerin (NITROSTAT) 0.4 mg Sublingual Tablet, Sublingual    for 3 doses over 15 minutes        Allergies   Allergen Reactions   . Adhesive      Red rash     History   Substance Use Topics   . Smoking status: Never Smoker    . Smokeless tobacco: Never Used   . Alcohol Use: No      rarely     Family History   Problem Relation Age of Onset   . Hypertension Mother    . Hypertension Brother    . Diabetes Brother    . Hypertension Brother        ROS: Other than ROS in the HPI, all other systems were negative.    Exam:  Temperature: 36.5 C (97.7 F)  Heart Rate: 64   BP (Non-Invasive): 130/72 mmHg  Respiratory Rate: 20   SpO2-1: 98 %  Pain Score (Numeric, Faces): 9  General: appears in good health  HENT:Head atraumatic and normocephalic   Neck: No JVD or thyromegaly or lymphadenopathy  Carotids:Carotids normal without bruit  Lungs: Clear to auscultation bilaterally.   Cardiovascular: regular rate and rhythm  Abdomen: Soft, non-tender  Extremities: No cyanosis or edema  Ophthalomscopic: normal w/o hemorrhages, exudates, or papilledema  Mental status:  Level of Consciousness: alert  Orientations: Alert and oriented x 3  MemoryRegistration, Recall, and Following of commands is normal  AttentionsAttention and Concentration are normal  Knowledge: Good  Language: Normal  Speech: Normal  Cranial nerves:   CN2: Visual acuity and fields intact  CN 3,4,6: EOMI, PERRLA  CN 5Facial sensation intact  CN 7Face symmetrical  CN 8: Hearing grossly intact  CN 9,10: Palate symmetric and gag normal  CN 11: Sternocleidomastoid and Trapezius have normal strength.  CN 12: Tongue normal with no fasiculations or deviation  Gait, Coordination, and Reflexes:   Gait: Normal  Coordination: Coordination is normal without tremor    Muscle tone: WNL  Muscle exam  Arm Right Left Leg Right Left   Deltoid 4/5 4/5 Iliopsoas 4/5 4/5   Biceps 4/5 4/5 Quads 4/5 4/5   Triceps 4/5 4/5 Hamstrings 4/5 4/5   Wrist Extension 4/5 4/5 Ankle Dorsi Flexion 4/5 4/5   Wrist Flexion 4/5 4/5 Ankle Plantar Flexion 4/5 4/5   Interossei 4/5 4/5 Ankle Eversion 4/5 4/5   APB 4/5 4/5 Ankle Inversion 4/5 4/5       Reflexes   RJ BJ TJ KJ AJ Plantars Hoffman's   Right 2+ 2+ 2+ 2+ 2+ Downgoing Not present   Left 2+ 2+ 2+ 2+ 2+ Downgoing Not present       Sensory: Sensory exam in the upper and lower extremities is normal  Diabetes Monitors:  No ulcerations or decreased sensations on feet.    Labs:    Lab Results for Last 24 Hours:    Results for orders placed during the hospital encounter of 08/10/12 (from the past 24 hour(s))   C-REACTIVE PROTEIN(CRP),INFLAMMATION       Result Value Range    C-REACTIVE PROTEIN HIGH SENSITIVITY (INFLAMMATION) 0.364  <0.800 mg/dL   SEDIMENTATION RATE       Result Value Range     SEDIMENTATION RATE  10  0 - 15 mm/hr   CBC/DIFF       Result Value Range    WBC 6.1  3.5 - 11.0 THOU/uL    RBC 4.51  4.06 - 5.63 MIL/uL    HGB 13.5  12.5 - 16.3 g/dL    HCT 16.1  09.6 - 04.5 %    MCV 88.0  78 - 100 fL    MCH 30.0  27.4 - 33.0 pg    MCHC 34.1  32.5 - 35.8 g/dL    RDW 40.9  81.1 - 91.4 %    PLATELET COUNT 177  140 - 450 THOU/uL    MPV 8.6  7.5 - 11.5 fL    PMN'S 52      PMN ABS 3.100  2.0 - 6.5 THOU/uL    LYMPHOCYTES 40      LYMPHS ABS 2.500  1.5 - 3.5 THOU/uL    MONOCYTES 6      MONOS ABS 0.400  0.3 - 1.0 THOU/uL    EOSINOPHIL 2      EOS ABS 0.100  0.0 - 0.5 THOU/uL    BASOPHILS 0      BASOS ABS 0.000  0.0 - 0.2 THOU/uL   BASIC METABOLIC PANEL, NON-FASTING       Result Value Range    SODIUM 139  136 - 145 mmol/L    POTASSIUM 4.1  3.5 - 5.1 mmol/L    CHLORIDE 105  96 - 111 mmol/L    CARBON DIOXIDE 27  23 - 33 mmol/L    ANION GAP 7  5 - 16 mmol/L    CREATININE 1.01  0.62 - 1.27 mg/dL    ESTIMATED GLOMERULAR FILTRATION RATE >59  >59 ml/min/1.56m2    GLUCOSE,NONFAST 92  65 - 139 mg/dL    BUN 11  8 - 26 mg/dL    BUN/CREAT RATIO 11  6 - 22    CALCIUM 8.7  8.5 - 10.4 mg/dL   AST (SGOT)       Result Value Range    AST (SGOT) 17  8 - 48 U/L   ALT (SGPT)       Result Value Range    ALT (SGPT) 17  7 - 55 U/L   ALT (SGPT)       Result Value Range    ALT (SGPT) 16  7 - 55 U/L   AST (SGOT)       Result Value Range    AST (SGOT) 21  8 - 48 U/L       Review of reports and notes reveal:     Independent Interpretation of images or specimens:  None to review.    DNR Status:  Full Code    Assessment/Plan:  Active Hospital Problems   (*Primary Problem)    Diagnosis   . *Headache   46 year old male with Migraine  -start IV solumedrol, Reglan, Magnesium, Toradol  -EKG  -vitals, neuro checks  HTN  -vitals  - continue metoprolol, Imdur  HPL  -continue crestor  DM  -not on meds  -monitor fingersticks.  CAD  -stable  -ASA, beta blocker  -NTG prn    DVT/PE Prophylaxis: Heparin    Vivien Rota, MD 08/10/2012, 10:33 PM       I saw and examined the patient.  I reviewed the resident's note.  I agree with the findings and plan of care as documented in the resident's note.  Any exceptions/additions are edited/noted.  Norva Riffle, MD 08/11/2012, 8:42 PM

## 2012-08-10 NOTE — ED Nurses Note (Signed)
Frontal headache radiating back into spine and base of head.  Patient also states he has chills,blurred vision, nausea, pain in his ears, loss of appetite, and stiffness of neck.  Denies vomiting and diarrhea.

## 2012-08-10 NOTE — ED Nurses Note (Signed)
 Patient report called to [address].  Denies any further questions.

## 2012-08-10 NOTE — ED Attending Note (Signed)
I have seen and evaluated this patient.  I have reviewed the assessment and management plans and agree with the medical evaluation/clinical care of the patient.      In brief, the patient is a 46 y.o. male with HA for 3 days.  No LOC.  No difficulty reported with walking, talking, or vision.  Current HA is similar to prior HA.  Further historical details can be found in the MLP's note.    PE:  Neurologic: Cranial nerves II through XII fully intact.  Normal motor and sensory function of the upper and lower extremities bilaterally.  Normal speech and comprehension.  Normal cerebellar function.    Diagnostic tests reviewed:  Results for orders placed during the hospital encounter of 08/10/12 (from the past 12 hour(s))   C-REACTIVE PROTEIN(CRP),INFLAMMATION       Result Value Range    C-REACTIVE PROTEIN HIGH SENSITIVITY (INFLAMMATION) 0.364  <0.800 mg/dL   SEDIMENTATION RATE       Result Value Range    SEDIMENTATION RATE 10  0 - 15 mm/hr   CBC/DIFF       Result Value Range    WBC 6.1  3.5 - 11.0 THOU/uL    RBC 4.51  4.06 - 5.63 MIL/uL    HGB 13.5  12.5 - 16.3 g/dL    HCT 16.1  09.6 - 04.5 %    MCV 88.0  78 - 100 fL    MCH 30.0  27.4 - 33.0 pg    MCHC 34.1  32.5 - 35.8 g/dL    RDW 40.9  81.1 - 91.4 %    PLATELET COUNT 177  140 - 450 THOU/uL    MPV 8.6  7.5 - 11.5 fL    PMN'S 52      PMN ABS 3.100  2.0 - 6.5 THOU/uL    LYMPHOCYTES 40      LYMPHS ABS 2.500  1.5 - 3.5 THOU/uL    MONOCYTES 6      MONOS ABS 0.400  0.3 - 1.0 THOU/uL    EOSINOPHIL 2      EOS ABS 0.100  0.0 - 0.5 THOU/uL    BASOPHILS 0      BASOS ABS 0.000  0.0 - 0.2 THOU/uL   BASIC METABOLIC PANEL, NON-FASTING       Result Value Range    SODIUM 139  136 - 145 mmol/L    POTASSIUM 4.1  3.5 - 5.1 mmol/L    CHLORIDE 105  96 - 111 mmol/L    CARBON DIOXIDE 27  23 - 33 mmol/L    ANION GAP 7  5 - 16 mmol/L    CREATININE 1.01  0.62 - 1.27 mg/dL    ESTIMATED GLOMERULAR FILTRATION RATE >59  >59 ml/min/1.11m2    GLUCOSE,NONFAST 92  65 - 139 mg/dL    BUN 11  8 - 26 mg/dL     BUN/CREAT RATIO 11  6 - 22    CALCIUM 8.7  8.5 - 10.4 mg/dL       MDM:  Seen and evaluated by the neurology service.    At the conclusion of my shift, neurology consultation was in progress.  Care transferred to Dr. Alla German at 6:06 PM.    Impression:  Intractable headache.    Plan:  Admission.

## 2012-08-10 NOTE — ED Attending Handoff Note (Signed)
I assumed care of the patient 6:03 PM.  History, physical and workup in the emergency department were discussed with Dr. Andres Labrum.  Refractory headache.  Admission to neurology.

## 2012-08-10 NOTE — ED Provider Notes (Signed)
The following note was written per dictation of [name].  [name], SCRIBE  Attending: Karie Fetch  CC: Headache  HPI  Spencer [name] is a 46 y.o. male presenting to ED via POV c/o 10/10 headache since wednesday.  Pt was here 07/13/2012 with headache and chest pain and pt reports current headache is identical but it is not accompanied with chest pain.  CT was normal at this time.  Pt was admitted to medicine at this time.  Pt was given pain medications and normal headache medications which he reports have not provided any relief.  Pt was also given Zanaflex (a muscle relaxer) which provided some relief.  Pt described headache as frontal headache radiating back into spine and base of head.  Pt also reports photophobia, nausea, lightheadedness, and constipation.  Denies all other symptoms including vomiting, chest pain, SOB, and diarrhea.  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 Myer Peer of [facility] for this. Pt also has a history of PAD HTN, CVA, High cholesterol, DM, Crohn's Disease, and IBS. Pt has a family history of MI and CAD. Pt denies tobacco use and drug use. Pt denies history of DVT. Pt has NKDA.    Review of Systems  Constitutional: No fever, chills or weakness   Skin: No rash or diaphoresis  HENT: No congestion. +Headache.   Eyes: +Photophobia.    Cardio: No chest pain, palpitations or leg swelling.  Respiratory: No cough, wheezing or SOB  GI:  No vomiting.  +Nausea.  +Constipation.     GU:  No urinary changes  MSK: No joint or back pain  Neuro: No seizures or LOC  Psychiatric: No depression, SI or substance abuse  All other systems reviewed and are negative.    History:   PMH:    Past Medical History   Diagnosis Date   . Colitis    . 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 Migraines    . Reflux    . Chest pain 12/23/2007   . Borderline diabetes mellitus 12/23/2007   . Spells 06/16/2008   . Stress test 05/28/2010   . S/P  cardiac catheterization 05/28/2010   . PAD (peripheral artery disease) 06/03/2010   . Hypertriglyceridemia 06/03/2010   . Anginal pain 06/03/2010   . CAD (coronary artery disease) 06/03/2010   . Crohn's disease 06/14/2010   . IBS (irritable bowel syndrome) 06/14/2010   . GERD (gastroesophageal reflux disease) 06/14/2010   . Hyperlipidemia 12/26/2007     denies cp sob or mi hx   . [name] traumatic stress disorder (PTSD)      currently being evaluated   . Arthritis      spine   . HTN    . 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 BPH (benign prostatic hyperplasia) 07/15/2011   . CVA (cerebrovascular accident) 12/2007   . Wears glasses      PSH:    Past Surgical History   Procedure Laterality Date   . Pb colonoscopy,biopsy     . Hx laparotomy     . Hx heart catheterization     . Colonoscopy  09/24/2010     COLONOSCOPY performed by [name] at [facility] OR ENDO     Social Hx:    History     Social History   . Marital Status: Married     Spouse Name: Spencer [name]     Number  of Children: 4   . Years of Education: N/A     Occupational History   .  [employer]     Social History Main Topics   . Smoking status: Never Smoker    . Smokeless tobacco: Never Used   . Alcohol Use: No      rarely   . Drug Use: No          Other Topics Concern   . Ability To Walk 2 Flight Of Steps Without Sob/Cp Yes     Social History Narrative   . No narrative on file     Family Hx:   Family History   Problem Relation Age of Onset   . Hypertension Mother    . Hypertension Brother    . Diabetes Brother    . Hypertension Brother      Allergies:   Allergies   Allergen Reactions   . Adhesive      Red rash       Above history reviewed with patient, changes are as documented.    Physical Exam  Nursing notes reviewed.    ED Triage Vitals   Enc Vitals Group      BP (Non-Invasive) 08/10/12 1248 104/64 mmHg      Heart Rate 08/10/12 1248 65       Respiratory Rate 08/10/12 1248 16       Temperature 08/10/12 1248 36.4 C (97.5 F)      Temp src --       SpO2-1 08/10/12 1248 96 %        Weight 08/10/12 1248 97.523 kg (215 lb)      Height 08/10/12 1248 1.778 m (5\' 10" )      Head Cir --       Peak Flow --       Pain Score --       Pain Loc --       Pain Edu? --       Excl. in GC? --      Constitutional: NAD. Oriented  HENT:   Head: Normocephalic and atraumatic.   Mouth/Throat: Oropharynx is clear and moist.   Eyes: EOMI, PERRL. Photophobic.    Neck: Trachea midline. Neck supple.  Cardiovascular: RRR, No murmurs, rubs or gallops. Intact distal pulses.  Pulmonary/Chest: BS equal bilaterally. No respiratory distress. No wheezes, rales or chest tenderness.   Abdominal: BS +. Abdomen soft, no tenderness, rebound or guarding.               Musculoskeletal: No edema, tenderness or deformity.  Skin: warm and dry. No rash, erythema, pallor or cyanosis  Psychiatric: normal mood and affect. Behavior is normal.   Neurological: Alert. Grossly intact.      Course  MDM      Impression/Plan: Gerrad [name] is a 46 y.o. male presenting headache concerning for migraine vs. irretractable migraine vs. Other acute intracranial process.  Will give benadryl, Reglan, and Toradol to treat headache.    Orders Placed This Encounter   . C-REACTIVE PROTEIN(CRP),INFLAMMATION   . SEDIMENTATION RATE   . CBC/DIFF   . BASIC METABOLIC PANEL, NON-FASTING   . MAGNESIUM   . PHOSPHORUS   . PT/INR - X 3 DAYS   . PTT (PARTIAL THROMBOPLASTIN TIME) - X 3 DAYS   . URINALYSIS WITH CULTURE   . ALT (SGPT) - TOMORROW   . AST (SGOT) - TOMORROW   . OT EVALUATE AND TREAT (INPATIENT ONLY)   . PT  EVALUATE AND TREAT (INPATIENT ONLY)   . ECG 12-LEAD   . INSERT & MAINTAIN PERIPHERAL IV ACCESS   . PATIENT CLASS/LEVEL OF CARE DESIGNATION   . diphenhydrAMINE (BENADRYL) 50 mg/mL injection   . metoclopramide (REGLAN) 5 mg/mL injection   . ketorolac (TORADOL) 30 mg/mL injection   . metoclopramide HCl (REGLAN) injection ---[employer]   . ketorolac (TORADOL) injection ---[employer]   . diphenhydrAMINE (BENADRYL) injection ---Jaymes Graff   . NS premix infusion   . HYDROmorphone (PF) (DILAUDID) injection ---Jaymes Graff   . HYDROmorphone (DILAUDID) 1 mg/mL injection   . metoclopramide (REGLAN) 5 mg/mL injection   . diphenhydrAMINE (BENADRYL) 50 mg/mL injection   . methylPREDNISolone (SOLU-MEDROL) 250 mg in NS 50 mL IVPB   . magnesium sulfate 1 G in D5W 100 mL premix IVPB   . ketorolac (TORADOL) 30 mg/mL injection   . NS premix infusion   . heparin 5,000 unit/mL injection 5,000 Units   . esomeprazole (NEXIUM) capsule       Labs Reviewed   C-REACTIVE PROTEIN(CRP),INFLAMMATION   SEDIMENTATION RATE   CBC/DIFF   BASIC METABOLIC PANEL, NON-FASTING   MAGNESIUM   PHOSPHORUS   PT/INR   PTT (PARTIAL THROMBOPLASTIN TIME)   URINALYSIS WITH CULTURE REFLEX   ALT (SGPT)   AST (SGOT)     All labs were reviewed.    Medical Records reviewed.    4:13 PM  Reglan, benadryl, and Toradol given.  Pt reports no improvement.  Consulted neurology.      Pt admitted to neurology for further evaluation and monitoring.      I have reviewed and verified the information in the above note is an accurate dictation of the information supplied to [name], ED Scribe.      Standley Dakins, FNP 08/15/2012, 8:32 AM

## 2012-08-11 LAB — MAGNESIUM
MAGNESIUM: 1.9 mg/dL (ref 1.7–2.5)
MAGNESIUM: 2.1 mg/dL (ref 1.7–2.5)

## 2012-08-11 LAB — PTT (PARTIAL THROMBOPLASTIN TIME): APTT: 24.7 s (ref 22.0–32.0)

## 2012-08-11 LAB — PT/INR: INR: 1.1 (ref 0.8–1.2)

## 2012-08-11 LAB — PHOSPHORUS
PHOSPHORUS: 2.9 mg/dL (ref 2.4–4.7)
PHOSPHORUS: 3.4 mg/dL (ref 2.4–4.7)

## 2012-08-11 MED ORDER — VERAPAMIL ER 180 MG 24 HR CAPSULE,EXTENDED RELEASE
180.0000 mg | ORAL_CAPSULE | Freq: Every day | ORAL | Status: DC
Start: 2012-08-11 — End: 2015-03-24

## 2012-08-11 MED ORDER — VERAPAMIL ER (SR) 120 MG TABLET,EXTENDED RELEASE
120.0000 mg | ORAL_TABLET | Freq: Every day | ORAL | Status: DC
Start: 2012-08-11 — End: 2013-03-06

## 2012-08-11 NOTE — Nurses Notes (Signed)
Pt discharged to home in stable condition with family and belongings per orders.  IV and Teley removed per protocol and teley tele-tracked back to 10 east.  Discharge teaching and instructions reviewed with pt and his wife by myself and Angie Everet Rn.  Pt nor wife have any questions or concerns at this time.  Return to work slip provided to pt to return on Monday.  Pt transported to ER exit via wheelchair accompanied by wife and transport associate.

## 2012-08-11 NOTE — Care Plan (Signed)
Problem: General Plan of Care(Adult,OB)  Goal: Plan of Care Review(Adult,OB)  The patient and/or their representative will communicate an understanding of their plan of care   Outcome: Ongoing (see interventions/notes)  Pt resting well throughout shift. Pt pain controlled per MD orders. Pt neuro status unchanged. Safety precautions in place and maintained.

## 2012-08-11 NOTE — Care Plan (Signed)
Problem: General Plan of Care(Adult,OB)  Goal: Plan of Care Review(Adult,OB)  The patient and/or their representative will communicate an understanding of their plan of care   Outcome: Ongoing (see interventions/notes)  Pt cooperative to participate in PT eval.  6/10 headache pain pre and post tx.  Pt's strength was good to testing.  He appears to have general malaise.  S for transfers and gait in hallway with pt having handhold to IV pole.  No safety concerns from mobility standpoint for d/c to home.  No acute PT needs identified.      Barriers to Discharge: none  Goals:   None in acute setting  Discharge Needs:   Equipment Recommendations: None Anticipated  Further Treatment Recommendations: Home

## 2012-08-11 NOTE — PT Evaluation (Signed)
[facility]  Physical Therapy Initial Evaluation    Patient Name: Spencer [name]  Date of Birth: 1966/08/22  Height:  177.8 cm (5\' 10" )  Weight: 97.523 kg (215 lb)  Room/Bed: 959/A  Payor: BLUE CROSS BLUE SHIELD  Plan: FEDERAL BLUE CROSS  Product Type: PPO       Date/Time of Admission: 08/10/2012  1:07 PM  Admitting Diagnosis:  There are no admission diagnoses documented for this encounter.      HPI:     Spencer [name] is a 46 y.o. male with PMH of IBS, chest pain - takes prn NTG, normal cardiac cath in jan/2009, myocardial bridging, DM - not on meds and migraines presented with headache.    Precautions: none     Past Medical History   Diagnosis Date   . Colitis    . 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 Migraines    . Reflux    . Chest pain 12/23/2007   . Borderline diabetes mellitus 12/23/2007   . Spells 06/16/2008   . Stress test 05/28/2010   . S/P cardiac catheterization 05/28/2010   . PAD (peripheral artery disease) 06/03/2010   . Hypertriglyceridemia 06/03/2010   . Anginal pain 06/03/2010   . CAD (coronary artery disease) 06/03/2010   . Crohn's disease 06/14/2010   . IBS (irritable bowel syndrome) 06/14/2010   . GERD (gastroesophageal reflux disease) 06/14/2010   . Hyperlipidemia 12/26/2007     denies cp sob or mi hx   . Post traumatic stress disorder (PTSD)      currently being evaluated   . Arthritis      spine   . HTN    . 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 BPH (benign prostatic hyperplasia) 07/15/2011   . CVA (cerebrovascular accident) 12/2007   . Wears glasses      Past Surgical History   Procedure Laterality Date   . Pb colonoscopy,biopsy     . Hx laparotomy     . Hx heart catheterization     . Colonoscopy  09/24/2010     COLONOSCOPY performed by [name] at [facility] OR ENDO      reports that he has never smoked. He has never used smokeless tobacco. He reports that he does not drink alcohol or use illicit drugs.  History   Smoking status    . Never Smoker    Smokeless tobacco   . Never Used         Subjective:   Flowsheet Info:    08/11/12 0841   Therapist Pager   PT Assigned/ Pager # [phone]   Mutuality/Individual Preferences   !!What Anxieties, Fears or Concerns Do You Have About Your Health or Care? 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   Lives With spouse;child(ren), dependent   Living Arrangements house   Home Accessibility stairs within home   Living Environment Comment 2 story home, no concerns   Functional Level Prior   Ambulation 0-->independent   Transferring 0-->independent   Toileting 0-->independent   Prior Functional Level Comment indep with all mobility tasks   Change In Functional Status Since Onset of Current Illness/Injury no   Self-Care   Equipment Currently Used At Home no   Pain/Comfort   PreTreatment Rating (Numbers Scale) 6/10   PostTreatment Rating (Numbers Scale) 6/10   Pain  Comment (Pre/Post Treatment Pain) headache   Musculoskeletal Interventions   Activity/Level of Assistance up in hall;up in room;ambulated;with stand by assist   Ambulation Distance (Feet) 125    Symptoms Noted During/After Activity none   Positioning right side  (going back to sleep)   Coping/Psychosocial Response Assessments   Observed Emotional State calm;cooperative   Verbalized Emotional State acceptance   Coping/Psychosocial Response Interventions   Plan of Care Reviewed With patient       Patient Goals: Relieve headache, return home      Objective:     Cognition: Awake and Cooperative  Follows Directions: Complex      ROM  Left Right   Shoulder  [facility]  [facility]    Elbow  [facility]  WFL    Wrist/Hand  [facility]  [facility]    Hip  [facility]  [facility]    Knee  [facility]  [facility]    Ankle  [facility] WFL       STRENGTH  Left Right   Shoulder  4 (Good) 4 (Good)   Elbow  4 (Good) 4 (Good)   Wrist/Hand  4 (Good) 4 (Good)   Hip Flexion 4 (Good) 4 (Good)    Extension 4 (Good) 4 (Good)   Knee Flexion 4 (Good) 4 (Good)     Extension 4 (Good) 4 (Good)   Ankle DF 4 (Good) 4 (Good)    PF 4 (Good) 4 (Good)       Bed Mobility: supine to sit independent  Transfers: sit to stand supervision  Balance: Sitting good, standing fair+  Ambulation: Pt amb within room and hallway ~125.  Handhold to IV pole.  Pt with no overt balance impairment.  Pt reports fatigue secondary to not sleeping well last night.  Returned to room and to bed.  Other: Pt instructed that he has no activity restrictions, and encouraged him to be up and ambulating and sitting OOB for meals.    Assessment:   Pt cooperative to participate in PT eval.  6/10 headache pain pre and post tx.  Pt's strength was good to testing.  He appears to have general malaise.  S for transfers and gait in hallway with pt having handhold to IV pole.  No safety concerns from mobility standpoint for d/c to home.  No acute PT needs identified.      Barriers to Discharge: none      Goals:   None in acute setting      Discharge Needs:   Equipment Recommendations: None Anticipated  Further Treatment Recommendations: Home    Plan:   Current Intervention:No Intervention Needed, D/C from PT     The risks/benefits of therapy have been discussed with the patient/caregiver and he/she is in agreement with the established plan of care.     Therapist:   Daphene Calamity, PT 08/11/2012 8:44 AM  Pager #: [phone]  Evaluation Time: 25 minutes  Time may include review of medical chart, obtaining patient's functional history from patient/family/medical staff/case management/ancillary personnel, collaboration on findings and treatment options (with the above mentioned individuals), re-assessment, and acute care rehabilitation.

## 2012-08-11 NOTE — Nurses Notes (Signed)
 Pt arrived to room 959 via cart from the ED . Pt in stable condition . Assessment per flow sheet . Will continue to monitor.

## 2012-08-11 NOTE — OT Treatment (Signed)
 Occupational Therapy Note    OT order received and chart reviewed. Spoke with patient at bedside who reports full independence with self-care, functional transfers and functional mobility. Patient declines need for OT and denies concern regarding discharge home. OT to sign off, please re-consult if needed. Thank you.     Kynslie Ringle L DeBardi, MOT, OTR/L, pager [phone]

## 2012-08-11 NOTE — Speech Evaluation (Signed)
[facility]  Speech Therapy Swallow Evaluation    Patient Name: Spencer [name]  Date of Birth: [birth year]  Weight:  97.523 kg (215 lb)   Room/Bed: 959/A  Payor: BLUE CROSS BLUE SHIELD  Plan: FEDERAL BLUE CROSS  Product Type: PPO      Date/Time of Admission: 08/10/2012  1:07 PM  Admitting Diagnosis:  There are no admission diagnoses documented for this encounter.    HPI:   Spencer [name] is a 46 y.o. male     Past Medical History   Diagnosis Date   . Colitis    . 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 Migraines    . Reflux    . Chest pain 12/23/2007   . Borderline diabetes mellitus 12/23/2007   . Spells 06/16/2008   . Stress test 05/28/2010   . S/P cardiac catheterization 05/28/2010   . PAD (peripheral artery disease) 06/03/2010   . Hypertriglyceridemia 06/03/2010   . Anginal pain 06/03/2010   . CAD (coronary artery disease) 06/03/2010   . Crohn's disease 06/14/2010   . IBS (irritable bowel syndrome) 06/14/2010   . GERD (gastroesophageal reflux disease) 06/14/2010   . Hyperlipidemia 12/26/2007     denies cp sob or mi hx   . Post traumatic stress disorder (PTSD)      currently being evaluated   . Arthritis      spine   . HTN    . 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 BPH (benign prostatic hyperplasia) 07/15/2011   . CVA (cerebrovascular accident) 12/2007   . Wears glasses      Past Surgical History   Procedure Laterality Date   . Pb colonoscopy,biopsy     . Hx laparotomy     . Hx heart catheterization     . Colonoscopy  09/24/2010     COLONOSCOPY performed by [name] at [facility] OR ENDO      reports that he has never smoked. He has never used smokeless tobacco. He reports that he does not drink alcohol or use illicit drugs.  History   Smoking status   . Never Smoker    Smokeless tobacco   . Never Used     Subjective:      08/11/12 0950   Therapist Pager   SLP Pager [place] [phone]   Mutuality/Individual Preferences    !!What Anxieties, Fears or Concerns Do You Have About Your Health or Care? None stated   Functional Level Prior   Eating 0-->independent   Communication 0-->understands/communicates without difficulty   Swallowing 0-->swallows foods and liquids without difficulty   Change In Functional Status Since Onset of Current Illness/Injury no   Pain/Comfort   Pain Comment (Pre/Post Treatment Pain) reported HA as 2 or 3 / 10   Coping/Psychosocial Response Assessments   Observed Emotional State calm;cooperative   Coping/Psychosocial Response Interventions   Plan of Care Reviewed With patient;mother   Pertinent Diagnosis: 46 y.o. Male presents with migraine.  Patient's Goal: To swallow safely   Awake, alert, sitting up in bed.  Mother present.  Agreeable to evaluation at this time.    Objective:   Oxygenation: N/A   Oral Cavity: Clear and Moist   Oral Care:Deferred   Dentition: Upper Natural Dentition and Lower Natural Dentition   Lingual/Labial: WFL   Presented: Enis Gash and Water   Oral Phase: A/P Transit WFL, Bolus Organization WFL and Mastication Good   Pharyngeal Phase: Laryngeal Elevation  WFL and Swallow Response Timely   Deep Suctioning: N/A   Is Patient Confused/Nonverbal & Unable to Verbalize Responses to Pain   Evaluation? No  c/o HA as 2 or 3 out of 10     Assessment:   Impressions: No Overt S/S of Aspiration in this Setting with consistencies presented.  Oral and pharyngeal phases of swallow WFL for regular consistency diet.  Speech/Language/Cognition: WFL    Plan:   Recommendations:Begin Diet:  Regular Consistency Solids, Regular Liquids and Meds Whole as tolerated.  Results & Recommendations Discussed With:Patient and Mother  No further Speech and Swallow services warranted in this setting.  Will sign off.  Re-consult as warranted.  Thank you for this consult.  The risks/benefits of therapy have been discussed with the patient/caregiver and he/she is in agreement with the established plan of care.      Therapist:   Rogers Blocker, SLP 08/11/2012 11:02 AM  Pager #: [phone]  Evaluation Time: 14 minutes  Time may include review of medical chart, obtaining patient's functional history from patient/family/medical staff/case management/ancillary personnel, collaboration on findings and treatment options (with the above mentioned individuals), re-assessment, and acute care rehabilitation.

## 2012-08-11 NOTE — Care Plan (Signed)
Problem: General Plan of Care(Adult,OB)  Goal: Plan of Care Review(Adult,OB)  The patient and/or their representative will communicate an understanding of their plan of care   Outcome: Adequate for Discharge Date Met:  08/11/12  All goals adequate for discharge to home with family in stable condition at this time.  Pt states that pain is head is improving since medication has been given.  Pt received a dose of IV solu medrol, IV Magnesium sulfate piggyback, IV benadryl, and IV Reglan prior to discharge.  Pt provided with handout educational sheets on headaches, migraines, hypertension, solu medrol, and new medication of verapamil to read over at home.  Pt tolerating regular diet with no C/O N/V.  Pt able to ambulate on own with steady gait.  Pt able to use restroom with no problems.  Pt and wife aware of follow up appointment with Neurology in 8 weeks.  And also aware to call MD or go to the ER if symptoms should get worst or return in full.

## 2012-08-11 NOTE — Progress Notes (Addendum)
[facility]  Neurology Progress Note      Spencer [name],Spencer [name], 46 y.o. male  Date of Admission:  08/10/2012  Date of service: 08/11/2012  Date of Birth:  1965-12-10      Chief Complaint: Headache  Pt's condition today: improving    Subjective: Patient reports his headache to be 3/10 and denies any numbness and tingling. He reports a lot of improvement in his weakness as well.     Vital Signs:  Temp (24hrs) Max:36.6 C (97.9 F)      Systolic (24hrs), Avg:126 mmHg, Min:104 mmHg, Max:138 mmHg    Diastolic (24hrs), Avg:72 mmHg, Min:64 mmHg, Max:76 mmHg    Temp  Avg: 36.5 C (97.7 F)  Min: 36.4 C (97.5 F)  Max: 36.6 C (97.9 F)  Pulse  Avg: 73.5   Min: 60   Max: 88   Resp  Avg: 18.8   Min: 16   Max: 20   SpO2  Avg: 98 %  Min: 96 %  Max: 100 %  Pain Score (Numeric, Faces): 9    Today's Physical Exam:  General:alert  Mental status:Alert and oriented x 3  Memory: Registration, Recall, and Following of commands is normal  Attention: Attention and Concentration are normal  Knowledge: Good  Language and Speech: Normal and Normal  Cranial nerves: Cranial nerves 2-12 are normal  Muscle tone: WNL  Motor strength:  Motor strength is normal throughout except 4+/5 in lower extremities.  Sensory: Sensory exam in the upper and lower extremities is normal  Gait: Normal  Coordination: Coordination is normal without tremor  Reflexes: Reflexes are 2/2 throughout    Current Medications:    Current Facility-Administered Medications:  NS premix infusion Active  Intravenous Continuous   metoclopramide (REGLAN) 5 mg/mL injection Active 10 mg Intravenous Q8H   diphenhydrAMINE (BENADRYL) 50 mg/mL injection Active 25 mg Intravenous Q8H   magnesium sulfate 1 G in D5W 100 mL premix IVPB Active 1 g Intravenous Q8H   NS premix infusion Active  Intravenous Continuous   heparin 5,000 unit/mL injection 5,000 Units Active 5,000 Units Subcutaneous Q8HRS   esomeprazole (NEXIUM) capsule Active 40 mg Oral Daily before Breakfast    prochlorperazine (COMPAZINE) 5 mg/mL injection Active 10 mg Intravenous Q8H   methylPREDNISolone (SOLU-MEDROL) 250 mg in NS 50 mL IVPB Active 250 mg Intravenous Q12H   ketorolac (TORADOL) 30 mg/mL injection Active 30 mg Intravenous Q8HRS   aspirin tablet 325 mg Active 325 mg Oral Daily   isosorbide mononitrate (IMDUR) 24 hr extended release tablet Active 30 mg Oral Daily   metoprolol tartrate (LOPRESSOR) tablet Active 25 mg Oral 2x/day   rosuvastatin (CRESTOR) tablet Active 20 mg Oral QPM   nitroglycerin (NITROSTAT) sublingual tablet Active 0.4 mg Sublingual Q5 Min PRN   gabapentin (NEURONTIN) capsule 400 mg Active 400 mg Oral QPM       I/O:  I/O last 24 hours:    Intake/Output Summary (Last 24 hours) at 08/11/12 0803  Last data filed at 08/11/12 0539   Gross per 24 hour   Intake    735 ml   Output    925 ml   Net   -190 ml     I/O current shift:       Labs  Please indicate ordered or reviewed)  Reviewed:   Lab Results for Last 24 Hours:    Results for orders placed during the hospital encounter of 08/10/12 (from the past 24 hour(s))   C-REACTIVE PROTEIN(CRP),INFLAMMATION  Result Value Range    C-REACTIVE PROTEIN HIGH SENSITIVITY (INFLAMMATION) 0.364  <0.800 mg/dL   SEDIMENTATION RATE       Result Value Range    SEDIMENTATION RATE 10  0 - 15 mm/hr   CBC/DIFF       Result Value Range    WBC 6.1  3.5 - 11.0 THOU/uL    RBC 4.51  4.06 - 5.63 MIL/uL    HGB 13.5  12.5 - 16.3 g/dL    HCT 91.4  78.2 - 95.6 %    MCV 88.0  78 - 100 fL    MCH 30.0  27.4 - 33.0 pg    MCHC 34.1  32.5 - 35.8 g/dL    RDW 21.3  08.6 - 57.8 %    PLATELET COUNT 177  140 - 450 THOU/uL    MPV 8.6  7.5 - 11.5 fL    PMN'S 52      PMN ABS 3.100  2.0 - 6.5 THOU/uL    LYMPHOCYTES 40      LYMPHS ABS 2.500  1.5 - 3.5 THOU/uL    MONOCYTES 6      MONOS ABS 0.400  0.3 - 1.0 THOU/uL    EOSINOPHIL 2      EOS ABS 0.100  0.0 - 0.5 THOU/uL    BASOPHILS 0      BASOS ABS 0.000  0.0 - 0.2 THOU/uL   BASIC METABOLIC PANEL, NON-FASTING       Result Value Range     SODIUM 139  136 - 145 mmol/L    POTASSIUM 4.1  3.5 - 5.1 mmol/L    CHLORIDE 105  96 - 111 mmol/L    CARBON DIOXIDE 27  23 - 33 mmol/L    ANION GAP 7  5 - 16 mmol/L    CREATININE 1.01  0.62 - 1.27 mg/dL    ESTIMATED GLOMERULAR FILTRATION RATE >59  >59 ml/min/1.35m2    GLUCOSE,NONFAST 92  65 - 139 mg/dL    BUN 11  8 - 26 mg/dL    BUN/CREAT RATIO 11  6 - 22    CALCIUM 8.7  8.5 - 10.4 mg/dL   AST (SGOT)       Result Value Range    AST (SGOT) 17  8 - 48 U/L   ALT (SGPT)       Result Value Range    ALT (SGPT) 17  7 - 55 U/L   MAGNESIUM       Result Value Range    MAGNESIUM 1.9  1.7 - 2.5 mg/dL   PHOSPHORUS       Result Value Range    PHOSPHORUS 3.4  2.4 - 4.7 mg/dL   ALT (SGPT)       Result Value Range    ALT (SGPT) 16  7 - 55 U/L   AST (SGOT)       Result Value Range    AST (SGOT) 21  8 - 48 U/L   PT/INR       Result Value Range    PROTHROMBIN TIME 11.1  9.1 - 12.5 Sec    INR 1.1  0.8 - 1.2   PTT (PARTIAL THROMBOPLASTIN TIME)       Result Value Range    APTT 24.7  22.0 - 32.0 Sec       Review of reports and notes reveal:     Independent Interpretation of images or specimens:  None to review in this admission  Patient/ Family Discussion: Plan discussed with family    Assessment/Plan:  Active Hospital Problems    Diagnosis   . Primary Problem: Migraine Headache     46 year old male with PMH of Migraine headache presented with severe throbbing headache. His headache improved during course of hospitalization.   Migraine headache   - Continue IV solumedrol, Reglan, Magnesium, Toradol    - Vitals, neuro checks   - Prescribed him Verapamil for migraine prophylaxis.     HTN   - Continue metoprolol, Imdur     HPL   -Continue crestor     DM   - Not on meds   - monitor fingersticks.   - For stroke risk reduction, we continued him on ASA 325 mg daily for now, BP medications for goal < 130/80 mm Hg, statin for goal LDL < 70, and Diabetes medications for goal HbA1c < 7.0 %, regular diabetic screening, Ophthalmology consult, regular foot examination, micro-albumin in urine and good glycemic control. Encouraged weight loss, a low fat, low carbohydrate, low sodium diet, and daily exercise.    CAD   -stable   -ASA, beta blocker   -NTG prn   DVT/PE Prophylaxis: Heparin     Sheria Lang, MD 08/11/2012, 8:03 AM      I saw and examined the patient.  I reviewed the resident's note.  I agree with the findings and plan of care as documented in the resident's note.  Any exceptions/additions are edited/noted.    Norva Riffle, MD 08/11/2012, 8:12 PM

## 2012-08-11 NOTE — Care Plan (Signed)
Problem: General Plan of Care(Adult,OB)  Goal: Plan of Care Review(Adult,OB)  The patient and/or their representative will communicate an understanding of their plan of care   Outcome: Ongoing (see interventions/notes)  Assessment:   Impressions: No Overt S/S of Aspiration in this Setting with consistencies presented.  Oral and pharyngeal phases of swallow WFL for regular consistency diet.  Speech/Language/Cognition: WFL      Plan:   Recommendations:Begin Diet:  Regular Consistency Solids, Regular Liquids and Meds Whole as tolerated.  Results & Recommendations Discussed With:Patient and Mother  No further Speech and Swallow services warranted in this setting.  Will sign off.  Re-consult as warranted.  Thank you for this consult.  The risks/benefits of therapy have been discussed with the patient/caregiver and he/she is in agreement with the established plan of care.

## 2012-08-11 NOTE — Discharge Summary (Addendum)
DISCHARGE SUMMARY      PATIENT NAME:  Spencer [name], Spencer [name]  MRN:  [id number]  DOB:  [birth year]    ADMISSION DATE:  08/10/2012  DISCHARGE DATE:  08/13/2012    ATTENDING PHYSICIAN: Norva Riffle, MD  PRIMARY CARE PHYSICIAN: [name], DO     CHIEF COMPLAINT:    Chief Complaint   Patient presents with   . Migraine Headache     Pt developed a headache on Wednesday.  Pt has been taking prescribed medication with no relief.     DISCHARGE DIAGNOSIS:     Principle Problem: Migraine Headache  Active Hospital Problems    Diagnosis Date Noted   . Principle Problem: Migraine Headache 08/10/2012      Resolved Hospital Problems    Diagnosis    No resolved problems to display.     Active Non-Hospital Problems    Diagnosis Date Noted   . Headache 08/10/2012   . Unconjugated hyperbilirubinemia 11/11/2011   . 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 BPH (benign prostatic hyperplasia) 07/15/2011   . Abdominal pain 07/15/2011   . Nausea and vomiting 07/15/2011   . Microhematuria 12/25/2010   . Crohn's disease 06/14/2010   . IBS (irritable bowel syndrome) 06/14/2010   . GERD (gastroesophageal reflux disease) 06/14/2010   . PAD (peripheral artery disease) 06/03/2010   . Hypertriglyceridemia 06/03/2010   . Anginal pain 06/03/2010   . CAD (coronary artery disease) 06/03/2010   . Stress test 05/28/2010   . S/P cardiac catheterization 05/28/2010   . Spells 06/16/2008   . Hyperlipidemia 12/26/2007   . Borderline diabetes mellitus 12/23/2007      DISCHARGE MEDICATIONS:  Current Discharge Medication List      START taking these medications    Details   verapamil (CALAN SR) 180 mg Oral Cap,24 hr Sust Release Pellets Take 1 Cap (180 mg total) by mouth Once a day  Qty: 60 Cap, Refills: 3         CONTINUE these medications which have NOT CHANGED    Details   SUMATRIPTAN SUCC/NAPROXEN SOD (TREXIMET ORAL) Take by mouth      isosorbide mononitrate (IMDUR) 30 mg Oral Tablet Sustained Release 24 hr Take 1 Tab (30 mg total) by mouth Once a day   Qty: 90 Tab, Refills: 3    Associated Diagnoses: Myocardial bridge; CAD (coronary artery disease); Anginal pain      Hyoscyamine Sulfate (ANASPAZ, LEVSIN) 0.125 mg Oral Tablet take 1 Tab by mouth Every 6 hours as needed. Please give sublingual form  Qty: 30 Tab, Refills: 1    Associated Diagnoses: IBS (irritable bowel syndrome)      DOXEPIN HCL (DOXEPIN ORAL) Take 25 mg by mouth Once per day as needed       metoprolol (LOPRESSOR) 25 mg Oral Tablet take 1 Tab by mouth Twice daily.  Qty: 60 Tab, Refills: 5      rosuvastatin (CRESTOR) 20 mg Oral Tablet take 1 Tab by mouth QPM.  Qty: 30 Tab, Refills: 5      Gabapentin (NEURONTIN) 400 mg Oral Capsule take 1 Cap by mouth every night.  Qty: 30 Cap, Refills: 0      aspirin 325 mg Tab take 325 mg by mouth Once a day.      Acetaminophen 650 mg Oral Tablet Take 1 Tab (650 mg total) by mouth Every 6 hours for 30 days  Qty: 120 Tab, Refills: 0      nitroglycerin (NITROSTAT) 0.4 mg Sublingual Tablet, Sublingual for  3 doses over 15 minutes  Qty: 25 Tab, Refills: 3    Associated Diagnoses: Myocardial bridge; CAD (coronary artery disease); Anginal pain           DISCHARGE INSTRUCTIONS:     SCHEDULE FOLLOW-UP PHYSICIANS OFFICE [facility]   Follow-up appointment clinic: NEUROLOGY    Follow-up in: 8 WEEKS    Reason for visit: HOSPITAL DISCHARGE    Followup reason: hospital discharge    Provider: Delmar Landau, MD      RETURN TO WORK/SCHOOL   Patient headache getting better   Patient May Return to Work: 08/13/2012         REASON FOR HOSPITALIZATION AND HOSPITAL COURSE:     This is a 46 y.o., male patient with past medical history of IBS, chest pain (takes prn NTG), normal cardiac cath in January 2009, myocardial bridging, DM (not on medication) and migraine headache presented on 08/10/2012 with a headache that started last Wednesday. He had tried trexemet, excedrin, tylenol, benadryl and all of it did not help. He complained of generalized headache, throbbing associated with nausea, generalized weakness, lightheadedness and light/sound sensitivity. He noted that he has had migraine since 2006 and gets headache 1-2 times per month. Patient had CT brain and CTA done in August 2013 and were unremarkable. He was seen by neurology that time and his headaches later improved and he was discharged on zanaflex. However, patient did not remember using Zanaflex. He denied any chest pain, speech problem, gait problem and vision problem on admission.  We gave him IV solumedrol, Reglan, Magnesium and Toradol during his hospital course. His headache improved. We discharged him with prescription for prophylaxis of migraine i.e Verapamil. We continued his home dose of blood pressure medication, aspirin and statin. Follow-up is made in residents clinic.     Discharge day exam:  General: alert sitting in bed.   Mental status:Alert and oriented x 3   Memory: Registration, Recall, and Following of commands is normal   Attention: Attention and Concentration are normal   Knowledge: Good   Language and Speech: Normal and Normal   Cranial nerves: Cranial nerves 2-12 are normal   Muscle tone: WNL   Motor strength: Motor strength is normal throughout except 4+/5 in lower extremities.   Sensory: Sensory exam in the upper and lower extremities is normal   Gait: Normal   Coordination: Coordination is normal without tremor   Reflexes: Reflexes are 2/2 throughout      CONDITION ON DISCHARGE:  A. Ambulation: Full ambulation  B. Self-care Ability: Complete  C. Cognitive Status Alert and Oriented x 3     DISCHARGE DISPOSITION:  Home discharge     cc: Primary Care Physician:  [name], DO  [address]  [facility] [place] [address]     EX:[id number] Physician:  No referring provider defined for this encounter.   Referring providers can utilize [email or web address] to access their referred [employer] patient's information.    Sheria Lang, MD 08/13/2012, 12:20 PM    Norva Riffle, MD 08/13/2012, 2:15 PM

## 2012-08-13 NOTE — Care Management Notes (Signed)
Utilization Review Determination    SECTION I  Reason for Physician Advisor Referral: Does not meet screening criteria for admission. 08/10/12  Current [facility] MD Order:INPT  Current [facility] ADT Order: INPT  Utilization Review Findings: OBS  Additional information available for medical team: Yes    Utilization Review MD: [facility]. [facility]  Based on clinical information available to me as per above and in chart, the patient's status should be: Observation    Utilization Review RN: Lavon Paganini                                Date/Time: 08/13/12 1000AM   -------------------------------------------------------------------------------------------------------------------  1.  I have reviewed this case with the patient's treating physician DR. HEINZ and the MD agrees with this change in status.  They are aware of the referral to the Utilization Review Committee.      4.  [facility] order changed: Yes       OBS Database: No       UDR Doc in Allscripts: No    5.  The patient has been notified on N/A ALREADY D/C'D of any changes in level of care.        Patient Notification Doc in Allscripts:No    Utilization Review RN: Lavon Paganini                                   Date/Time: 08/13/12 1000AM   (Patient notification is required only if the status is changed while an Inpatient to Observation Status)  -------------------------------------------------------------------------------------------------------------------

## 2012-08-14 ENCOUNTER — Other Ambulatory Visit (INDEPENDENT_AMBULATORY_CARE_PROVIDER_SITE_OTHER): Payer: Self-pay | Admitting: Physical Medicine & Rehabilitation

## 2012-10-11 ENCOUNTER — Encounter (INDEPENDENT_AMBULATORY_CARE_PROVIDER_SITE_OTHER): Payer: Self-pay | Admitting: Neurology

## 2012-10-11 ENCOUNTER — Encounter (INDEPENDENT_AMBULATORY_CARE_PROVIDER_SITE_OTHER): Admitting: Neurology

## 2012-10-30 ENCOUNTER — Ambulatory Visit (INDEPENDENT_AMBULATORY_CARE_PROVIDER_SITE_OTHER): Admitting: Internal Medicine

## 2012-10-30 NOTE — Progress Notes (Signed)
Spencer [name] was a NO SHOW for his appointment today.

## 2012-10-31 NOTE — Letter (Signed)
[facility]  [address] [place], [place] [address]    PATIENT NAME: Spencer [name], Spencer [name]  CHART NUMBER: [id number]  DATE OF BIRTH: 1965-12-28  DATE OF SERVICE: 10/30/2012    [facility]  [address]  [place], [place]  [address]      October 30, 2012    Mr. [address]  [address]  [place], [place] [address]     Dear Mr. Criado:    You were scheduled to see [place] in the office on October 30, 2012, at our [place] office.  You were scheduled for a followup for your heart disease and high blood pressure.  The last note that I can find from our clinic was from Apr 25, 2012.    I hope that you are doing well.  Please call our office at your earliest convenience to reschedule your appointment.  I hope that everything is going well for you.    Sincerely,      Trina Ao, MD  Assistant Professor, Section of Cardiology  [facility] Department of Medicine    [id number]; D: 10/30/2012 15:44:58; T: 10/30/2012 19:05:47

## 2013-01-31 ENCOUNTER — Encounter (INDEPENDENT_AMBULATORY_CARE_PROVIDER_SITE_OTHER): Admitting: Neurology

## 2013-02-11 ENCOUNTER — Encounter (INDEPENDENT_AMBULATORY_CARE_PROVIDER_SITE_OTHER): Payer: Self-pay | Admitting: Internal Medicine

## 2013-03-06 ENCOUNTER — Encounter (INDEPENDENT_AMBULATORY_CARE_PROVIDER_SITE_OTHER): Payer: Self-pay | Admitting: Internal Medicine

## 2013-03-06 ENCOUNTER — Ambulatory Visit (INDEPENDENT_AMBULATORY_CARE_PROVIDER_SITE_OTHER): Admitting: Internal Medicine

## 2013-03-06 VITALS — BP 124/82 | HR 68 | Resp 20 | Ht 70.0 in | Wt 214.0 lb

## 2013-03-06 MED ORDER — ROSUVASTATIN 20 MG TABLET
20.00 mg | ORAL_TABLET | Freq: Every evening | ORAL | Status: DC
Start: 2013-03-06 — End: 2013-12-19

## 2013-03-06 NOTE — Patient Instructions (Signed)
Restart Crestor and get fasting cholesterol labs in 4-6 weeks.

## 2013-03-06 NOTE — Progress Notes (Signed)
I personally saw and examined the patient. See mid-level's note for additional details. My findings are stable angina. Doing better on isosorbide.    Salley Scarlet, MD 03/06/2013, 8:43 AM

## 2013-03-06 NOTE — Letter (Signed)
[facility]  [address] [place], [place] [address]    PATIENT NAME: Spencer [name], Spencer [name]  CHART NUMBER: [id number]  DATE OF BIRTH: [birth year]  DATE OF SERVICE: 03/06/2013    [facility]  [address]  [place], [place]  [address]    March 06, 2013      Mr. Bland Rudzinski  [address]  [place], [place] [address]    To Whom It May Concern:    I have been seeing Mr. Lard in the Cardiology Clinic since July 2011.  The patient has a diagnosis of atherosclerotic heart disease, angina, myocardial bridging, hyperlipidemia, hypertriglyceridemia and borderline diabetes mellitus.  The patient has stable anginal symptoms.  The patient is doing well on medical therapy.  The patient needed documentation of his diagnoses for [employer] purposes.    If additional information is needed regarding his past medical history, please contact my office at [phone].    Sincerely,      Trina Ao, MD  Assistant Professor, Section of Cardiology  [facility] Department of Medicine    [id number]; D: 03/06/2013 08:58:33; T: 03/06/2013 09:24:38

## 2013-03-07 NOTE — Progress Notes (Addendum)
Name: Spencer [name]                       Date of Birth: [birth year]   MRN:  [id number]                         Date of visit: 03/06/2013     PCP: [name], DO     Subjective  Spencer [name] is a 47 y.o. year old male who presents for Chest Pain to clinic.  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  He admits that his symptoms improved with taking Imdur on a daily basis.  He does have occasional shortness of breath with exertion and fatigue.  Recently had a death in the family, and with that stress came some mild chest pressure and palpitations.  Admits that he has not been taking the Crestor for his cholesterol.  Denies dizziness, blurred vision, syncope.  Patient Active Problem List    Diagnosis Date Noted   . Headache 08/10/2012   . Unconjugated hyperbilirubinemia 11/11/2011   . 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 BPH (benign prostatic hyperplasia) 07/15/2011   . Abdominal pain 07/15/2011   . Nausea and vomiting 07/15/2011   . Microhematuria 12/25/2010   . Crohn's disease 06/14/2010   . IBS (irritable bowel syndrome) 06/14/2010   . GERD (gastroesophageal reflux disease) 06/14/2010   . PAD (peripheral artery disease) 06/03/2010   . Hypertriglyceridemia 06/03/2010   . Anginal pain 06/03/2010   . CAD (coronary artery disease) 06/03/2010   . Stress test 05/28/2010     10/09/09--@ [facility]-EF 69%.  Normal exercise stress and MPS.   12/24/07--@ [facility]-EF 65%.  Limited but probably normal exam due to patient motion.    . S/P cardiac catheterization 05/28/2010     12/25/07--@ [facility] revealed mild CAD with normal LVF. LAD w/prox to mid focal area of 10% stenosis, then it became a tortuous vessel in the mid portion with myocardial bridging noted in the mid-to-distal LAD.   Marland Kitchen Spells 06/16/2008     LOC in daytime and other spells of 10 sec shaking episodes in sleep   . Hyperlipidemia 12/26/2007   . Borderline diabetes mellitus 12/23/2007       Current Outpatient Prescriptions   Medication Sig   . aspirin 325 mg Tab take 325 mg by mouth Once a day.   Marland Kitchen DOXEPIN HCL (DOXEPIN ORAL) Take 25 mg by mouth Once per day as needed    . Gabapentin (NEURONTIN) 400 mg Oral Capsule take 1 Cap by mouth every night.   . Hyoscyamine Sulfate (ANASPAZ, LEVSIN) 0.125 mg Oral Tablet take 1 Tab by mouth Every 6 hours as needed. Please give sublingual form   . isosorbide mononitrate (IMDUR) 30 mg Oral Tablet Sustained Release 24 hr Take 1 Tab (30 mg total) by mouth Once a day   . metoprolol (LOPRESSOR) 25 mg Oral Tablet take 1 Tab by mouth Twice daily.   . nitroglycerin (NITROSTAT) 0.4 mg Sublingual Tablet, Sublingual for 3 doses over 15 minutes   . SUMATRIPTAN SUCC/NAPROXEN SOD (TREXIMET ORAL) Take 1 Tab by mouth Once per day as needed    . verapamil (CALAN SR) 180 mg Oral Cap,24 hr Sust Release Pellets Take 1 Cap (180 mg total) by mouth Once a day   REVIEW OF SYSTEMS:   General: no fever, chills, or weight changes. Positive for fatigue.  HEENT:  no vision changes.  Cardiac: Positive for chest pain, palpitations, no dizziness, light-headedness, or near syncope.  Resp: No dyspnea at rest, positive for occasional dyspnea on exertion; no cough or hemoptysis; no orthopnea or PND.  GI: No N/V. No melena or bright red blood per rectum.  Ext: No edema.  Neuro: No focal weakness or numbness.  Objective  Blood pressure 124/82, pulse 68, resp. rate 20, height 1.778 m (5\' 10" ), weight 97.07 kg (214 lb), SpO2 98.00%.   General: no acute distress and alert  Eyes: Sclera non-icteric.   HENT:Mouth mucous membranes moist.   Neck: No JVD, no carotid bruit.  Lungs: clear to auscultation bilaterally.   Cardiovascular:    Heart regular rate and rhythm, S1, S2 normal, no murmur, click, rub or gallop  Abdomen: soft, non-tender, non-distended and no hepatosplenomegaly  Extremities: extremities normal, atraumatic, no cyanosis or edema  Skin: Skin warm and dry and No rashes  Assessment/Plan   1. CAD (coronary artery disease)    2. Anginal pain    3. Myocardial Bridging    4. PAD (peripheral artery disease)    5. Hyperlipidemia    CAD with myocardial bridging.  Patient noticed an improvement in his symptoms with daily Imdur use.  He will continue aspirin, lopressor, SL NTG prn and verapamil as well as Imdur.  Stable symptoms.  Hyperlipidemia.  Patient admits he is not taking Crestor.  Refilled prescription for Crestor 20 mg daily.  Lab slip given for FLP/AST/ALT to be drawn in 4-6 weeks.  The patient should continue on maximum tolerable statin therapy.  LDL goal ideally <70.   Will follow up in 8 months or sooner as needed.    This patient was jointly seen and examined by Dr. Lavella Myren Ao, who agrees with the plan of care.    Royden Purl Jamison Neighbor, MS, PA-C  Physician Assistant-Certified  [facility]    I personally saw and examined the patient. See mid-level's note for additional details. My findings are stable angina. Doing better on isosorbide.   Salley Scarlet, MD 03/06/2013, 8:43 AM    Dineen Kid. Tingler, MD  Assistant Professor of Cardiology  [facility]

## 2013-05-17 ENCOUNTER — Encounter (INDEPENDENT_AMBULATORY_CARE_PROVIDER_SITE_OTHER): Admitting: Urology

## 2013-07-11 ENCOUNTER — Encounter (INDEPENDENT_AMBULATORY_CARE_PROVIDER_SITE_OTHER): Admitting: Neurology

## 2013-11-05 ENCOUNTER — Ambulatory Visit (INDEPENDENT_AMBULATORY_CARE_PROVIDER_SITE_OTHER): Admitting: Internal Medicine

## 2013-11-05 NOTE — Progress Notes (Signed)
 Date: 11/05/2013  Patient Name: Spencer [name]    Date of birth: 04-12-1966  Primary Care Provider: Charlie FORBES Hoh, DO     Patient was a NO SHOW for his appointment today.  Repeated no shows.    Alm Carlin Manson, MD 11/05/2013, 5:25 PM

## 2013-12-19 ENCOUNTER — Encounter (INDEPENDENT_AMBULATORY_CARE_PROVIDER_SITE_OTHER): Payer: Self-pay | Admitting: Neurology

## 2013-12-19 ENCOUNTER — Ambulatory Visit: Attending: Neurology | Admitting: Neurology

## 2013-12-19 DIAGNOSIS — K589 Irritable bowel syndrome without diarrhea: Secondary | ICD-10-CM | POA: Insufficient documentation

## 2013-12-19 DIAGNOSIS — G4700 Insomnia, unspecified: Secondary | ICD-10-CM | POA: Insufficient documentation

## 2013-12-19 DIAGNOSIS — I2510 Atherosclerotic heart disease of native coronary artery without angina pectoris: Secondary | ICD-10-CM | POA: Insufficient documentation

## 2013-12-19 DIAGNOSIS — R079 Chest pain, unspecified: Secondary | ICD-10-CM | POA: Insufficient documentation

## 2013-12-19 DIAGNOSIS — G43909 Migraine, unspecified, not intractable, without status migrainosus: Secondary | ICD-10-CM | POA: Insufficient documentation

## 2013-12-19 NOTE — Progress Notes (Addendum)
[facility]  [place] Department of Neurology                                       [facility] by [facility]  Return Outpatient Note      Date:  12/19/2013  Patient Name: Dolan AmenCharles Anthony Hebert  MRN: [id number]  Age:  48 y.o.  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 He said he was referred here by her PCP, [facility]min[facility] as he keeps having migraines. He is a Cytogeneticistveteran. He was seen by neurology as consult when he was admitted to medicine for chest pain and occipital headaches. He was admitted to neurology service on 08/10/2012 for acute migraine. He improved with  IV solumedrol, Reglan, Magnesium and Toradol. He was given verapamil for headache prophylaxis. He then never followed up with our clinic afterwards.   He said he has migraines since 2004. 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 He then went to college and was stress out. 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  He reported having migraine once a month. His headache lasts for 2 days if he catches it early, otherwise it lasts for >3 days. He takes motrin and benadryl at the onset of headache. If it doesn't work, then he will take Imitrex. His headache is usually frontal, pulsating, radiating to back of head and associated with nausea, photophobia and phonophobia. He had vomiting in the past. He says Imitrex helps him. Patient had CT brain and CTA done in August 2013 and they were unremarkable. Denied any triggers for migraines.  Currently he says he is not taking verapamil  regularly. He took it once this month. He is taking Neurontin 400 mg qhs due to muscle spasms in his neck. He doesn't know the dose of Imitrex he is taking.   He reported that he snores at night. He also reported poor sleep. However, he sleep of when he has day off or is on vacation.     PHYSICAL EXAM:    BP 106/73   Pulse 85   Ht 1.773 m (5' 9.8")   Wt 96.4 kg (212 lb 8.4 oz)   BMI 30.67 kg/m2    Appearance: No Acute Distress  Ophthalmoscopic: Disc Flat, Normal fundus  Carotid/Heart/Peripheral Vascular: No Bruits, RRR  Mental status:  Orientation: Awake, Alert, and Oriented x 3  Memory: Registation 3/3 Recall 3/3  Attention: Normal  Knowledge: Appropriate  Language: No aphasia  Speech: No dysarthria  Cranial Nerves:  2 No Visual Defect on Confrontation, Pupils round, equal, reactive to light  3,4,6 Extraocular Movements Intact, no nystagmus  5 Facial Sensation Intact  7 No facial asymmetry  8 Intact hearing  9,10 Palate symmetric, normal gag  11 Good shoulder shrug  12 Tongue Midline  Gait: Stable, no ataxia  Coordination: No ataxia with finger to nose testing, and heel to shin  Sensory: Intact, Symmetric  to pinprick, light touch, vibration, and joint position  Muscle Tone: Normal              Muscle exam:  Arm Right Left Leg Right Left   Deltoid 5/5 5/5 Iliopsoas 5/5 5/5   Biceps 5/5 5/5 Quads 5/5 5/5   Triceps 5/5 5/5 Hamstrings 5/5 5/5   Wrist Extension 5/5 5/5 Ankle Dorsi Flexion 5/5 5/5   Wrist Flexion 5/5 5/5 Ankle Plantar Flexion 5/5 5/5   Interossei 5/5 5/5 Ankle Eversion 5/5 5/5   APB 5/5 5/5 Ankle Inversion 5/5 5/5       Reflexes   RJ BJ TJ KJ AJ Plantars Hoffman's   Right 2+ 2+ 2+ 2+ 2+ Downgoing Not present   Left 2+ 2+ 2+ 2+ 2+ Downgoing Not present                    Outside records: None to review    Assessment/Plan:     ICD-9-CM   1. 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 Gets 1/month.  Episodic Migraines  - Advised to take Imitrex 100 mg at the onset of headache. Repeat the dose in 2 hours of if no relief. Do not take Imitrex >3 times a week.  - Advised maintaining sleep hygiene. No computer or cell phone for 1 hour prior to going to bed. Keep scheduled time for sleep.   - Avoid Caffeine, increase hydration.   - Advised to identify triggers for migraines and avoid them.  - Will schedule f/u appointment in 6 months. Advised patient to reschedule the appointment if needs to be seen earlier or later.    Vivien Rota, MD 12/20/2013, 5:45 AM    12/20/2013  I saw and examined the patient.  I reviewed the resident's note.  I agree with the findings and plan of care as documented in the resident's note.  Any exceptions/additions are noted.    I have given him a thorough explanation of headache etiology and their management including abortive and prophylactic therapy. Counseled mainly on abortive therapy with all headaches and not just the big ones.     Hosie Poisson, MD 12/20/2013, 11:28 AM

## 2013-12-31 ENCOUNTER — Ambulatory Visit (INDEPENDENT_AMBULATORY_CARE_PROVIDER_SITE_OTHER): Admitting: Internal Medicine

## 2013-12-31 DIAGNOSIS — Z029 Encounter for administrative examinations, unspecified: Secondary | ICD-10-CM

## 2013-12-31 NOTE — Progress Notes (Signed)
Date: 12/31/2013  Patient Name: Spencer [name]    Date of birth: Jun 07, 1966  Primary Care Provider: [name], DO     Patient was a NO SHOW for his appointment today.    Salley Scarlet, MD 12/31/2013, [phone]

## 2014-01-02 ENCOUNTER — Ambulatory Visit (HOSPITAL_BASED_OUTPATIENT_CLINIC_OR_DEPARTMENT_OTHER): Admitting: Urology

## 2014-01-02 ENCOUNTER — Ambulatory Visit
Admission: RE | Admit: 2014-01-02 | Discharge: 2014-01-02 | Disposition: A | Discharge status: Home / Self Care | Source: Ambulatory Visit | Attending: Urology | Admitting: Urology

## 2014-01-02 ENCOUNTER — Encounter (INDEPENDENT_AMBULATORY_CARE_PROVIDER_SITE_OTHER): Payer: Self-pay | Admitting: Urology

## 2014-01-02 VITALS — BP 118/72 | Temp 97.3°F | Ht 70.0 in | Wt 214.1 lb

## 2014-01-02 DIAGNOSIS — N200 Calculus of kidney: Secondary | ICD-10-CM

## 2014-01-02 DIAGNOSIS — R3129 Other microscopic hematuria: Secondary | ICD-10-CM | POA: Insufficient documentation

## 2014-01-02 DIAGNOSIS — Z125 Encounter for screening for malignant neoplasm of prostate: Secondary | ICD-10-CM

## 2014-01-02 NOTE — Progress Notes (Signed)
Patient seen and examined with resident today. I agree with resident assessment and plan.    Urine Dip Results:   Time collected: 1500  Glucose: Negative  Bilirubin: Negative  Ketones: Negative  Specific Gravity: 1.015  Blood (urine): Small (1+)  pH: 6.5  Protein: Negative  Urobilinogen: Normal   Nitrite: Negative  Leukocytes: Negative        Erven Colla., MD, FACS  Associate Program Director, Urology  Assistant Professor   [facility]

## 2014-01-02 NOTE — H&P (Addendum)
UROLOGY CLINIC  PROGRESS NOTE    Name: Spencer [name]  MRN: [id number]  Date of Birth: Sep 30, 1966  Date of Consultation: 01/02/2014    CHIEF COMPLAINT:   Chief Complaint   Patient presents with    Hematuria     last seen in 2012       SUBJECTIVE: [name] is a 48 y.o. male who returns for follow-up of microscopic hematuria.  Patient underwent negative workup in 12/2010.  He returns due to bilateral CVAT and "slightly worsening" microscopic hematuria.  He states his PCP was concerned about possible kidney stones and sent him for evaluation.  Patient denies gross hematuria, fevers or chills.  He is voiding well with no complaints other than mild dull achy bilateral flank pain.  He denies any physical straining or exertion.  Patient states he recently had " complete labs" and was told they were all normal.   KUB was obtained today but no stones are visible. He denies history of stone.    ROS:  See subjective for pertinent GU symptoms, all other systems negative.    PHYSICAL EXAM:  BP 118/72   Temp(Src) 36.3 C (97.3 F) (Thermal Scan)   Ht 1.778 m (5\' 10" )   Wt 97.1 kg (214 lb 1.1 oz)   BMI 30.72 kg/m2  VS: Appears vitally stable  General - alert/oriented; NAD  Lungs - CTA b/l, unlabored respiratory effort  Heart - RRR, extremity pulses intact  Abdomen - soft, NT/ND; no CVAT;  Musculoskeletal - FROM in all 4 extremities  Neurological - CN II-XII Grossly intact  GU system - deferred    URINE DIP RESULTS:  Urine Dip Results:   Time collected: 1500  Glucose: Negative  Bilirubin: Negative  Ketones: Negative  Specific Gravity: 1.015  Blood (urine): Small (1+)  pH: 6.5  Protein: Negative  Urobilinogen: Normal   Nitrite: Negative  Leukocytes: Negative          Orders Placed This Encounter    XR KUB    PSA SCREENING    POCT URINE DIPSTICK       ASSESSMENT:  48 y.o. male with microscopic hematuria s/p negative workup in 2012 with mild bilateral flank pain most likely MSK in origin.    PLAN:  Will evaluate  with Renal U/S in 6 weeks.  Patient was instructed to fax labs or bring paper copy with him at next visit.   Will obtain PSA today.     Earnest Rosier, MD 01/02/2014, 15:38

## 2014-02-13 ENCOUNTER — Other Ambulatory Visit (INDEPENDENT_AMBULATORY_CARE_PROVIDER_SITE_OTHER): Payer: Self-pay

## 2014-02-13 ENCOUNTER — Encounter (INDEPENDENT_AMBULATORY_CARE_PROVIDER_SITE_OTHER): Payer: Self-pay | Admitting: Urology

## 2014-06-25 ENCOUNTER — Other Ambulatory Visit (INDEPENDENT_AMBULATORY_CARE_PROVIDER_SITE_OTHER): Payer: Self-pay | Admitting: FAMILY PRACTICE

## 2014-07-08 ENCOUNTER — Encounter (INDEPENDENT_AMBULATORY_CARE_PROVIDER_SITE_OTHER): Payer: Self-pay | Admitting: Neurology

## 2014-07-08 ENCOUNTER — Ambulatory Visit: Attending: NEUROLOGY | Admitting: Neurology

## 2014-07-08 VITALS — BP 110/72 | HR 85 | Ht 69.92 in | Wt 218.3 lb

## 2014-07-08 DIAGNOSIS — G43909 Migraine, unspecified, not intractable, without status migrainosus: Secondary | ICD-10-CM | POA: Insufficient documentation

## 2014-07-08 NOTE — Progress Notes (Addendum)
[facility]   Department of Neurology                                       Operated by [facility]  Return Outpatient Note      Date:  07/08/2014  Patient Name: Spencer [name]  MRN: [id number]  Age:  48 y.o.  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   Patient reported having 1 bad headache every other week for 3 months. Says they last for 24 hours-3 days. Gets nausea, vomiting, photophobia, phonophobia, eyes/whole body hurting with these headaches. He also reported having mild headaches 3 times a week. They last for 8-12 hours. Says 2 pill of  tylenol/aleve helps with these headaches. Doesn't throw up with them.  Doesn't drink excess soda or coffee. Says Dr Rodman Pickle, at [facility], is his PCP. Dr Janeece Fitting is his personal doctor. 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  He reported poor sleep. Has trouble falling asleep. Says he wakes up twice at night. Snores at night. Denied feeling sleepy during daytime. Goes to bed at 9 pm, sleeps at 10-11 pm and wakes up at 5 am. He takes gabapentin, trazodone and tizanidine at night. Patient didn't know exact dosage of his medications. He is also on metoprolol for BP. He is not sure whether he is taking verapamil. He also reported having chronic back pain.  Information from my previous note:  "He said he has migraines since 2004. He was working as Transport planner at [employer]. He then went to college and was stressed out. 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  He reported having migraine once a month. His headache  lasts for 2 days if he catches it early, otherwise it lasts for >3 days. He takes motrin and benadryl at the onset of headache. If it doesn't work, then he will take Imitrex. His headache is usually frontal, pulsating, radiating to back of head and associated with nausea, photophobia and phonophobia. He had vomiting in the past. He says Imitrex helps him. Patient had CT brain and CTA done in August 2013 and they were unremarkable. Denied any triggers for migraines"    PHYSICAL EXAM:    BP 110/72 mmHg  Pulse 85  Ht 1.776 m (5' 9.92")  Wt 99 kg (218 lb 4.1 oz)  BMI 31.39 kg/m2    Appearance: No Acute Distress  Ophthalmoscopic: Disc Flat, Normal fundus  Carotid/Heart/Peripheral Vascular: No Bruits, RRR  Mental status:  Orientation: Awake, Alert, and Oriented x 3  Memory: Registation 3/3 Recall 3/3  Attention: Normal  Knowledge: Appropriate  Language: No aphasia  Speech: No dysarthria  Cranial Nerves:  2 No Visual Defect on Confrontation, Pupils round, equal, reactive to light  3,4,6 Extraocular Movements Intact, no nystagmus  5 Facial Sensation Intact  7 No facial asymmetry  8 Intact hearing  9,10 Palate symmetric, normal gag  11 Good shoulder shrug  12 Tongue Midline  Gait: Stable, no ataxia  Coordination: No ataxia with finger to nose testing  Sensory: Intact, Symmetric to pinprick and light touch  Muscle Tone: Normal              Muscle exam:  Arm Right Left Leg Right Left   Deltoid 5/5 5/5 Iliopsoas 5/5 5/5   Biceps 5/5 5/5 Quads 5/5 5/5   Triceps 5/5 5/5 Hamstrings 5/5 5/5   Wrist Extension 5/5 5/5 Ankle Dorsi Flexion 5/5 5/5   Wrist Flexion 5/5 5/5 Ankle Plantar Flexion 5/5 5/5   Interossei 5/5 5/5 Ankle Eversion 5/5 5/5   APB 5/5 5/5 Ankle Inversion 5/5 5/5       Reflexes   RJ BJ TJ KJ AJ Plantars   Right 2+ 2+ 2+ 2+ 2+ Downgoing   Left 2+ 2+ 2+ 2+ 2+ Downgoing                    Outside records: None to review    Assessment/Plan:     ICD-9-CM   1. 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 Reported worsening of headaches.  - Advised to call back clinic and let us know his home meds list. Will adjust his headache meds once we know what he is taking at home.  - Advised maintaining sleep hygiene.    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 This measures approximately 1.1 cm in diameter. In comparing with the September 21, 2010 study, no lesions are appreciated on the CT of that time. Recommendation is for repeat CT imaging through this region. This would be amenable to biopsy via a transpedicular approach. Without CT information or contrast administration, this is difficult to fully evaluate. An atypical hemangioma could appear similarly. Stress reaction is possible although this is an unlikely site for stress reaction. Neoplasm is possible, but unlikely given the degree of pain associated with such a small lesion. In a much younger age group, an osteoid osteoma could conceivably cause these symptoms. The differential at this time remains extremely broad.   - I see that he was ordered to get CT thoracic spine and then it didn't happen. He was also referred to spine center. Patient doesn't remember seeing any spine doctor. Says he had some imaging of his back done by PCP. Advised to send records here.  - We will get CT thoracic spine and refer to spine center if nothing had been done for follow up on his abnormal MRI thoracic spine.  - RTC 3 months.    Vivien RotaKuldeep V Patel, MD  07/11/2014, 00:49    Patient is somewhat poor historian. Not sure of the medication list and previous workup. Will need to obtain records.    Late entry for 07/08/2014. I saw and examined the patient.  I reviewed the resident's note.  I agree with the  findings and plan of care as documented in the resident's note.  Any exceptions/additions are edited/noted.    Malva LimesVijayalakshmi Brewer Hitchman, MD 07/12/2014, 22:24

## 2014-07-31 ENCOUNTER — Telehealth (HOSPITAL_COMMUNITY): Payer: Self-pay | Admitting: Neurology

## 2014-07-31 DIAGNOSIS — M549 Dorsalgia, unspecified: Secondary | ICD-10-CM

## 2014-07-31 DIAGNOSIS — R937 Abnormal findings on diagnostic imaging of other parts of musculoskeletal system: Secondary | ICD-10-CM

## 2014-07-31 NOTE — Telephone Encounter (Signed)
Called and talked to patient about previous thoracic MRI. I didn't receive any records so far. He says he had X-ray done but no follow up scan done afterwards. Will get f/u CT thoracic spine with contrast for further evaluation as per radiology recommendation. Reported no problems with IV contrast.

## 2014-08-27 ENCOUNTER — Other Ambulatory Visit (HOSPITAL_BASED_OUTPATIENT_CLINIC_OR_DEPARTMENT_OTHER): Payer: Self-pay

## 2014-08-28 ENCOUNTER — Ambulatory Visit
Admission: RE | Admit: 2014-08-28 | Discharge: 2014-08-28 | Disposition: A | Discharge status: Home / Self Care | Source: Ambulatory Visit | Attending: NEUROLOGY | Admitting: NEUROLOGY

## 2014-08-28 ENCOUNTER — Other Ambulatory Visit (HOSPITAL_COMMUNITY): Payer: Self-pay | Admitting: Neurology

## 2014-08-28 DIAGNOSIS — M2578 Osteophyte, vertebrae: Secondary | ICD-10-CM

## 2014-08-28 DIAGNOSIS — R937 Abnormal findings on diagnostic imaging of other parts of musculoskeletal system: Secondary | ICD-10-CM | POA: Insufficient documentation

## 2014-08-28 DIAGNOSIS — M549 Dorsalgia, unspecified: Secondary | ICD-10-CM | POA: Insufficient documentation

## 2014-08-28 DIAGNOSIS — M5134 Other intervertebral disc degeneration, thoracic region: Secondary | ICD-10-CM

## 2014-08-28 DIAGNOSIS — R29890 Loss of height: Secondary | ICD-10-CM

## 2014-08-28 LAB — PERFORM POC ISTAT CREATININE POINT OF CARE: CREATININE, POC: 1.2 mg/dL (ref 0.62–1.27)

## 2014-08-28 MED ORDER — IOPAMIDOL 300 MG IODINE/ML (61 %) INTRAVENOUS SOLUTION
100.00 mL | INTRAVENOUS | Status: AC
Start: 2014-08-28 — End: 2014-08-28
  Administered 2014-08-28: 100 mL via INTRAVENOUS

## 2014-09-04 ENCOUNTER — Telehealth (HOSPITAL_COMMUNITY): Payer: Self-pay | Admitting: Neurology

## 2014-09-04 NOTE — Telephone Encounter (Signed)
Attempted to call patient and inform about CT thoracic spine result. Didn't pick up.

## 2014-10-09 ENCOUNTER — Encounter (INDEPENDENT_AMBULATORY_CARE_PROVIDER_SITE_OTHER): Admitting: Neurology

## 2014-11-27 ENCOUNTER — Ambulatory Visit (INDEPENDENT_AMBULATORY_CARE_PROVIDER_SITE_OTHER): Payer: Self-pay | Admitting: Neurology

## 2014-11-27 NOTE — Telephone Encounter (Signed)
-----   Message from Harolyn Rutherford sent at 11/27/2014 10:02 AM EST -----  Regarding: Allena Katz  >> MATTHEW WOODS 11/27/2014 10:02 AM  Dr. Allena Katz, patientt is calling to report he had an neuro test done at the [facility] and the findings were abnormal.  He is also having pressure in his back again.  They told him he needs an MRI on the brain.  Please advise patient.    Thanks

## 2014-11-27 NOTE — Telephone Encounter (Signed)
Please advise  Thanks  Grace Blight, MA  11/27/2014, 10:43

## 2014-12-01 NOTE — Telephone Encounter (Signed)
Called mobile/home number listed in merlin. Didn't pick up. Left VM to call back.

## 2014-12-26 ENCOUNTER — Encounter (INDEPENDENT_AMBULATORY_CARE_PROVIDER_SITE_OTHER): Admitting: Urology

## 2015-03-23 ENCOUNTER — Ambulatory Visit (INDEPENDENT_AMBULATORY_CARE_PROVIDER_SITE_OTHER): Admitting: Physical Medicine & Rehabilitation

## 2015-03-24 ENCOUNTER — Encounter (INDEPENDENT_AMBULATORY_CARE_PROVIDER_SITE_OTHER): Payer: Self-pay | Admitting: Physical Medicine & Rehabilitation

## 2015-03-24 ENCOUNTER — Ambulatory Visit (INDEPENDENT_AMBULATORY_CARE_PROVIDER_SITE_OTHER): Admitting: Physical Medicine & Rehabilitation

## 2015-03-24 VITALS — BP 126/84 | HR 68 | Resp 16 | Ht 69.0 in | Wt 221.8 lb

## 2015-03-24 DIAGNOSIS — M5416 Radiculopathy, lumbar region: Secondary | ICD-10-CM

## 2015-03-25 NOTE — Progress Notes (Signed)
[facility] Department of Neurosurgery  [address]  [place], [place] [address]      PROGRESS NOTE    PATIENT NAME: Spencer [name], Spencer [name]  CHART NUMBER: [id number]  DATE OF BIRTH: 1966/01/08  DATE OF SERVICE: 03/24/2015    [facility]  [address]  [place], [place] [address]  Telephone: [phone]   Fax:  [phone]      HISTORY OF PRESENT ILLNESS:  He is a very nice gentleman, 49 years old, who has been referred here for evaluation because of what appears to be 1-1/2 years of pain across the lower back.  It seems to radiate in the right lower extremity.  It seems to be basically over the right lateral aspect of the foot and sometimes it goes up in the dorsum of the foot and the 4th and 5th toes all the way up to his lower back.  He has had low back pain of much significant to the point that it is hard for him to really get better.  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  They felt the patient may have radicular pain, and therefore, referred to [place] here.  The onset of this pain is sporadic.  It has been going on for the last 1-1/2 years.  What started this up is not exactly clear at this point.  The pain is worse by sitting too long.  Car rides are uncomfortable.  It is relieved basically by resting and gabapentin seems to have helped.  The patient reports no progressive weakness.  He may have some evidence of occasional night sweats or inflammation but nothing more than that.  The patient's MRI of the lumbosacral spine degenerative January 2013 revealed some degenerative changes.  The patient notes the pain seems to be pretty significant.  He is trying to lose weight voluntarily, about 15 pounds, but he has had no cancer history.  No trauma to the cervical, thoracic or lumbosacral spine.  No pelvic trauma.  No hip joint trauma at all.  No evidence of general  myalgias or migratory arthralgias or inflammatory changes noted anywhere; although, he may have irritable bowel syndrome.  It was not Crohn's disease.  This was ruled out.   So, there is no clear evidence of inflammatory arthropathy, as best I can tell.  The patient has had no general difficulties with edema, erythema, belly pain, loss of saddle sensation.  He has had no evidence of paralysis or weakness, swallowing difficulty.  Double vision is denied.      REVIEW OF SYSTEMS:  All those review of systems completely unremarkable.    PAST MEDICAL HISTORY:  Hyperlipidemia, question of peripheral artery disease during the process of working him up, it is not clear whether he really has it.  At this point, he notes he has some scabs on the skin on the lower extremity but it is not clear.  The patient does have a history of migraines, but anyway, he is going to be worked up for possible peripheral artery disease.  He has got increased lipids, coronary artery disease, irritable bowel syndrome.  He was noted to have  myocardial bridging and diabetes mellitus.  Again, no Crohn's disease as best we can tell from  speaking to him.      REVIEW OF SYSTEMS:  He has had  no burning urination on review of systems.  He has had no hematuria and no difficulties with general weight loss, edema, orthopnea, dyspnea, chest pain, shortness of breath.  With respect to the patient's interventions in the past, he did chiropractic interventions, has never tried physical therapy.  Chiropractic intervention may have helped a little bit.  He said long periods of time definitely increases his pain but the chiropractor in the past has been helpful.  The patient's low back mostly gets worse by lifting, bending, sitting too long, standing, weather and temperature changes.  He describes aching, numbing, cramping, shooting, crushing and dull.  Some pain across the upper trapezius region noted bilaterally.    FAMILY HISTORY:  Heart disease.    SOCIAL  HISTORY:  He is married.  He has 6 children.  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    ALLERGIES:  Bees stings, seasonal allergies and LATEX.     MEDICATIONS:  He takes include:   1.  Aspirin.   2.  Dicyclomine.   3.  Hydrochlorothiazide.   4.  Isosorbide.   5.  Nitroglycerin as needed.   6.  Psyllium.   7.  Gabapentin.   8.  Imitrex.   9.  Trazodone.    PHYSICAL EXAMINATION:  A nice gentleman, pleasant and alert, appears in no acute distress, significantly overweight, but he seems to be doing much better, he states.  Weight 222 pounds, pulse 68, 126/84 blood pressure, respirations 16.  He is right-hand dominant.  He is afebrile today.  HEENT is unremarkable.  Neck range of motion is normal.  Lumbar lordosis slightly increased.  Very tender across L5-S1.  Decreased lateral flexion and rotation throughout the lumbar spine to some degree but mostly movement occured at L4-L5.  Forward flexion tolerated freest of all.   Tenderness again in the lumbar spine.  SI joint seems stable.  Pulses are normal.  No erythema, no effusions or inflammatory changes.  Neck seemed to be unremarkable.  Belly soft.  Core strength is suboptimal.  Straight-leg test is equivocal on the right.  Cross straight-leg raising test is equivocal.  Valsalva maneuvers were equivocal as well.  Pulses, again I was able to get pretty good pulses, although there is a possibility he may have peripheral artery disease.  No nodules.  No effusion or inflammatory changes.  Speech, language, cognition and cerebellum normal.  The patient does have posttraumatic stress disorder and seems to be doing pretty well now and he is coping well.  In terms of the patient's gait, unremarkable, although he did have a limp, and he did have some tenderness over the right foot, he states when he was walking but x-ray in the ER at the [place] were unremarkable.    ASSESSMENT:  A pleasant gentleman with numbness in the right lower extremity but no clear evidence at this point of  impingement or peroneal nerve that I can tell on examination or any evidence of trauma to the ankle/heel at all.  This seems to be stable and seems to be normal limits, and he definitely has pain and tenderness across L5-S1.      PLAN:  Therefore, at this point we are still concerned this may be lumbar radiculopathy.  First and foremost, do an MRI of the lumbosacral spine to evaluate further.  The patient will be reexamined and reassessed after that and then we can look into it to see if that is negative and if that discomfort is coming from  someplace else.  The patient agrees with the recommendation, evaluation and treatment.      Mackey Birchwood, MD  Medical Director, [facility] Neurosurgery, Spine and Pain [facility], [place], [place], [place] [address]    [id number]; D: 03/24/2015 16:31:33; T: 03/25/2015 08:04:17

## 2015-03-31 NOTE — Progress Notes (Deleted)
error 

## 2015-04-02 ENCOUNTER — Ambulatory Visit (INDEPENDENT_AMBULATORY_CARE_PROVIDER_SITE_OTHER): Payer: Self-pay | Admitting: Physical Medicine & Rehabilitation

## 2015-04-02 NOTE — Telephone Encounter (Signed)
Will call to schedule patient and notify them of date/time. Ander Gaster, MA  04/02/2015, 09:47

## 2015-04-02 NOTE — Telephone Encounter (Signed)
-----   Message from Maisie Fus sent at 03/31/2015  2:51 PM EDT -----  >> Maisie Fus 03/31/2015 02:51 PM  [place] pt:  Pt is asking for a call to schedule his MRI. Please call.  Thanks.

## 2015-04-08 ENCOUNTER — Ambulatory Visit (INDEPENDENT_AMBULATORY_CARE_PROVIDER_SITE_OTHER): Payer: Self-pay | Admitting: Physical Medicine & Rehabilitation

## 2015-04-08 NOTE — Telephone Encounter (Signed)
MRI L-SPINE W/WO Scheduled 04/17/15 @ 2:45 P.M.. Patient to arrive @ 2:15 P.M.. Patient instructed to take order to main entrance at [facility]. Spoke with [facility] When scheduling test. Spoke with front desk staff When scheduling follow-up appointment. Patient may be rescheduling MRI. Patient verbalized understanding. Phillis Haggis, MA  04/08/2015, 13:44

## 2015-04-20 ENCOUNTER — Ambulatory Visit (INDEPENDENT_AMBULATORY_CARE_PROVIDER_SITE_OTHER): Admitting: Internal Medicine

## 2015-04-20 ENCOUNTER — Encounter (INDEPENDENT_AMBULATORY_CARE_PROVIDER_SITE_OTHER): Admitting: Physician Assistant

## 2015-04-20 DIAGNOSIS — Z029 Encounter for administrative examinations, unspecified: Secondary | ICD-10-CM

## 2015-04-20 NOTE — Progress Notes (Signed)
Date: 04/20/2015    Patient Name: Spencer [name]    Date of birth: 02/10/1966  Primary Care Provider: [name], DO     Patient was a NO SHOW for his appointment today. Letter sent to patient and PCP.    Salley Scarlet, MD  04/20/2015, 12:00

## 2015-07-02 ENCOUNTER — Encounter (FREE_STANDING_LABORATORY_FACILITY): Admitting: Family

## 2015-07-02 ENCOUNTER — Encounter (FREE_STANDING_LABORATORY_FACILITY)
Admit: 2015-07-02 | Discharge: 2015-07-02 | Disposition: A | Discharge status: Home / Self Care | Attending: Family | Admitting: Family

## 2015-07-02 DIAGNOSIS — M791 Myalgia, unspecified site: Secondary | ICD-10-CM

## 2015-07-02 DIAGNOSIS — M2550 Pain in unspecified joint: Secondary | ICD-10-CM

## 2015-07-03 LAB — PHOSPHORUS: PHOSPHORUS: 3.1 mg/dL (ref 2.4–4.7)

## 2015-07-03 LAB — CREATINE KINASE (CK), TOTAL, SERUM OR PLASMA: CREATINE KINASE: 8856 U/L — ABNORMAL HIGH (ref 45–225)

## 2015-07-03 LAB — MAGNESIUM: MAGNESIUM: 2.2 mg/dL (ref 1.6–2.5)

## 2015-07-05 ENCOUNTER — Encounter (FREE_STANDING_LABORATORY_FACILITY): Attending: Family | Admitting: Family

## 2015-07-05 ENCOUNTER — Encounter (FREE_STANDING_LABORATORY_FACILITY)
Admit: 2015-07-05 | Discharge: 2015-07-05 | Disposition: A | Discharge status: Home / Self Care | Attending: Family | Admitting: Family

## 2015-07-05 DIAGNOSIS — M791 Myalgia, unspecified site: Secondary | ICD-10-CM

## 2015-07-05 DIAGNOSIS — R748 Abnormal levels of other serum enzymes: Secondary | ICD-10-CM | POA: Insufficient documentation

## 2015-07-05 LAB — CREATINE KINASE (CK), TOTAL, SERUM OR PLASMA: CREATINE KINASE: 4624 U/L — ABNORMAL HIGH (ref 45–225)

## 2015-07-09 ENCOUNTER — Encounter (FREE_STANDING_LABORATORY_FACILITY): Admitting: Family

## 2015-07-09 ENCOUNTER — Encounter (FREE_STANDING_LABORATORY_FACILITY)
Admit: 2015-07-09 | Discharge: 2015-07-09 | Disposition: A | Discharge status: Home / Self Care | Attending: Family | Admitting: Family

## 2015-07-09 DIAGNOSIS — R748 Abnormal levels of other serum enzymes: Secondary | ICD-10-CM | POA: Insufficient documentation

## 2015-07-09 LAB — CREATINE KINASE (CK), TOTAL, SERUM OR PLASMA: CREATINE KINASE: 570 U/L — ABNORMAL HIGH (ref 45–225)

## 2015-07-23 ENCOUNTER — Encounter (FREE_STANDING_LABORATORY_FACILITY)
Admit: 2015-07-23 | Discharge: 2015-07-23 | Disposition: A | Discharge status: Home / Self Care | Attending: Family | Admitting: Family

## 2015-07-23 ENCOUNTER — Encounter (FREE_STANDING_LABORATORY_FACILITY): Admitting: Family

## 2015-07-23 DIAGNOSIS — R790 Abnormal level of blood mineral: Secondary | ICD-10-CM

## 2015-07-23 DIAGNOSIS — R7989 Other specified abnormal findings of blood chemistry: Secondary | ICD-10-CM | POA: Insufficient documentation

## 2015-07-23 DIAGNOSIS — R748 Abnormal levels of other serum enzymes: Secondary | ICD-10-CM

## 2015-07-23 LAB — CREATINE KINASE (CK), TOTAL, SERUM: CREATINE KINASE: 216 U/L (ref 45–225)

## 2015-07-23 LAB — CREATINE KINASE (CK), TOTAL, SERUM OR PLASMA: CREATINE KINASE: 216 U/L (ref 45–225)

## 2015-09-15 ENCOUNTER — Encounter (HOSPITAL_COMMUNITY): Payer: Self-pay

## 2015-09-15 ENCOUNTER — Observation Stay
Admission: EM | Admit: 2015-09-15 | Discharge: 2015-09-16 | Disposition: A | Discharge status: Home / Self Care | Attending: Neurology | Admitting: Neurology

## 2015-09-15 ENCOUNTER — Observation Stay (HOSPITAL_BASED_OUTPATIENT_CLINIC_OR_DEPARTMENT_OTHER): Admitting: Neurology

## 2015-09-15 ENCOUNTER — Emergency Department (EMERGENCY_DEPARTMENT_HOSPITAL)

## 2015-09-15 DIAGNOSIS — Z7982 Long term (current) use of aspirin: Secondary | ICD-10-CM | POA: Insufficient documentation

## 2015-09-15 DIAGNOSIS — G43919 Migraine, unspecified, intractable, without status migrainosus: Secondary | ICD-10-CM

## 2015-09-15 DIAGNOSIS — R51 Headache: Secondary | ICD-10-CM | POA: Diagnosis present

## 2015-09-15 DIAGNOSIS — K5090 Crohn's disease, unspecified, without complications: Secondary | ICD-10-CM | POA: Insufficient documentation

## 2015-09-15 DIAGNOSIS — G43901 Migraine, unspecified, not intractable, with status migrainosus: Principal | ICD-10-CM | POA: Insufficient documentation

## 2015-09-15 DIAGNOSIS — Z91030 Bee allergy status: Secondary | ICD-10-CM | POA: Insufficient documentation

## 2015-09-15 DIAGNOSIS — Z91040 Latex allergy status: Secondary | ICD-10-CM | POA: Insufficient documentation

## 2015-09-15 DIAGNOSIS — I2510 Atherosclerotic heart disease of native coronary artery without angina pectoris: Secondary | ICD-10-CM | POA: Insufficient documentation

## 2015-09-15 DIAGNOSIS — I440 Atrioventricular block, first degree: Secondary | ICD-10-CM

## 2015-09-15 DIAGNOSIS — R519 Headache, unspecified: Secondary | ICD-10-CM | POA: Diagnosis present

## 2015-09-15 DIAGNOSIS — Z833 Family history of diabetes mellitus: Secondary | ICD-10-CM | POA: Insufficient documentation

## 2015-09-15 DIAGNOSIS — Z8249 Family history of ischemic heart disease and other diseases of the circulatory system: Secondary | ICD-10-CM | POA: Insufficient documentation

## 2015-09-15 DIAGNOSIS — Z8611 Personal history of tuberculosis: Secondary | ICD-10-CM | POA: Insufficient documentation

## 2015-09-15 DIAGNOSIS — K589 Irritable bowel syndrome without diarrhea: Secondary | ICD-10-CM | POA: Insufficient documentation

## 2015-09-15 DIAGNOSIS — Z8673 Personal history of transient ischemic attack (TIA), and cerebral infarction without residual deficits: Secondary | ICD-10-CM | POA: Insufficient documentation

## 2015-09-15 LAB — CBC WITH DIFF
BASOPHIL #: 0.03 x10ˆ3/uL (ref 0.00–0.20)
BASOPHIL %: 1 %
EOSINOPHIL #: 0.21 x10ˆ3/uL (ref 0.00–0.50)
EOSINOPHIL %: 4 %
HCT: 42.1 % (ref 36.7–47.0)
HGB: 14.7 g/dL (ref 12.5–16.3)
LYMPHOCYTE #: 2.49 x10ˆ3/uL (ref 1.00–4.80)
LYMPHOCYTE %: 43 %
LYMPHOCYTE %: 43 %
MCH: 30.1 pg (ref 27.4–33.0)
MCHC: 34.9 g/dL (ref 32.5–35.8)
MCV: 86.3 fL (ref 78.0–100.0)
MONOCYTE #: 0.46 x10ˆ3/uL (ref 0.30–1.00)
MONOCYTE %: 8 %
MPV: 8.6 fL (ref 7.5–11.5)
NEUTROPHIL #: 2.64 x10ˆ3/uL (ref 1.50–7.70)
NEUTROPHIL %: 45 %
PLATELETS: 188 x10ˆ3/uL (ref 140–450)
RBC: 4.87 x10ˆ6/uL (ref 4.06–5.63)
RDW: 13.4 % (ref 12.0–15.0)
WBC: 5.8 x10ˆ3/uL (ref 3.5–11.0)

## 2015-09-15 LAB — COMPREHENSIVE METABOLIC PANEL, NON-FASTING
ALBUMIN: 3.9 g/dL (ref 3.5–5.0)
ALKALINE PHOSPHATASE: 60 U/L (ref <150)
ALT (SGPT): 16 U/L (ref <55)
ANION GAP: 9 mmol/L (ref 4–13)
AST (SGOT): 17 U/L (ref 8–48)
BILIRUBIN TOTAL: 1.5 mg/dL — ABNORMAL HIGH (ref 0.3–1.3)
BUN/CREA RATIO: 11 (ref 6–22)
BUN: 9 mg/dL (ref 8–25)
CALCIUM: 9.6 mg/dL (ref 8.5–10.4)
CHLORIDE: 104 mmol/L (ref 96–111)
CO2 TOTAL: 27 mmol/L (ref 22–32)
CREATININE: 0.85 mg/dL (ref 0.62–1.27)
ESTIMATED GFR: >59 mL/min/1.73mˆ2 (ref >59)
GLUCOSE: 117 mg/dL (ref 65–139)
POTASSIUM: 4.5 mmol/L (ref 3.5–5.1)
PROTEIN TOTAL: 7.4 g/dL (ref 6.4–8.3)
SODIUM: 140 mmol/L (ref 136–145)
SODIUM: 140 mmol/L (ref 136–145)

## 2015-09-15 LAB — LIPASE: LIPASE: 36 U/L (ref 10–80)

## 2015-09-15 LAB — ECG 12-LEAD
Calculated P Axis: 68 degrees
Calculated R Axis: 69 degrees
Calculated T Axis: 67 degrees
PR Interval: 224 ms
QRS Duration: 80 ms
QTC Calculation: 398 ms

## 2015-09-15 LAB — TROPONIN-I (FOR ED ONLY): TROPONIN I: <7 ng/L (ref 0–30)

## 2015-09-15 MED ORDER — SODIUM CHLORIDE 0.9 % INTRAVENOUS SOLUTION
250.0000 mg | Freq: Four times a day (QID) | INTRAVENOUS | Status: DC
Start: 2015-09-15 — End: 2015-09-15

## 2015-09-15 MED ORDER — SODIUM CHLORIDE 0.9 % IV BOLUS
1000.00 mL | INJECTION | Status: AC
Start: 2015-09-15 — End: 2015-09-15
  Administered 2015-09-15: 1000 mL via INTRAVENOUS
  Administered 2015-09-15: 0 mL via INTRAVENOUS

## 2015-09-15 MED ORDER — PROCHLORPERAZINE EDISYLATE 10 MG/2 ML (5 MG/ML) INJECTION SOLUTION
10.00 mg | INTRAMUSCULAR | Status: AC
Start: 2015-09-15 — End: 2015-09-15
  Administered 2015-09-15: 10 mg via INTRAVENOUS
  Filled 2015-09-15: qty 2

## 2015-09-15 MED ORDER — DIPHENHYDRAMINE 50 MG/ML INJECTION SOLUTION
50.0000 mg | Freq: Once | INTRAMUSCULAR | Status: DC
Start: 2015-09-15 — End: 2015-09-15

## 2015-09-15 MED ORDER — TIZANIDINE 4 MG TABLET
4.00 mg | ORAL_TABLET | ORAL | Status: AC
Start: 2015-09-15 — End: 2015-09-15
  Administered 2015-09-15: 4 mg via ORAL
  Filled 2015-09-15: qty 1

## 2015-09-15 MED ORDER — SUMATRIPTAN 6 MG/0.5 ML SUBCUTANEOUS SOLUTION
6.0000 mg | Freq: Once | SUBCUTANEOUS | Status: AC
Start: 2015-09-15 — End: 2015-09-15
  Administered 2015-09-15: 6 mg via SUBCUTANEOUS
  Filled 2015-09-15: qty 0.5

## 2015-09-15 MED ORDER — DEXAMETHASONE SODIUM PHOSPHATE 10 MG/ML INJECTION SOLUTION
10.00 mg | INTRAMUSCULAR | Status: DC
Start: 2015-09-15 — End: 2015-09-15

## 2015-09-15 MED ORDER — ACETAMINOPHEN 325 MG TABLET
650.0000 mg | ORAL_TABLET | ORAL | Status: DC | PRN
Start: 2015-09-15 — End: 2015-09-16

## 2015-09-15 MED ORDER — ONDANSETRON HCL (PF) 4 MG/2 ML INJECTION SOLUTION
4.0000 mg | Freq: Four times a day (QID) | INTRAMUSCULAR | Status: DC | PRN
Start: 2015-09-15 — End: 2015-09-16

## 2015-09-15 MED ORDER — DIPHENHYDRAMINE 50 MG/ML INJECTION SOLUTION
50.00 mg | INTRAMUSCULAR | Status: AC
Start: 2015-09-15 — End: 2015-09-15
  Administered 2015-09-15: 50 mg via INTRAVENOUS
  Filled 2015-09-15: qty 1

## 2015-09-15 MED ORDER — TIZANIDINE 4 MG TABLET
4.0000 mg | ORAL_TABLET | Freq: Once | ORAL | Status: DC
Start: 2015-09-15 — End: 2015-09-15

## 2015-09-15 MED ORDER — KETOROLAC 30 MG/ML (1 ML) INJECTION SOLUTION
30.00 mg | INTRAMUSCULAR | Status: AC
Start: 2015-09-15 — End: 2015-09-15
  Administered 2015-09-15: 30 mg via INTRAVENOUS
  Filled 2015-09-15: qty 1

## 2015-09-15 MED ORDER — DIPHENHYDRAMINE 50 MG/ML INJECTION SOLUTION
25.0000 mg | Freq: Three times a day (TID) | INTRAMUSCULAR | Status: DC
Start: 2015-09-15 — End: 2015-09-16
  Administered 2015-09-15 – 2015-09-16 (×3): 25 mg via INTRAVENOUS
  Filled 2015-09-15 (×3): qty 1

## 2015-09-15 MED ORDER — PNEUMOCOCCAL 23 POLYVALENT VACCINE 25 MCG/0.5 ML INJECTION SOLUTION
0.5000 mL | Freq: Once | INTRAMUSCULAR | Status: DC
Start: 2015-09-15 — End: 2015-09-16
  Administered 2015-09-15: 0 mL via INTRAMUSCULAR
  Filled 2015-09-15: qty 0.5

## 2015-09-15 MED ORDER — ACETAMINOPHEN 325 MG TABLET
975.0000 mg | ORAL_TABLET | ORAL | Status: AC
Start: 2015-09-15 — End: 2015-09-15
  Administered 2015-09-15: 975 mg via ORAL
  Filled 2015-09-15: qty 3

## 2015-09-15 MED ORDER — SODIUM CHLORIDE 0.9 % IV BOLUS
1000.0000 mL | INJECTION | Status: AC
Start: 2015-09-15 — End: 2015-09-15
  Administered 2015-09-15: 1000 mL via INTRAVENOUS

## 2015-09-15 MED ORDER — GI COCKTAIL (ANTACID SUSP, LIDOCAINE)
15.0000 mL | Freq: Once | Status: AC
Start: 2015-09-15 — End: 2015-09-15
  Administered 2015-09-15: 15 mL via ORAL
  Filled 2015-09-15: qty 15

## 2015-09-15 MED ORDER — VALPROATE SODIUM 500 MG/5 ML (100 MG/ML) INTRAVENOUS SOLUTION
500.0000 mg | Freq: Three times a day (TID) | INTRAVENOUS | Status: DC | PRN
Start: 2015-09-15 — End: 2015-09-16
  Filled 2015-09-15: qty 5

## 2015-09-15 MED ORDER — KETOROLAC 30 MG/ML (1 ML) INJECTION SOLUTION
30.0000 mg | Freq: Three times a day (TID) | INTRAMUSCULAR | Status: DC | PRN
Start: 2015-09-15 — End: 2015-09-16
  Filled 2015-09-15 (×2): qty 1

## 2015-09-15 MED ORDER — DOCUSATE SODIUM 100 MG CAPSULE
100.0000 mg | ORAL_CAPSULE | Freq: Two times a day (BID) | ORAL | Status: DC | PRN
Start: 2015-09-15 — End: 2015-09-16
  Filled 2015-09-15 (×2): qty 1

## 2015-09-15 MED ORDER — MAGNESIUM SULFATE 2 GRAM/50 ML (4 %) IN WATER INTRAVENOUS PIGGYBACK
2.0000 g | INJECTION | INTRAVENOUS | Status: AC
Start: 2015-09-15 — End: 2015-09-15
  Administered 2015-09-15: 2 g via INTRAVENOUS
  Filled 2015-09-15: qty 50

## 2015-09-15 MED ORDER — PROCHLORPERAZINE EDISYLATE 10 MG/2 ML (5 MG/ML) INJECTION SOLUTION
10.0000 mg | Freq: Three times a day (TID) | INTRAMUSCULAR | Status: DC
Start: 2015-09-15 — End: 2015-09-15

## 2015-09-15 MED ORDER — PROCHLORPERAZINE EDISYLATE 10 MG/2 ML (5 MG/ML) INJECTION SOLUTION
10.0000 mg | Freq: Three times a day (TID) | INTRAMUSCULAR | Status: DC
Start: 2015-09-15 — End: 2015-09-16
  Administered 2015-09-15 – 2015-09-16 (×3): 10 mg via INTRAVENOUS
  Filled 2015-09-15 (×5): qty 2

## 2015-09-15 MED ORDER — SODIUM CHLORIDE 0.9 % (FLUSH) INJECTION SYRINGE
2.0000 mL | INJECTION | Freq: Three times a day (TID) | INTRAMUSCULAR | Status: DC
Start: 2015-09-15 — End: 2015-09-16
  Administered 2015-09-15: 2 mL
  Administered 2015-09-16: 0 mL

## 2015-09-15 MED ORDER — SODIUM CHLORIDE 0.9 % (FLUSH) INJECTION SYRINGE
2.0000 mL | INJECTION | INTRAMUSCULAR | Status: DC | PRN
Start: 2015-09-15 — End: 2015-09-16

## 2015-09-15 MED ORDER — SODIUM CHLORIDE 0.9 % INTRAVENOUS SOLUTION
750.0000 mg | Freq: Once | INTRAVENOUS | Status: AC
Start: 2015-09-15 — End: 2015-09-15
  Administered 2015-09-15: 750 mg via INTRAVENOUS
  Filled 2015-09-15: qty 18.75

## 2015-09-15 MED ORDER — FAMOTIDINE (PF) 20 MG/2 ML INTRAVENOUS SOLUTION
20.00 mg | INTRAVENOUS | Status: AC
Start: 2015-09-15 — End: 2015-09-15
  Administered 2015-09-15: 20 mg via INTRAVENOUS

## 2015-09-15 MED ORDER — SODIUM CHLORIDE 0.9 % INTRAVENOUS SOLUTION
250.00 mg | INTRAVENOUS | Status: AC
Start: 2015-09-15 — End: 2015-09-15
  Filled 2015-09-15: qty 6.25

## 2015-09-15 MED ORDER — ENOXAPARIN 40 MG/0.4 ML SUBCUTANEOUS SYRINGE
40.0000 mg | INJECTION | SUBCUTANEOUS | Status: DC
Start: 2015-09-15 — End: 2015-09-16
  Administered 2015-09-15: 40 mg via SUBCUTANEOUS
  Filled 2015-09-15 (×2): qty 0.4

## 2015-09-15 MED ORDER — SODIUM CHLORIDE 0.9 % INTRAVENOUS SOLUTION
1000.0000 mg | Freq: Every day | INTRAVENOUS | Status: DC
Start: 2015-09-16 — End: 2015-09-16
  Filled 2015-09-15: qty 25

## 2015-09-15 MED ORDER — CAFFEINE 200 MG TABLET
200.00 mg | ORAL_TABLET | ORAL | Status: AC
Start: 2015-09-15 — End: 2015-09-15
  Administered 2015-09-15: 200 mg via ORAL
  Filled 2015-09-15: qty 1

## 2015-09-15 MED ORDER — VALPROATE SODIUM 500 MG/5 ML (100 MG/ML) INTRAVENOUS SOLUTION
500.0000 mg | INTRAVENOUS | Status: AC
Start: 2015-09-15 — End: 2015-09-15
  Administered 2015-09-15: 500 mg via INTRAVENOUS
  Filled 2015-09-15: qty 5

## 2015-09-15 MED ADMIN — famotidine (PF) 20 mg/2 mL intravenous solution: INTRAVENOUS | @ 08:00:00

## 2015-09-15 MED ADMIN — MULTIMODAL PERIARTICULAR INJECTION - TOT VOL 100 ML: INTRAVENOUS | @ 12:00:00 | NDC 63323028635

## 2015-09-15 NOTE — ED Attending Note (Signed)
Note begun by: Ezzie Dural, MD  09/15/2015, 07:53    I was physically present and directly supervised this patient's care.  Patient was seen and examined.  The resident/NP/PA's history and exam were reviewed.  Key elements in addition to and/or correction of that documentation are as follows:  Vitals  Blood pressure 132/74, pulse 75, temperature 36.6 C (97.9 F), resp. rate 18, height 1.778 m (5\' 10" ), weight 98.7 kg (217 lb 9.5 oz), SpO2 98 %..  This is a 49 y.o. male.   Hx chronic migraines, some lightheadness, dizziness. Pounding headache frontal and post. Tried some benadryl. Nausea, also epigastric abd pain had endoscopy last week and put on protonix, dx gastritis. Co nausea no chills.            Old records were reviewed.      Clinical Impression:     Encounter Diagnosis   Name Primary?    Migraine with status migrainosus        Critical Care:  None    Some of the documentation has been completed after the conclusion of patient care due to bedside patient care needs and the acuity of the department.

## 2015-09-15 NOTE — Nurses Notes (Signed)
Patient appears to be resting comfortably in bed at this time. No complaints of pain or discomfort. Eyes closed, respirations WDL. Call bell in reach. Will continue to monitor.

## 2015-09-15 NOTE — Nurses Notes (Signed)
Pt admitted to Obs bed 29 in stable condition from ED. Pt very drowsy but answers all questions appropriately. VSS per flowsheet. Oriented to room and call light. Will monitor

## 2015-09-15 NOTE — ED Provider Notes (Signed)
Emergency Medicine H&P    Spencer [name] 49 y.o. male ER 6/ 6A   Date of service: 09/15/2015      Information Obtained from: patient  Chief Complaint:  Headache  HPI: Spencer [name] is a 49 y.o.,  male who presents with headache he describes this as a severe migraine. It began this AM at 02:00. He took benadryl and motrin last night, but this did not provide relief. He is currently in severe, throbbing pain, located frontal to occipital. It is slightly atypical for him in that it radiates to his ears. It normally radiates to his eyes with pressure which he has at this point. He states this is his normal location of his migraines. He is photophobic and phonophobic. He is experiencing nausea without vomiting. He also has epigastric abdominal pain that he was previously admitted to [facility] where he received an endoscopy found to have gastritis and started on Protonix. He has not been drinking ETOH, no past history of pancreatitis, no excessive use of NSAIDs.     PAST MEDICAL:    Past Medical History   Diagnosis Date    Colitis     TB (tuberculosis)      exposed end 2007    Migraines     Reflux     Chest pain 12/23/2007    Borderline diabetes mellitus 12/23/2007    Spells (HCC) 06/16/2008    Stress test 05/28/2010    S/P cardiac catheterization 05/28/2010    PAD (peripheral artery disease) (HCC) 06/03/2010    Hypertriglyceridemia 06/03/2010    Anginal pain (HCC) 06/03/2010    CAD (coronary artery disease) 06/03/2010    Crohn's disease (HCC) 06/14/2010    IBS (irritable bowel syndrome) 06/14/2010    GERD (gastroesophageal reflux disease) 06/14/2010    Hyperlipidemia 12/26/2007     denies cp sob or mi hx    Post traumatic stress disorder (PTSD)      currently being evaluated    Arthritis      spine    HTN     Myocardial Bridging 11/09/2011    BPH (benign prostatic hyperplasia) 07/15/2011    CVA (cerebrovascular accident) (HCC) 12/2007    Wears glasses         Past Surgical History   Procedure Laterality Date    Pb  colonoscopy,biopsy      Hx laparotomy      Hx heart catheterization      Colonoscopy  09/24/2010     COLONOSCOPY performed by [name] at [facility] OR ENDO            Medications Prior to Admission     Prescriptions    nitroglycerin (NITROSTAT) 0.4 mg Sublingual Tablet, Sublingual    for 3 doses over 15 minutes    SUMATRIPTAN SUCC/NAPROXEN SOD (TREXIMET ORAL)    Take 1 Tab by mouth Once per day as needed     aspirin 325 mg Tab    take 325 mg by mouth Once a day.    dicyclomine (BENTYL) 10 mg Oral Capsule    Take 10 mg by mouth Once a day    Gabapentin (NEURONTIN) 400 mg Oral Capsule    take 1 Cap by mouth every night.    hydrochlorothiazide (HYDRODIURIL) 25 mg Oral Tablet    Take 25 mg by mouth Once a day    isosorbide mononitrate (IMDUR) 30 mg Oral Tablet Sustained Release 24 hr    Take 1 Tab (30 mg total) by mouth Once  a day    PSYLLIUM SEED, WITH SUGAR, (PSYLLIUM ORAL)    Take by mouth    traZODone (DESYREL) 50 mg Oral Tablet    Take 50 mg by mouth Every night as needed for Insomnia        Allergies   Allergen Reactions    Bee Sting [Hymenoptera Allergenic Extract] Anaphylaxis and Shortness of Breath    Latex Rash       Family History  Family History   Problem Relation Age of Onset    Hypertension Mother     Hypertension Brother     Diabetes Brother     Hypertension Brother          Social History  Social History   Substance Use Topics    Smoking status: Never Smoker     Smokeless tobacco: Never Used    Alcohol Use: No      Comment: rarely         Review of Systems   Constitutional: Positive for chills and malaise/fatigue. Negative for fever.   HENT: Positive for tinnitus. Negative for congestion, hearing loss and sore throat.    Eyes: Positive for photophobia and pain. Negative for discharge and redness.   Respiratory: Negative for sputum production and shortness of breath.    Cardiovascular: Negative for chest pain, palpitations and leg swelling.   Gastrointestinal: Positive for nausea and abdominal  pain. Negative for vomiting.        IBS   Genitourinary: Negative for dysuria, urgency and frequency.   Musculoskeletal: Positive for back pain and joint pain.   Skin: Negative for itching and rash.   Neurological: Positive for headaches. Negative for sensory change, speech change and focal weakness.   Psychiatric/Behavioral: Negative for substance abuse.         Examination:  Temperature: 36.6 C (97.9 F) Heart Rate: 69 BP (Non-Invasive): (!) 148/98 mmHg   Respiratory Rate: 16 SpO2-1: 98 % Pain Score (Numeric, Faces): 6   General: appears in good health and photophobic, in pain.  Eyes: Conjunctiva clear., Pupils equal and round, reactive to light and accomodation.   HENT:Head atraumatic and normocephalic  Neck: supple, symmetrical, trachea midline  Lungs: Clear to auscultation bilaterally.   Cardiovascular: regular rate and rhythm, S1, S2 normal, no murmur, click, rub or gallop  Abdomen: Soft, non-tender, Bowel sounds normal  Extremities: No cyanosis or edema  Skin: Skin warm and dry  Neurologic: CN II - XII grossly intact , Sensation intact, strength intact, Reflex's 2+ b/l, speech fluent. Gait normal.     Labs:    Lab Results for Last 24 Hours:    Results for orders placed or performed during the hospital encounter of 09/15/15 (from the past 24 hour(s))   COMPREHENSIVE METABOLIC PANEL, NON-FASTING   Result Value Ref Range    SODIUM 140 136-145 mmol/L    POTASSIUM 4.5 3.5-5.1 mmol/L    CHLORIDE 104 96-111 mmol/L    CO2 TOTAL 27 22-32 mmol/L    ANION GAP 9 4-13 mmol/L    BUN 9 8-25 mg/dL    CREATININE 4.78 2.95-6.21 mg/dL    BUN/CREA RATIO 11 3-08    ESTIMATED GFR >59 >59 mL/min/1.29m2    ALBUMIN 3.9 3.5-5.0 g/dL    CALCIUM 9.6 6.5-78.4 mg/dL    GLUCOSE 696 29-528 mg/dL    ALKALINE PHOSPHATASE 60 <150 U/L    ALT (SGPT) 16 <55 U/L    AST (SGOT) 17 8-48 U/L    BILIRUBIN TOTAL 1.5 (H) 0.3-1.3 mg/dL  PROTEIN TOTAL 7.4 6.4-8.3 g/dL   LIPASE   Result Value Ref Range    LIPASE 36 10-80 U/L   TROPONIN-I (FOR ED ONLY)      Result Value Ref Range    TROPONIN I <7 0-30 ng/L   CBC WITH DIFF   Result Value Ref Range    WBC 5.8 3.5-11.0 x103/uL    RBC 4.87 4.06-5.63 x106/uL    HGB 14.7 12.5-16.3 g/dL    HCT 16.1 09.6-04.5 %    MCV 86.3 78.0-100.0 fL    MCH 30.1 27.4-33.0 pg    MCHC 34.9 32.5-35.8 g/dL    RDW 40.9 81.1-91.4 %    PLATELETS 188 140-450 x103/uL    MPV 8.6 7.5-11.5 fL    NEUTROPHIL % 45 %    LYMPHOCYTE % 43 %    MONOCYTE % 8 %    EOSINOPHIL % 4 %    BASOPHIL % 1 %    NEUTROPHIL # 2.64 1.50-7.70 x103/uL    LYMPHOCYTE # 2.49 1.00-4.80 x103/uL    MONOCYTE # 0.46 0.30-1.00 x103/uL    EOSINOPHIL # 0.21 0.00-0.50 x103/uL    BASOPHIL # 0.03 0.00-0.20 x103/uL        Consults: Neuro  Impression: Migraine  Disposition: Admit      Gregary Signs Aricka Goldberger MD  Medicine PGY-1  Department of Medicine  [facility]   Pager [phone]

## 2015-09-15 NOTE — ED Nurses Note (Signed)
The patient informed me that he has a migraine headache, he took benadryl and motrin last night around 0200 and got no relief.  He informed me that he was diagnosed with bowel virus and is still not completely over it.  He is now being assessed by the doctor.

## 2015-09-15 NOTE — ED Nurses Note (Signed)
RN report called to voice care

## 2015-09-15 NOTE — Nurses Notes (Signed)
Pt given scheduled compazine, benadryl and solumedrol for headahce pain. Pt A/Ox4, answers appropriately. Pupiles 3 equal reactions, strenghts mod/mod. Denies any tingling or numbness. C/O photophobia.  Wife at bedside. Will monitor.

## 2015-09-15 NOTE — Nurses Notes (Signed)
Patient continues to rest comfortably at this time. No complaints. Eyes closed, respirations WDL. Call bell in reach. Will continue to monitor.

## 2015-09-15 NOTE — Care Management Notes (Signed)
OBS letter given in ED to pt's mother at bedside via verbal explanation and written handout;  Currently pt is sleeping; pt's mother accepts OBS letter and voices no concerns/questions at this time; pt's mother states she will explain/review OBS letter w/pt when he is awake.

## 2015-09-15 NOTE — H&P (Addendum)
[facility]   Neurology H&P      Spencer Spencer [name],Spencer Spencer [name], 49 y.o. male  Date of Admission:  09/15/2015  Date of Birth:  05/09/1966    PCP: Spencer Beecham, DO    Information obtained from: patient and Spencer [name]  Chief Complaint:  Headache    [id number]:[id number] Spencer Spencer [name] is Spencer [name] 49 y.o., [name] that started on Saturday morning around 10am, he did not wake up with the headache. Spencer Spencer [name] at beside. Patient reports the headache increases and decreases in intensity. Located in frontal and occipital lobes. At worst 9/10, at best 2/10. Patient reports reports headache is currently 7/10. No temporal association. Patient states he takes sumatriptan as abortive treatment, but he ran out of refills and currently does not have any at home. Reports nausea, episode of dizziness, photophobia, phonophobia. Patient denies any changes in vision, loss of vision, fever, chills, CP, SOB. Patient no currently on daily prophylactic medication for headaches. Has about 1 severe migraine every month, and typically goes to ED for this.   Last migraine occurred last month and patient went to hospital closer to home for it.   Last CT brain scan in August 2013 showed no abnormalities.     Patient denies: vomiting, abdominal pain, blurry vision, weakness, numbness, tingling, loss of bladder/bowel.      Admission Source:  Home  Advance Directives:  None-Discussed  Hospice involvement prior to admission?  Not applicable    Past Medical History   Diagnosis Date    Colitis     TB (tuberculosis)      exposed end 2007    Migraines     Reflux     Chest pain 12/23/2007    Borderline diabetes mellitus 12/23/2007    Spells (HCC) 06/16/2008    Stress test 05/28/2010    S/P cardiac catheterization 05/28/2010    PAD (peripheral artery disease) (HCC) 06/03/2010    Hypertriglyceridemia 06/03/2010    Anginal pain (HCC) 06/03/2010    CAD (coronary artery disease) 06/03/2010    Crohn's disease (HCC) 06/14/2010    IBS  (irritable bowel syndrome) 06/14/2010    GERD (gastroesophageal reflux disease) 06/14/2010    Hyperlipidemia 12/26/2007     denies cp sob or mi hx    Post traumatic stress disorder (PTSD)      currently being evaluated    Arthritis      spine    HTN     Myocardial Bridging 11/09/2011    BPH (benign prostatic hyperplasia) 07/15/2011    CVA (cerebrovascular accident) (HCC) 12/2007    Wears glasses          Past Surgical History   Procedure Laterality Date    Pb colonoscopy,biopsy      Hx laparotomy      Hx heart catheterization      Colonoscopy  09/24/2010     COLONOSCOPY performed by [name] at [facility] OR ENDO     Medications Prior to Admission     Prescriptions    nitroglycerin (NITROSTAT) 0.4 mg Sublingual Tablet, Sublingual    for 3 doses over 15 minutes    SUMATRIPTAN SUCC/NAPROXEN SOD (TREXIMET ORAL)    Take 1 Tab by mouth Once per day as needed     aspirin 325 mg Tab    take 325 mg by mouth Once Spencer [name] day.    dicyclomine (BENTYL) 10 mg Oral Capsule    Take 10 mg by mouth Once Spencer [name] day  Gabapentin (NEURONTIN) 400 mg Oral Capsule    take 1 Cap by mouth every night.    hydrochlorothiazide (HYDRODIURIL) 25 mg Oral Tablet    Take 25 mg by mouth Once Spencer [name] day    isosorbide mononitrate (IMDUR) 30 mg Oral Tablet Sustained Release 24 hr    Take 1 Tab (30 mg total) by mouth Once Spencer [name] day    PSYLLIUM SEED, WITH SUGAR, (PSYLLIUM ORAL)    Take by mouth    traZODone (DESYREL) 50 mg Oral Tablet    Take 50 mg by mouth Every night as needed for Insomnia        Allergies   Allergen Reactions    Bee Sting [Hymenoptera Allergenic Extract] Anaphylaxis and Shortness of Breath    Latex Rash     Social History   Substance Use Topics    Smoking status: Never Smoker     Smokeless tobacco: Never Used    Alcohol Use: No      Comment: rarely     Family History   Problem Relation Age of Onset    Hypertension Spencer [name]     Hypertension Brother     Diabetes Brother     Hypertension Brother      ROS: Other than ROS in the HPI, all other  systems were negative.    Exam:  Temperature: 36.6 C (97.9 F)  Heart Rate: 69  BP (Non-Invasive): (!) 148/98 mmHg  Respiratory Rate: 16  SpO2-1: 98 %  Pain Score (Numeric, Faces): 6  General: appears in good health  HENT:Head atraumatic and normocephalic  Neck: No JVD  Carotids:Carotids normal without bruit  Lungs: Clear to auscultation bilaterally.   Cardiovascular: regular rate and rhythm  Abdomen: Soft, non-tender, Bowel sounds normal  Extremities: No cyanosis or edema  Ophthalomscopic: normal w/o hemorrhages, exudates, or papilledema  Mental status:  Level of Consciousness: drowsy, received headache cocktail in ED  Orientations: Alert and oriented x 3  MemoryRegistration, Recall, and Following of commands is normal  AttentionsAttention and Concentration are normal  Knowledge: Good  Language: Normal  Speech: Normal  Cranial nerves:   CN2: Visual acuity and fields intact  CN 3,4,6: EOMI, PERRLA  CN 5Facial sensation intact  CN 7Face symmetrical  CN 8: Hearing grossly intact  CN 9,10: Palate symmetric and gag normal  CN 11: Sternocleidomastoid and Trapezius have normal strength.  CN 12: Tongue normal with no fasiculations or deviation  Gait, Coordination, and Reflexes:   Gait: Normal  Coordination: Coordination is normal without tremor    Muscle tone: WNL  Muscle exam  Arm Right Left Leg Right Left   Deltoid 5/5 5/5 Iliopsoas 5/5 5/5   Biceps 5/5 5/5 Quads 5/5 5/5   Triceps 5/5 5/5 Hamstrings 5/5 5/5   Wrist Extension 5/5 5/5 Ankle Dorsi Flexion 5/5 5/5   Wrist Flexion 5/5 5/5 Ankle Plantar Flexion 5/5 5/5   Interossei 5/5 5/5 Ankle Eversion 5/5 5/5   APB 5/5 5/5 Ankle Inversion 5/5 5/5     Reflexes   RJ BJ TJ KJ AJ Plantars Hoffman's   Right 2+ 2+ 2+ 2+ 2+ Downgoing Not present   Left 2+ 2+ 2+ 2+ 2+ Downgoing Not present     Sensory: Sensory exam in the upper and lower extremities is normal  Normal color, texture and turgor without significant lesions or rashes  Diabetes Monitors:  Patient not Spencer [name]  diabetic.    Labs:    I have reviewed all lab results.    Review of  reports and notes reveal:    Independent Interpretation of images or specimens:     Assessment/Plan:  Active Hospital Problems    Diagnosis    Headache     Mr. Fifita is Spencer [name] 49 y/o male with history of Migraines, presents to [facility] ED for Migraine of similar character as normal migraines that started on Saturday.    Headache: Manifesting as sharp pain in frontal and occipital areas. Currently 7-8/10 in intensity   - Admit to Neurology  - Neuro checks, vital checks  - Regular diet  - Does not remit with Motrin/Benadryl   - No alarming signs requiring imaging; no papilledema  - Starting headache cocktail including IVFs, Benadryl 25 mg, Compazine 10 mg, Toradol 30 mg, Depacon 500 mg, Mag Sulfate 1 g scheduledand and Solumedrol 1000mg  daily.   - Ordered CT brain w/o contrast  - Routine labs WNL  - Will cont. To monitor     DNR Status this admission:  Full Code  Palliative/Supportive Care consulted?  no  Hospice Consulted?  Not applicable    Current Comorbid Conditions - Neurology H&P  Brain Compression:  no  Obstructive Hydrocephalus:  no  Coma -Not applicable  Eye opening: 4 spontaneous, Verbal resonse:  5 oriented, Best motor response:  6 obeys commands  TIA not applicable  Cerebral Edema:  no  Encephalopathy:  no  Encephalitis-Not applicable  Seizure-Not applicable   Respiratory Failure/Other-Not applicable  Coagulopathy Not applicable      DVT/PE Prophylaxis: Lovenox    Adella Hare, MD 09/15/2015    PGY-1 Neurology  [employer]  Pager# [phone]      I saw and examined the patient.  I reviewed the resident's note.  I agree with the findings and plan of care as documented in the resident's note.  Any exceptions/additions are edited/noted.    Mellody Drown, MD

## 2015-09-15 NOTE — ED Attending Handoff Note (Signed)
Note begun by: Staci Righter, MD on 09/15/2015 at 8:06 AM  Sign-out received from Dr. Etheleen Sia  Pertinent history and physical:  Migraines, ran out of triptans so didn't take today, presents with typical migrain   BP 132/74 mmHg  Pulse 75  Temp(Src) 36.6 C (97.9 F)  Resp 18  Ht 1.778 m (5\' 10" )  Wt 98.7 kg (217 lb 9.5 oz)  BMI 31.22 kg/m2  SpO2 98%    Diagnostic Information:    No orders to display      Labs Reviewed   COMPREHENSIVE METABOLIC PANEL, NON-FASTING - Abnormal; Notable for the following:     BILIRUBIN TOTAL 1.5 (*)     All other components within normal limits    Narrative:     Slightly hemolyzed specimen. Hemolysis increases total protein results.   LIPASE - Normal   TROPONIN-I (FOR ED ONLY) - Normal   CBC/DIFF    Narrative:     The following orders were created for panel order CBC/DIFF.  Procedure                               Abnormality         Status                     ---------                               -----------         ------                     CBC WITH ADLK[[id number]]                                    Final result                 Please view results for these tests on the individual orders.   CBC WITH DIFF       To Follow-up:  Re-eval    Update:    10:58 Patient reports only minimal relief with Imitrex x 2, Toradol, APAP, Benadryl, Compazine, Mag Sulfate, and Steroids.  He wants to be admitted.  Neurology called.  13:27 Neuro to admit    Disposition: admit    Clinical Impression:     Encounter Diagnosis   Name Primary?   . Migraine with status migrainosus

## 2015-09-15 NOTE — Care Management Notes (Addendum)
Care Coordinator/Social Work [place]  Patient Name: Spencer [name]   MRN: [id number]   Estill Number: [id number]  DOB: 10/29/1966 Age: 49  **Admission Information**  Patient Type: EMERGENCY  Admit Date: 09/15/2015 Admit Time: 07:22  Admit Reason: Headache  Attending Phys: Pete Glatter   Unit: ED Bed: 07:22  160. LOC Notification, CMS Important Message / Detailed Notice  Created by : Lorayne Bender Date/Time 2015-09-15 14:24:10.000  Medical Necessity and Level Of Care Notification  Level of Care Met with pt/family/other and provided education on OBS patient status

## 2015-09-15 NOTE — ED Nurses Note (Signed)
Report received, awaiting neuro to see pt and possible admission

## 2015-09-16 LAB — CBC WITH DIFF
BASOPHIL #: 0.03 x10ˆ3/uL (ref 0.00–0.20)
BASOPHIL %: 0 %
BASOPHIL %: 0 %
EOSINOPHIL #: 0 x10ˆ3/uL (ref 0.00–0.50)
EOSINOPHIL %: 0 %
HCT: 43 % (ref 36.7–47.0)
HGB: 14.8 g/dL (ref 12.5–16.3)
LYMPHOCYTE #: 1.48 x10ˆ3/uL (ref 1.00–4.80)
LYMPHOCYTE %: 11 %
MCH: 29.5 pg (ref 27.4–33.0)
MCHC: 34.4 g/dL (ref 32.5–35.8)
MCV: 85.7 fL (ref 78.0–100.0)
MONOCYTE #: 0.06 10*3/uL — ABNORMAL LOW (ref 0.30–1.00)
MONOCYTE #: 0.06 x10ˆ3/uL — ABNORMAL LOW (ref 0.30–1.00)
MONOCYTE %: 0 %
MPV: 8.9 fL (ref 7.5–11.5)
NEUTROPHIL #: 12.59 x10ˆ3/uL — ABNORMAL HIGH (ref 1.50–7.70)
NEUTROPHIL %: 89 %
PLATELETS: 209 x10ˆ3/uL (ref 140–450)
RBC: 5.01 10*6/uL (ref 4.06–5.63)
RBC: 5.01 x10ˆ6/uL (ref 4.06–5.63)
RDW: 13 % (ref 12.0–15.0)
WBC: 14.2 x10ˆ3/uL — ABNORMAL HIGH (ref 3.5–11.0)

## 2015-09-16 LAB — BASIC METABOLIC PANEL
ANION GAP: 11 mmol/L (ref 4–13)
BUN/CREA RATIO: 16 (ref 6–22)
BUN: 14 mg/dL (ref 8–25)
CALCIUM: 9.2 mg/dL (ref 8.5–10.4)
CHLORIDE: 109 mmol/L (ref 96–111)
CO2 TOTAL: 18 mmol/L — ABNORMAL LOW (ref 22–32)
CREATININE: 0.85 mg/dL (ref 0.62–1.27)
ESTIMATED GFR: >59 mL/min/1.73mˆ2 (ref >59)
GLUCOSE: 143 mg/dL — ABNORMAL HIGH (ref 65–139)
POTASSIUM: 4.1 mmol/L (ref 3.5–5.1)
SODIUM: 138 mmol/L (ref 136–145)

## 2015-09-16 MED ORDER — SUMATRIPTAN 100 MG TABLET
100.00 mg | ORAL_TABLET | Freq: Once | ORAL | Status: DC | PRN
Start: 2015-09-16 — End: 2016-02-29

## 2015-09-16 MED ORDER — TOPIRAMATE 25 MG TABLET
ORAL_TABLET | ORAL | Status: AC
Start: 2015-09-16 — End: 2015-10-06

## 2015-09-16 MED ORDER — TOPIRAMATE 100 MG TABLET
100.00 mg | ORAL_TABLET | Freq: Every evening | ORAL | Status: DC
Start: 2015-10-07 — End: 2016-02-29

## 2015-09-16 MED ADMIN — diphenhydrAMINE 50 mg/mL injection solution: INTRAVENOUS | @ 01:00:00

## 2015-09-16 NOTE — Nurses Notes (Signed)
All medications given per eMAR, no c/o headache at this time, unlabored respirations. Patient continues to have photophobia. Will continue to monitor.

## 2015-09-16 NOTE — Nurses Notes (Signed)
Patient in room resting with eyes closed at this time. No signs of distress or discomfort, unlabored respirations. Will continue to monitor

## 2015-09-16 NOTE — Care Management Notes (Signed)
[facility] Management Initial Evaluation    Patient Name: Spencer [name]  Date of Birth: [birth year]  Sex: male  Date/Time of Admission: 09/15/2015  7:22 AM  Room/Bed: OBSU3W/29  Payor: BLUE CROSS BLUE SHIELD / Plan: FEDERAL BLUE CROSS / Product Type: PPO /   PCP: Carlyon Prows, DO    Pharmacy Info:   Preferred Pharmacy     [facility] [place], [place] - [address]    [address] [place] [address]    Phone: [phone] Fax: [phone]    Open 24 Hours?: No    [id number] [place]   [address]   Phone: [phone]: [phone]   Open 24 Hours?: No        Emergency Contact Info:   Extended Emergency Contact Information  [facility] Address: [address]          [place] [address]itter[place] ALyonsPhone: [phone] Work Phone: [phone] Mobile Phone: [phone] Relation: Wife    History:   CLOI RENNAKERis a 49y.o., male, admitted headache    Height/Weight: 177.8 cm (_0 ) / 96.3 kg (212 lb 4.9 oz)       Admitting Diagnosis: Headache [R51]    Assessment:      09/16/15 1034   Assessment Details   Assessment Type Admission   Date of Care Management Update 09/16/15   Date of Next DCP Update 09/17/15   Care Management Plan   Discharge Planning Status initial meeting   Projected Discharge Date 09/16/15   Discharge Needs Assessment   Equipment Currently Used at Home none   Equipment Needed After Discharge none   Discharge Facility/Level Of Care Needs Home (Patient/Family Member/other)(code 1)   Transportation Available car;family or friend will provide   Referral Information   Admission Type observation  (obs letter given in ED)   Address Verified verified-no changes   Arrived From other (see comments)  (ED)   Insurance Verified verified-no change   ADVANCE DIRECTIVES   Does the Patient have an Advance Directive? Yes, Patient Does Have  Advance Directive for Healthcare Treatment   Type of Advance Directive Completed Medical Power of Attorney;Medical Living Will   Copy of Advance Directives in Chart? 0   Name of MMitchell  Phone Number of MSherrillor Healthcare Surrogate [phone]  Employment/Financial   Patient has Prescription Coverage?  Yes       Name of Insurance Coverage for Medications bc/bs   Financial Concerns none   Living Environment   Select an age group to open "lives with" row.  Adult   Lives With spouse;other (see comments)  (430yrld niece)   Living Arrangements house   Able to Return to Prior Living Arrangements yes   Home Safety   Home Assessment: No Problems Identified   Home Accessibility stairs within home;no concerns   49yrd admitted with headache.  - Starting headache cocktail including IVFs, Benadryl 25 mg, Compazine 10 mg, Toradol 30 mg, Depacon 500 mg, Mag Sulfate 1 g scheduledand and Solumedrol 1000m73mily.   - Ordered CT brain w/o contrast  Met with pt at bedside. Introduced self and educated per role of care management at the hospital and in discharge planning. Reviewed demographics and no changes noted. Pt lives at home  with her wife and 94yrold niece. Good family support.     Discharge Plan:  Home (Patient/Family Member/other) (code 1)  Pt to d/c to home via family transport when medically cleared. Per Pt wife works at [employer]and will be here around noon. Will continue to follow and assist with discharge needs as identified.     The patient will continue to be evaluated for developing discharge needs.     Case Manager: CGeorgiann Mccoy SW  Phone: [phone]

## 2015-09-16 NOTE — Care Plan (Signed)
Problem: General Plan of Care(Adult,OB)  Goal: Plan of Care Review(Adult,OB)  The patient and/or their representative will communicate an understanding of their plan of care   Outcome: Adequate for Discharge Date Met:  09/16/15  Goal: Individualization/Patient Specific Goal(Adult/OB)  Outcome: Adequate for Discharge Date Met:  09/16/15  Patient to be discharged to home. IV site removed, catheter intact. Discharge instructions given and medications reviewed and understood per patient. No further questions or concerns at this time.        Problem: Pain, Acute (Adult)  Goal: Identify Related Risk Factors and Signs and Symptoms  Related risk factors and signs and symptoms are identified upon initiation of Human Response Clinical Practice Guideline (CPG)   Outcome: Adequate for Discharge Date Met:  09/16/15  Goal: Acceptable Pain Control/Comfort Level  Patient will demonstrate the desired outcomes by discharge/transition of care.   Outcome: Adequate for Discharge Date Met:  09/16/15    Problem: Fall Risk (Adult)  Goal: Identify Related Risk Factors and Signs and Symptoms  Related risk factors and signs and symptoms are identified upon initiation of Human Response Clinical Practice Guideline (CPG)   Outcome: Adequate for Discharge Date Met:  09/16/15  Goal: Absence of Falls  Patient will demonstrate the desired outcomes by discharge/transition of care.   Outcome: Adequate for Discharge Date Met:  09/16/15

## 2015-09-16 NOTE — Care Plan (Signed)
Problem: General Plan of Care(Adult,OB)  Goal: Plan of Care Review(Adult,OB)  The patient and/or their representative will communicate an understanding of their plan of care   Outcome: Ongoing (see interventions/notes)  49yrold admitted with headache.  - Starting headache cocktail including IVFs, Benadryl 25 mg, Compazine 10 mg, Toradol 30 mg, Depacon 500 mg, Mag Sulfate 1 g scheduledand and Solumedrol 10038mdaily.    - Ordered CT brain w/o contrast  Met with pt at bedside. Introduced self and educated per role of care management at the hospital and in discharge planning. Reviewed demographics and no changes noted. Pt lives at home with her wife and 4y40yrd niece. Good family support.     Discharge Plan:  Home (Patient/Family Member/other) (code 1)  Pt to d/c to home via family transport when medically cleared. Per Pt wife works at [employer] will be here around noon. Will continue to follow and assist with discharge needs as identified.

## 2015-09-16 NOTE — Discharge Summary (Addendum)
DISCHARGE SUMMARY      PATIENT NAME:  Spencer [name], Spencer [name]  MRN:  [id number]  DOB:  08-Feb-1966    ADMISSION DATE:  09/15/2015  DISCHARGE DATE:  09/16/2015    ATTENDING PHYSICIAN: Mellody Drown, MD  PRIMARY CARE PHYSICIAN: [name], DO           Reason for Admission     Diagnosis    Headache [[id number]]          DISCHARGE DIAGNOSIS:   Principle Problem: <principal problem not specified>  Active Hospital Problems    Diagnosis Date Noted    Headache 09/15/2015      Resolved Hospital Problems    Diagnosis    No resolved problems to display.     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    BPH (benign prostatic hyperplasia) 07/15/2011    Abdominal pain 07/15/2011    Nausea and vomiting 07/15/2011    Microhematuria 12/25/2010    Crohn's disease (HCC) 06/14/2010    IBS (irritable bowel syndrome) 06/14/2010    GERD (gastroesophageal reflux disease) 06/14/2010    PAD (peripheral artery disease) (HCC) 06/03/2010    Hypertriglyceridemia 06/03/2010    Anginal pain (HCC) 06/03/2010    CAD (coronary artery disease) 06/03/2010    Stress test 05/28/2010    S/P cardiac catheterization 05/28/2010    Spells (HCC) 06/16/2008    Hyperlipidemia 12/26/2007    Borderline diabetes mellitus 12/23/2007      Allergies   Allergen Reactions    Bee Sting [Hymenoptera Allergenic Extract] Anaphylaxis and Shortness of Breath    Latex Rash     DISCHARGE MEDICATIONS:     Current Discharge Medication List      START taking these medications.       Details    sumatriptan succinate 100 mg Tablet   Commonly known as:  IMITREX    100 mg, Oral, ONCE PRN, May repeat in 2 hours in needed   Qty:  10 Tab   Refills:  3       * topiramate 25 mg Tablet   Commonly known as:  TOPAMAX    25 mg QHS x 1 wk, 50 mg QHS x 1 wk, 75 mg QHS x 1 week.   Qty:  42 Tab   Refills:  0       * topiramate 100 mg Tablet   Commonly known as:   TOPAMAX   Start taking on:  10/07/2015    100 mg, Oral, EVERY EVENING   Qty:  30 Tab   Refills:  3       * Notice:  This list has 2 medication(s) that are the same as other medications prescribed for you. Read the directions carefully, and ask your doctor or other care provider to review them with you.      CONTINUE these medications - NO CHANGES were made during your visit.       Details    nitroGLYCERIN 0.4 mg Tablet, Sublingual   Commonly known as:  NITROSTAT    for 3 doses over 15 minutes   Qty:  25 Tab   Refills:  3         STOP taking these medications.          TREXIMET ORAL           Continue these medications as prescribed until followup with monitoring provider  unless otherwise stated above.  DISCHARGE INSTRUCTIONS:  Follow-up Information     Follow up with [name], DO. Schedule an appointment as soon as possible for a visit in 3 months.    Specialty:  EXTERNAL    Contact information:    [address]  [address]  [place] [address]  [phone]          Follow up with Neurology Clinic, [facility] .    Specialty:  Neurology    Contact information:    [address] [facility] [address]  [place] [address]  [phone]    Additional information:    For driving directions to the Neurology Clinic, located within the [facility] [place],  please call [phone] ([phone]) or you may visit our website at [email or web address]*Valet parking is available to patients at [facility] outpatient clinics for free and tipping is not required.*Visitors to our main campus will Radio producer as we are expanding to better serve you. We apologize for any inconvenience this may cause and appreciate your patience.        Follow up with Neurology Clinic, [facility] .    Specialty:  Neurology    Contact information:    [address] [facility] [address]  [place] [address]  [phone]    Additional information:    For driving directions to the Neurology Clinic,  located within the [facility] [place],  please call [phone] ([phone]) or you may visit our website at [email or web address]*Valet parking is available to patients at [facility] outpatient clinics for free and tipping is not required.*Visitors to our main campus will Radio producer as we are expanding to better serve you. We apologize for any inconvenience this may cause and appreciate your patience.          MISCELLANEOUS INSTRUCTIONS TO PATIENT   Follow up in Neurology Clinic within 40months  Start Topamax 25 mg twice daily for 1 week, then take 50 mg twice daily for 1 week, then 75 mg twice daily for 1 week, then 100 mg twice daily for headache prophylaxis.   Increased Sumatriptan dose to 100 mg to be used as abortive therapy at onset of Migraine. Do not take more than 3 times in 1 week.     SCHEDULE FOLLOW-UP NEUROLOGY - PHYSICIAN OFFICE CENTER   Follow-up in: 3 MONTHS    Reason for visit: HOSPITAL DISCHARGE    Followup reason: headache           REASON FOR HOSPITALIZATION AND HOSPITAL COURSE:  This is a 49 y.o., male who presented to [facility] ED for headache located in frontal and occipital area. CT of the Brain showed no ischemia or hemorrhage. Patient was given headache cocktail including IVFs, Benadryl 25 mg, Compazine 10 mg, Toradol 30 mg, Depacon 500 mg, Mag Sulfate 1 g scheduledand and Solumedrol 1000mg  daily. Routine labs WNL. Patients headache improved, vitals stable.     Follow up in Neurology Clinic within 31months  Start Topamax 25 mg twice daily for 1 week, then take 50 mg twice daily for 1 week, then 75 mg twice daily for 1 week, then 100 mg twice daily for headache prophylaxis.   Increased Sumatriptan dose to 100 mg to be used as abortive therapy at onset of Migraine.   Follow up with PCP in 3 months          CONDITION ON DISCHARGE:  A. Ambulation: Full ambulation  B. Self-care Ability: Complete  C. Cognitive  Status Alert and Oriented x 3  D. DNR status at discharge:  Full Code    Advance Directive Information       Most Recent Value    Does the Patient have an Advance Directive? Yes, Patient Does Have Advance Directive for Healthcare Treatment    Type of Advance Directive Completed Medical Power of Attorney, Medical Living Will    Copy of Advance Directives in Chart? Yes, Copy on Chart.(Specify in Comment Which Advance Directive)    Name of MPOA or Healthcare Surrogate Lakisha    Phone Number of MPOA or Healthcare Surrogate [phone]          DISCHARGE DISPOSITION:  Home discharge      @          Adella Hare, MD        Copies sent to Care Team       Relationship Specialty Notifications Start End    [name], DO PCP - General   06/23/08     Phone: [phone] Fax: [phone]         [address] [place] [address]          Referring providers can utilize [email or web address] to access their referred [facility] patient's information or call [phone] to contact this provider.

## 2015-09-16 NOTE — Nurses Notes (Signed)
Results for Spencer [name], Spencer [name] (MRN [id number]) as of 09/16/2015 05:41   Ref. Range 09/16/2015 04:47   WBC Latest Ref Range: 3.5-11.0 x103/uL 14.2 (H)   Service paged.

## 2015-09-16 NOTE — Progress Notes (Addendum)
[facility]  Neurology Progress Note      Spencer [name],Spencer [name], 49 y.o. male  Date of Admission:  09/15/2015  Date of service: 09/16/2015  Date of Birth:  07-21-1966      Chief Complaint: Headache  Pt's condition today: improving    Subjective:  Patient reports headache has improved greatly. Now about 3/10 sharp pain in frontal and occipital area. Photophobia and phonophobia has improved. Discussed with patient about daily headache prophylaxis due to increased frequency of headaches.     Patient denies: nausea, vomiting, abdominal pain, fever, chills, vision changes, blurry vision, weakness, numbness, tingling, loss of bladder/bowel.    Vital Signs:  Temp (24hrs) Max:36.9 C (98.4 F)      Systolic (24hrs), Avg:123 mmHg, Min:106 mmHg, Max:148 mmHg    Diastolic (24hrs), Avg:77 mmHg, Min:60 mmHg, Max:98 mmHg    Temp  Avg: 36.8 C (98.2 F)  Min: 36.5 C (97.7 F)  Max: 36.9 C (98.4 F)  Pulse  Avg: 75.4  Min: 65  Max: 91  Resp  Avg: 17.3  Min: 16  Max: 18  SpO2  Avg: 94.2 %  Min: 90 %  Max: 98 %  Pain Score (Numeric, Faces): 0    Today's Physical Exam:  General:alert  Mental status:Alert and oriented x 3  Memory: Registration, Recall, and Following of commands is normal  Attention: Attention and Concentration are normal  Knowledge: Good  Language and Speech: Normal and Normal  Cranial nerves: Cranial nerves 2-12 are normal  Muscle tone: WNL  Motor strength:  Motor strength is normal throughout.  Sensory: Sensory exam in the upper and lower extremities is normal  Gait: Normal  Coordination: Coordination is normal without tremor  Reflexes: Reflexes are 2/2 throughout    Current Medications:    Current Facility-Administered Medications:  acetaminophen (TYLENOL) tablet 650 mg Oral Q4H PRN   diphenhydrAMINE (BENADRYL) 50 mg/mL injection 25 mg Intravenous Q8H   docusate sodium (COLACE) capsule 100 mg Oral 2x/day PRN   enoxaparin PF (LOVENOX) 40 mg/0.4 mL SubQ injection 40 mg Subcutaneous Q24H   ketorolac (TORADOL) 30  mg/mL injection 30 mg Intravenous Q8H PRN   NS flush syringe 2 mL Intracatheter Q8HRS   And      NS flush syringe 2-6 mL Intracatheter Q1 MIN PRN   ondansetron (ZOFRAN) 2 mg/mL injection 4 mg Intravenous Q6H PRN   pneumococcal 23-valent polysaccharide vaccine (PNEUMOVAX 23) IM injection 0.5 mL Intramuscular Once   prochlorperazine (COMPAZINE) 5 mg/mL injection 10 mg Intravenous Q8H   valproate sodium (DEPACON) 500 mg in D5W 50 mL IVPB 500 mg Intravenous Q8H PRN       I/O:  I/O last 24 hours:    Intake/Output Summary (Last 24 hours) at 09/16/15 1610  Last data filed at 09/16/15 0500   Gross per 24 hour   Intake     60 ml   Output    900 ml   Net   -840 ml     I/O current shift:       Labs  Please indicate ordered or reviewed)  Reviewed: I have reviewed all lab results.    Review of reports and notes reveal:     Independent Interpretation of images or specimens:  1. CT of the Brain performed on 09/15/15 on [facility] PACS and it shows no ischemia or hemorrhage.     Patient/ Family Discussion: discussed prophylaxis of headaches    Assessment/Plan:  Active Hospital Problems    Diagnosis  Headache     [name] is [name] 49 y/o male with history of Migraines, presents to [facility] ED for Migraine of similar character as normal migraines that started on Saturday.    Headache: Manifesting as sharp pain in frontal and occipital areas. Currently 7-8/10 in intensity   - Neuro checks, vital checks  - Regular diet  - headache cocktail including IVFs, Benadryl 25 mg, Compazine 10 mg, Toradol 30 mg, Depacon 500 mg, Mag Sulfate 1 g scheduledand and Solumedrol 1000mg  daily.   - Routine labs WNL    Labs/Diagnostic studies ordered: none    DVT/PE Prophylaxis: Lovenox    Adella Hare, MD 09/16/2015    PGY-1 Neurology  [employer]  Pager# [phone]        I saw and examined the patient.  I reviewed the resident's note.  I agree with the findings and plan of care as documented in the resident's note.  Any exceptions/additions are  edited/noted.    Mellody Drown, MD

## 2015-10-21 ENCOUNTER — Ambulatory Visit (INDEPENDENT_AMBULATORY_CARE_PROVIDER_SITE_OTHER): Payer: Self-pay | Admitting: Neurology

## 2015-10-21 NOTE — Telephone Encounter (Signed)
Please advise upon return  You have no sooner openings

## 2015-10-21 NOTE — Telephone Encounter (Signed)
-----   Message from Jones Bales sent at 10/21/2015 10:07 AM EST -----  >> STEPHANIE GAMBLE 10/21/2015 10:07 AM  Dr Lana Fish                   Pt states he had another black out spell and would like to know if he could be worked in sooner please call

## 2015-12-29 ENCOUNTER — Encounter (INDEPENDENT_AMBULATORY_CARE_PROVIDER_SITE_OTHER): Admitting: Neurology

## 2016-02-29 ENCOUNTER — Ambulatory Visit (INDEPENDENT_AMBULATORY_CARE_PROVIDER_SITE_OTHER): Admitting: Internal Medicine

## 2016-02-29 VITALS — BP 130/86 | HR 81 | Resp 20 | Ht 69.0 in | Wt 221.3 lb

## 2016-02-29 DIAGNOSIS — I209 Angina pectoris, unspecified: Secondary | ICD-10-CM

## 2016-02-29 DIAGNOSIS — E785 Hyperlipidemia, unspecified: Secondary | ICD-10-CM

## 2016-02-29 DIAGNOSIS — Q245 Malformation of coronary vessels: Secondary | ICD-10-CM

## 2016-02-29 DIAGNOSIS — Z6832 Body mass index (BMI) 32.0-32.9, adult: Secondary | ICD-10-CM

## 2016-02-29 DIAGNOSIS — I2510 Atherosclerotic heart disease of native coronary artery without angina pectoris: Principal | ICD-10-CM

## 2016-02-29 MED ORDER — METOPROLOL SUCCINATE ER 25 MG TABLET,EXTENDED RELEASE 24 HR
25.0000 mg | ORAL_TABLET | Freq: Every day | ORAL | 5 refills | Status: DC
Start: 2016-02-29 — End: 2017-02-21

## 2016-02-29 NOTE — Progress Notes (Signed)
See dictated note.

## 2016-02-29 NOTE — Patient Instructions (Signed)
1. Start Toprol once a day. This will help your chest pain and possibly your migraines.  2. Get fasting cholesterol/lipid labs. Use the lab slip.   3. You are OK to try Maxalt.

## 2016-03-01 NOTE — Progress Notes (Signed)
[facility]  [address] [place], [place] [address]    PROGRESS NOTE    PATIENT NAME: Spencer [name], Spencer [name]  CHART NUMBER: [id number]  DATE OF BIRTH: Jan 09, 1966  DATE OF SERVICE: 02/29/2016    [facility]  [address]  [place], [place]  [address]    SUBJECTIVE:  Mr. Texidor is a pleasant 50 year old male who is here today to reestablish care.  I last saw him in April 2014.  He was previously on medications for angina and myocardial bridging.  He has since stopped essentially all of his cardiac medications.  The patient has a history of mild CAD, myocardial bridging, angina, Crohn's disease, hyperlipidemia, borderline diabetes mellitus, migraine headaches.    The patient has been struggling with worsening migraine headaches.  He has a prescription for Maxalt and needs to know if it is okay for him to take this.  He was on a different migraine medication in the past.  Migraines have been becoming more frequent and he is having them a few times a month.  He continues to work at the [employer].  I previously had the patient on metoprolol tartrate 25 mg b.i.d.  He was also on isosorbide in the past.  He was also on verapamil 180 mg daily.  The patient has mild exertional anginal symptoms.  These symptoms get better if he rests.  Symptoms are not increasing in frequency or severity.  Previously on Crestor for hyperlipidemia but was not taking that medication either when we last saw him in 2014.  He has had no recent labs.  All other review of systems is negative.    MEDICATIONS:  1.  Neurontin.  2.  Nitroglycerin as needed.    3.  He also has a prescription for Maxalt, but has not started that.    Today, I gave him a prescription for Toprol-XL 25 mg daily.  In the past, he was on metoprolol tartrate 25 mg b.i.d. as well as isosorbide 30 mg daily, and verapamil SR 180 mg daily.    OBJECTIVE:  BP 130/86, pulse 81, weight 221 pounds, pulse ox 96% on room air.  He is in no acute distress,  pleasant and cooperative.  Moist mucous membranes.  Neck veins are flat.  No carotid bruits.  Heart sounds are regular with normal S1, S2, no S3 or S4.  Lungs are clear, no rhonchi, rales or wheezing.  Abdomen:  Protuberant, soft, nontender.  Extremities:  No edema.    No new labs available.    ASSESSMENT:  1.  Mild CAD and myocardial bridging with increase in symptoms since he is off of his rate controlling medications include metoprolol and verapamil.  2.  Myocardial bridging.  3.  Hyperlipidemia, not currently on statin therapy.  4.  No recent labs.  5.  The patient has been lost to followup since 2014.  6.  Chronic back pain.  He states that he was evaluated at the [facility] for that.  7.  Migraine headaches increasing in frequency and severity.    PLAN:    1.  Start Toprol-XL 25 mg daily.  This may help with his migraine headaches as well as his angina related to myocardial bridging.  Interestingly, the patient states that he was seen by a couple different physicians at the [facility], which told him that they had never heard of myocardial bridging:  2.  Labs.  3.  The patient is safe to start  the Maxalt for migraines as needed.  4.  Return to clinic in 6 months.    Patient Instructions   1. Start Toprol once a day. This will help your chest pain and possibly your migraines.  2. Get fasting cholesterol/lipid labs. Use the lab slip.   3. You are OK to try Maxalt.        Orders Placed This Encounter    LIPID PANEL    ALT (SGPT)    AST (SGOT)    metoprolol succinate (TOPROL-XL) 25 mg Oral Tablet Sustained Release 24 hr        Trina Ao, MD  Assistant Professor, Section of Cardiology  [facility]    VO/PF/[id number]; D: 02/29/2016 17:29:23; T: 03/01/2016 09:38:38    cc: [name] DO      [facility] [address]      [place], [place] [address]

## 2016-05-24 ENCOUNTER — Ambulatory Visit (INDEPENDENT_AMBULATORY_CARE_PROVIDER_SITE_OTHER): Payer: Self-pay

## 2016-05-24 NOTE — Telephone Encounter (Signed)
Regarding: Symptoms  ----- Message from Magda Paganini sent at 05/24/2016 10:50 AM EDT -----  Patient has been experiencing the following symptoms on and off for the past month: - pressure on left side of neck; - "stabbing in chest"; - increased amount of migraines. Patient can be reached at [phone]. Please call and advise. Thank you!

## 2016-05-24 NOTE — Telephone Encounter (Signed)
Patient Name: Spencer [name]    Date of birth: 02/03/1966  Primary Care Provider: [name], DO   Date: 05/24/2016    [name] at requested number.  It is his work number.  There was no answer.  Message left on his voice mail to call our office.      [name] N.P.-C. with Dr. Trina Ao  [facility]

## 2016-06-02 ENCOUNTER — Encounter (INDEPENDENT_AMBULATORY_CARE_PROVIDER_SITE_OTHER): Admitting: Urology

## 2016-09-25 DIAGNOSIS — H6693 Otitis media, unspecified, bilateral: Secondary | ICD-10-CM

## 2016-09-25 DIAGNOSIS — R51 Headache: Secondary | ICD-10-CM

## 2016-09-27 ENCOUNTER — Ambulatory Visit (INDEPENDENT_AMBULATORY_CARE_PROVIDER_SITE_OTHER): Admitting: Internal Medicine

## 2016-09-27 DIAGNOSIS — Q245 Malformation of coronary vessels: Secondary | ICD-10-CM

## 2016-09-27 DIAGNOSIS — E785 Hyperlipidemia, unspecified: Secondary | ICD-10-CM

## 2016-09-27 DIAGNOSIS — I209 Angina pectoris, unspecified: Secondary | ICD-10-CM

## 2016-09-27 DIAGNOSIS — Z029 Encounter for administrative examinations, unspecified: Secondary | ICD-10-CM

## 2016-09-27 DIAGNOSIS — R51 Headache: Secondary | ICD-10-CM

## 2016-09-27 DIAGNOSIS — R7303 Prediabetes: Secondary | ICD-10-CM

## 2016-09-27 DIAGNOSIS — K5090 Crohn's disease, unspecified, without complications: Secondary | ICD-10-CM

## 2016-09-27 DIAGNOSIS — I2510 Atherosclerotic heart disease of native coronary artery without angina pectoris: Secondary | ICD-10-CM

## 2016-09-27 NOTE — Progress Notes (Signed)
Date: 09/27/2016    Patient Name: Spencer [name]    Date of birth: [birth year]  Primary Care Provider: [name], DO     Patient was a NO SHOW for his appointment today. Letter sent to patient.     Medications have been "Reviewed" as required to close this electronic record. This does not indicate that the medications were actually reviewed or verified by me.     Salley Scarlet, MD  09/27/2016, 09:12

## 2017-01-31 ENCOUNTER — Ambulatory Visit (INDEPENDENT_AMBULATORY_CARE_PROVIDER_SITE_OTHER)

## 2017-01-31 ENCOUNTER — Encounter (INDEPENDENT_AMBULATORY_CARE_PROVIDER_SITE_OTHER): Payer: Self-pay

## 2017-01-31 VITALS — BP 128/83 | HR 64 | Temp 97.6°F | Resp 18 | Ht 71.0 in | Wt 221.6 lb

## 2017-01-31 DIAGNOSIS — B359 Dermatophytosis, unspecified: Secondary | ICD-10-CM

## 2017-01-31 DIAGNOSIS — Z6830 Body mass index (BMI) 30.0-30.9, adult: Secondary | ICD-10-CM

## 2017-01-31 MED ORDER — KETOCONAZOLE 2 % TOPICAL CREAM
TOPICAL_CREAM | Freq: Two times a day (BID) | CUTANEOUS | 0 refills | Status: AC
Start: 2017-01-31 — End: ?

## 2017-01-31 NOTE — Nursing Note (Signed)
Patient has given verbal permission for the scribe to assist the healthcare provider during the patients visit.     Gladis Riffle, RN  01/31/2017, 14:38

## 2017-01-31 NOTE — Patient Instructions (Addendum)
Ringworm of the Skin    Ringworm is a fungal infection of the skin. Despite the name, a worm doesn't cause it. The cause of ringworm is a fungus that infects the outer layers of the skin. It is also not caused by bed bugs, scabies, or lice. These are totally different.  The medical term for ringworm is tinea. It can affect most parts of your body, although it seems to do better in moist areas of the body and around hair. It can be on almost any part of your body, including:   Arms, hands, legs, chest, feet, and back   Scalp   Beard   Groin   Between the toes  Depending on where it is located, sometimes the name changes:   Tinea capitis (scalp)   Tinea cruris (groin)   Tinea corporis (body)   Tinea pedis (feet)  Causes  Ringworm is very common all over the world, including the [place] It can take less than 1 week up to 2 weeks before you develop the infection after being exposed. So, you may not figure out the exact cause.  It is spread through direct contact with:   An infected person or animal   Infected soil, or objects such as towels, clothing, and combs  Symptoms  At first you might not notice ringworm. Or you may just see a small, red, often raised itchy spot or pimple. Sometimes there may only be one spot. At other times there may be several. Ringworm can look slightly different on different parts of the body, but there are some things are always present:   Irregular, round, oval or ring-shaped, which is why it's called ringworm   Clearer or lighter color at the center, since it spreads from the center of the spot outward   Red or inflamed look   Raised   Itchy   Scaly, dry, or flaky  Home care  Follow these tips to help care for yourself at home:   Leave it alone. Don't scratch at the rash or pick it. This can increase the chance of infection and scarring.   Take medicine as prescribed. If you were prescribed a cream, apply it exactly as directed. Make sure to put the cream not just on the  rash, but also on the skin 1 or 2 inches around it. Medicine by mouth issometimes needed, particularly for ringworm on the scalp.Take it as directed and until your healthcare provider says to stop.   Keep it from spreading to others. Untreated ringworm of the skin iscontagiousby skin-to-skin contact. Your child may return to school 2 days after treatment has started.  Prevention  To some degree, prevention depends on what part of your body was affected. In general, the following good hygiene can help.   Clean upafter you get dirty or sweaty, or after using a locker room.   When possible, don'tshare combs and brushes.   Avoid having your skin and feet wet or damp for long periods.   Wear clean, loose-fitting underwear.  Follow-up care  Follow up with your healthcare provider as advised by our staff if the rash does not improve after 10 days of treatment or if the rash spreads to other areas of the body.  When to seek medical advice  Call your healthcare provider right awayif any of these occur:   Redness around the rash gets worse   Fluid drains from the rash   Fever of100.6F (38C) or higher, or as directed by your healthcare  provider  Date Last Reviewed: 06/29/2015   2000-2017 [employer]. [address], [place] [address]. All rights reserved. This information is not intended as a substitute for professional medical care. Always follow your healthcare professional's instructions.      [facility]     Operated by [facility]  [place], [place] [address]  Phone: [phone])  Fax: [phone]  [email or web address]  Open Daily 8:00a - 8:00p     Closed Thanksgiving and Christmas Day     Today's orders:   Orders Placed This Encounter   . ketoconazole (NIZORAL) 2 % Cream        Prescription(s) E-Rx to:  [id number] [place], [place]   ______________________________________________________________________  Short Term Disability and [facility] does NOT provide assistance with any disability applications.  If you feel your medical condition requires you to be on disability, you will need to follow up with  Your primary care physician or a specialist.  We apologize for any inconvenience.    For Medication Prescribed by [facility]:  As an Urgent Care facility, our clinic does NOT offer prescription refills over the telephone.    If you need more of the medication one of our medical providers prescribed, you will  Either need to be re-evaluated by [place] or see your primary care physician.    ________________________________________________________________________      It is very important that we have a phone number that is the single best way to contact you in the event that we become aware of important clinical information or concerns after your discharge.  If the phone number you provided at registration is NOT this number you should inform staff and registration prior to leaving.      Your treatment and evaluation today was focused on identifying and treating potentially emergent conditions based on your presenting signs, symptoms, and history.  The resulting initial clinical impression and treatment plan is not intended to be definitive or a substitute for a full physical examination and evaluation by your primary care provider.  If your symptoms persist, worsen, or you develop any new or concerning symptoms, you need to be evaluated.      If you received x-rays during your visit, be aware that the final and formal interpretation of those films by a radiologist may occur after your discharge.  If there is a significant discrepancy identified after your discharge, we will contact you at th telephone number provided at registration.      If you received a pelvic exam, you may have cultures pending for sexually  transmitted diseases.  Positive cultures are reported to the [place] Department of Health as required by state law.  You should be contacted if you cultures are positive.  We will not contact you if they are negative.  You may contact the Health Information Management Office of [facility] to get a copy of your results.  You did NOT receive a PAP smear (the screening test for cervical).  This specific test for women is best performed by your gynecologist or primary care provider when indicated.      If you are over 40 year old, we cannot discuss your personal health information with a parent, spouse, family member, or anyone else without your express consent.  This does not include those who have legitimate access to your records and information to assist  in your care under the provisions of HIPAA [facility] Portability and Accountability Act) law, or those to whom you have previously given express written consent to do so, such a legal guardian or Power of Page.      You may have received medication that may cause you to feel drowsy and/or light headed for several hours.  You may even experience some amnesia of your stay.  You should avoid operating a motor vehicle or performing any activity requiring complete alertness or coordination until you feel fully awake (approximately 24-48 hours).  Avoid alcoholic beverages.  You may also have a dry mouth for several hours.  This is a normal side effect and will disappear as the effects of the medication wear off.      Instructions discussed with patient upon discharge by clinical staff with all questions answered.  Please call [facility] [phone]) if any further questions.  Go immediately to the emergency department if any concern or worsening symptoms.      Darliss Ridgel, MD 01/31/2017, 14:48

## 2017-01-31 NOTE — Progress Notes (Signed)
History of Present Illness: Spencer [name] is a 50 y.o. male who presents to the Urgent Care today with chief complaint of    Chief Complaint     Redness had an area on the back of his neck that was itching and is a little red dot, thought maybe he got bit by something       .     Location: back of neck  Quality: itchy lesion  Onset:  First noticed 1 day ago  Severity: mild  Timing: persistent  Context: possible bite/sting?  Modifying factors: none  Associated symptoms: no pain    I reviewed and confirmed the patient's past medical history taken by the nurse or medical assistant with the addition of the following:    Past Medical History:    Past Medical History:   Diagnosis Date   . Anginal pain (HCC) 06/03/2010   . Arthritis     spine   . Borderline diabetes mellitus 12/23/2007   . BPH (benign prostatic hyperplasia) 07/15/2011   . CAD (coronary artery disease) 06/03/2010   . Chest pain 12/23/2007   . Colitis    . Crohn's disease (HCC) 06/14/2010   . CVA (cerebrovascular accident) (HCC) 12/2007   . GERD (gastroesophageal reflux disease) 06/14/2010   . HTN    . Hyperlipidemia 12/26/2007    denies cp sob or mi hx   . Hypertriglyceridemia 06/03/2010   . IBS (irritable bowel syndrome) 06/14/2010   . Migraines    . 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 PAD (peripheral artery disease) (HCC) 06/03/2010   . Post traumatic stress disorder (PTSD)     currently being evaluated   . Reflux    . S/P cardiac catheterization 05/28/2010   . Spells 06/16/2008   . Stress test 05/28/2010   . 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 Wears glasses          Past Surgical History:    Past Surgical History:   Procedure Laterality Date   . COLONOSCOPY  09/24/2010    COLONOSCOPY performed by [name] at [facility] OR ENDO   . HX HEART CATHETERIZATION     . HX LAPAROTOMY     . PB COLONOSCOPY,BIOPSY           Allergies:  Allergies   Allergen Reactions   . Bee Sting [Hymenoptera Allergenic Extract] Anaphylaxis and Shortness of Breath   . Latex Rash   . Wheat  Swelling     Medications:    Current Outpatient Prescriptions   Medication Sig   . gabapentin (NEURONTIN) 100 mg Oral Capsule Take 100 mg by mouth Every night   . ketoconazole (NIZORAL) 2 % Cream Apply topically Twice daily   . metoprolol succinate (TOPROL-XL) 25 mg Oral Tablet Sustained Release 24 hr Take 1 Tab (25 mg total) by mouth Once a day   . nitroglycerin (NITROSTAT) 0.4 mg Sublingual Tablet, Sublingual for 3 doses over 15 minutes   . pantoprazole (PROTONIX) 20 mg Oral Tablet, Delayed Release (E.C.) Take 20 mg by mouth Every morning before breakfast   . Rizatriptan (MAXALT) 10 mg Oral Tablet Take 10 mg by mouth Once, as needed for Migraine May repeat in 2 hours if   . sumatriptan succinate (IMITREX) 100 mg Oral Tablet Take 100 mg by mouth Every 12 hours as needed for Migraine May repeat in 2 hours in needed   . traZODone (DESYREL) 100 mg Oral Tablet Take 100 mg by mouth  Every night     Social History:    Social History   Substance Use Topics   . Smoking status: Never Smoker   . Smokeless tobacco: Never Used   . Alcohol use No      Comment: rarely     Family History: No significant family history.  Family Medical History     Problem Relation (Age of Onset)    Diabetes Brother    Hypertension Mother, Brother, Brother            Review of Systems:    General: no fever and no myalgias  Skin:  rash and see HPI  Heme/Lymph:  no swollen lymph glands  All other review of systems were negative    Physical Exam:  Vital signs:   Vitals:    01/31/17 1434   BP: 128/83   Pulse: 64   Resp: 18   Temp: 36.4 C (97.6 F)   TempSrc: Oral   SpO2: 97%   Weight: 100.5 kg (221 lb 9.6 oz)   Height: 1.803 m (5\' 11" )     Body mass index is 30.91 kg/(m^2). Facility age limit for growth percentiles is 20 years.  No LMP for male patient.    General:  Well appearing and No acute distress  Head:  Normocephalic and Atraumatic  Eyes:  Normal lids/lashes, EOMI and normal conjunctiva  Pulmonary:  clear to auscultation bilaterally   Cardiovascular:  regular rate/rhythm, normal S1/S2 and no murmur/rub/gallop  Skin:  warm/dry and 1.5 cm erythematous scaly ring on inferior posterior neck with central clearing    Data Reviewed:    Not applicable    Course: Condition at discharge: Good     Differential Diagnosis: ringworm, insect bite, impetigo    Assessment:   1. Ringworm        Plan:    Orders Placed This Encounter   . ketoconazole (NIZORAL) 2 % Cream      see printed DC instructions    Go to Emergency Department immediately for further work up if any concerning symptoms.  Plan was discussed and patient verbalized understanding.  If symptoms are worsening or not improving the patient should return to the Urgent Care for further evaluation.    Darliss Ridgel, MD 01/31/2017, 14:48

## 2017-02-01 ENCOUNTER — Other Ambulatory Visit (INDEPENDENT_AMBULATORY_CARE_PROVIDER_SITE_OTHER): Payer: Self-pay | Admitting: Urology

## 2017-02-01 DIAGNOSIS — R310 Gross hematuria: Secondary | ICD-10-CM

## 2017-02-02 ENCOUNTER — Encounter (INDEPENDENT_AMBULATORY_CARE_PROVIDER_SITE_OTHER): Payer: Self-pay | Admitting: Urology

## 2017-02-02 ENCOUNTER — Ambulatory Visit (INDEPENDENT_AMBULATORY_CARE_PROVIDER_SITE_OTHER): Admitting: Rheumatology

## 2017-02-02 ENCOUNTER — Ambulatory Visit: Attending: Urology | Admitting: Urology

## 2017-02-02 ENCOUNTER — Ambulatory Visit (INDEPENDENT_AMBULATORY_CARE_PROVIDER_SITE_OTHER)

## 2017-02-02 VITALS — BP 138/76 | HR 76 | Temp 98.2°F | Ht 70.0 in | Wt 223.1 lb

## 2017-02-02 DIAGNOSIS — R310 Gross hematuria: Secondary | ICD-10-CM

## 2017-02-02 DIAGNOSIS — Z6832 Body mass index (BMI) 32.0-32.9, adult: Secondary | ICD-10-CM

## 2017-02-02 DIAGNOSIS — M549 Dorsalgia, unspecified: Secondary | ICD-10-CM

## 2017-02-02 DIAGNOSIS — N400 Enlarged prostate without lower urinary tract symptoms: Secondary | ICD-10-CM

## 2017-02-02 DIAGNOSIS — K5090 Crohn's disease, unspecified, without complications: Secondary | ICD-10-CM | POA: Insufficient documentation

## 2017-02-02 DIAGNOSIS — I739 Peripheral vascular disease, unspecified: Secondary | ICD-10-CM | POA: Insufficient documentation

## 2017-02-02 DIAGNOSIS — Z125 Encounter for screening for malignant neoplasm of prostate: Secondary | ICD-10-CM | POA: Insufficient documentation

## 2017-02-02 DIAGNOSIS — Z79899 Other long term (current) drug therapy: Secondary | ICD-10-CM | POA: Insufficient documentation

## 2017-02-02 DIAGNOSIS — Z91030 Bee allergy status: Secondary | ICD-10-CM | POA: Insufficient documentation

## 2017-02-02 DIAGNOSIS — Z91040 Latex allergy status: Secondary | ICD-10-CM | POA: Insufficient documentation

## 2017-02-02 DIAGNOSIS — Z91018 Allergy to other foods: Secondary | ICD-10-CM | POA: Insufficient documentation

## 2017-02-02 LAB — PSA SCREENING: PSA: 0.4 ng/mL (ref <=4.0)

## 2017-02-02 NOTE — H&P (Signed)
Chief Complaint   Patient presents with    Benign Prostatic Hypertrophy       HPI: 51 y.o. AA gentleman with H/O Chrohn's dz and BPH presents C/O intermittent gross hematuria x 6 months. About 3 months ago had penile urethral pain while urinating.    Sometimes he cannot urinate unless he has a bowel movement. He had a colonoscopy done last year at [facility]; he stated that there was no changes. His gastroenterologist is at the [place]. Has chronic back pain with herniated disc according to the patient. He request referral to spine surgeon at [facility].    Patient counseled and case discussed regarding gross hematuria work-up.   Patient requested PCa screening today.      PMH    Patient Active Problem List   Diagnosis    Borderline diabetes mellitus    Hyperlipidemia LDL goal <70    Spells    Stress test    S/P cardiac catheterization    PAD (peripheral artery disease) (HCC)    Hypertriglyceridemia    Anginal pain (HCC)    CAD (coronary artery disease)    Crohn's disease (HCC)    IBS (irritable bowel syndrome)    GERD (gastroesophageal reflux disease)    Microhematuria    BPH (benign prostatic hyperplasia)    Abdominal pain    Myocardial Bridging    Unconjugated hyperbilirubinemia    Headache         Past Surgical History:   Procedure Laterality Date    COLONOSCOPY  09/24/2010    COLONOSCOPY performed by [name] at [facility] OR ENDO    HX HEART CATHETERIZATION      HX LAPAROTOMY      PB COLONOSCOPY,BIOPSY             Allergies   Allergen Reactions    Bee Sting [Hymenoptera Allergenic Extract] Anaphylaxis and Shortness of Breath    Latex Rash    Wheat Swelling         Outpatient Medications Prior to Visit:  gabapentin (NEURONTIN) 100 mg Oral Capsule Take 100 mg by mouth Every night   ketoconazole (NIZORAL) 2 % Cream Apply topically Twice daily   metoprolol succinate (TOPROL-XL) 25 mg Oral Tablet Sustained Release 24 hr Take 1 Tab (25 mg total) by mouth Once a day   nitroglycerin (NITROSTAT) 0.4 mg Sublingual  Tablet, Sublingual for 3 doses over 15 minutes   pantoprazole (PROTONIX) 20 mg Oral Tablet, Delayed Release (E.C.) Take 20 mg by mouth Every morning before breakfast   Rizatriptan (MAXALT) 10 mg Oral Tablet Take 10 mg by mouth Once, as needed for Migraine May repeat in 2 hours if   sumatriptan succinate (IMITREX) 100 mg Oral Tablet Take 100 mg by mouth Every 12 hours as needed for Migraine May repeat in 2 hours in needed   traZODone (DESYREL) 100 mg Oral Tablet Take 100 mg by mouth Every night     No facility-administered medications prior to visit.     Social History     Social History    Marital status: Married     Spouse name: Alvy Beal    Number of children: 6    Years of education: 18     Occupational History    social worker [employer]     Social History Main Topics    Smoking status: Never Smoker    Smokeless tobacco: Never Used    Alcohol use No      Comment: rarely  Drug use: No    Sexual activity: Not on file     Other Topics Concern    Ability To Walk 2 Flight Of Steps Without Sob/Cp Yes     Social History Narrative       Family Medical History     Problem Relation (Age of Onset)    Diabetes Brother    Hypertension Mother, Brother, Brother          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                        General/Constitutional-Neg.      Skin/Breast-Neg.       Eyes/Ears/Nose/Mouth/Throat-Neg.      Cardiovascular-Neg.      Respiratory -Neg.                        Gastrointestinal-Neg.                        Genitourinary -See HPI             Hematologic -Neg.       Endocrine -Neg.                         Musculoskeletal -Neg.                         Neurologic-Neg.                         Psychiatric -Neg.                         Allergic/Immunologic -Neg.      OBJECTIVE:               Vital Signs-   BP             P              RR            T    BP 138/76   Pulse 76   Temp 36.8 C (98.2 F) (Thermal Scan)    Ht 1.778 m (5\' 10" )   Wt  101.2 kg (223 lb 1.7 oz)   BMI 32.01 kg/m2                          Constitutional- WDWN gentleman in NAD, no weight loss, no fevers                         Skin- No rashes, no ulcerations                         Eyes- Grossly intact, eyes clear                         Ears, nose, mouth, throat- no nasal discharge, no ear discharge, tongue normal                         Respiratory- Lungs CTA bilaterally, normal breath sounds  Cardiovascular- RRR without murmurs or gallops noted                         Gastrointestinal- Abdomen soft & NT, no guarding or rebound tenderness                             Back- No CVAT, no masses palpated                         Genitourinary- Normal phallus, testes descended without masses, no inguinal hernias noted                         Rectal- Prostate slightly enlarged without nodules, no rectal masses noted                         Musculoskeletal: Grossly intact, FROM x 4                         Neurologic- Grossly intact, alert & oriented x 4                          Psychiatric- Normal affect, normal reality contact                         Hematologic/Lymphatic- No cervical lymphadenopathy, no axillary lymphadenopathy      LABS:          U/A-      Urine Dip Results:   Time collected: 1013  Glucose: Negative  Bilirubin: Negative  Ketones: Negative  Urine Specific Gravity: 1.020  Blood (urine): Moderate (2+)  pH: 6.0  Protein: Negative  Urobilinogen: Normal   Nitrite: Negative  Leukocytes: Negative  PVR Volume: 0          ASSESSMENT:  1) Gross hematuria     2) Prostate cancer screen    3) Crohn's dz    4) H/O BPH      PLAN: 1) Schedule CT IVP and cystoscopy on 2 [place].   2) Check PSA today.  3) Re-assess after studies.        I spent greater than 50% of 20 minute visit in counseling and or discussion of gross hematuria work-up and PCa screening .    Avie Arenas, MD  02/02/2017, 11:07    Erven Colla., MD, FACS  Associate Program Director, Urology  Associate  Professor of Surgery  [facility] Department of Urology

## 2017-02-09 ENCOUNTER — Encounter (INDEPENDENT_AMBULATORY_CARE_PROVIDER_SITE_OTHER): Admitting: Urology

## 2017-02-09 ENCOUNTER — Other Ambulatory Visit (HOSPITAL_COMMUNITY): Payer: Self-pay

## 2017-02-21 ENCOUNTER — Encounter (HOSPITAL_COMMUNITY): Payer: Self-pay

## 2017-02-21 ENCOUNTER — Ambulatory Visit
Admission: RE | Admit: 2017-02-21 | Discharge: 2017-02-21 | Disposition: A | Discharge status: Home / Self Care | Source: Ambulatory Visit

## 2017-03-01 ENCOUNTER — Other Ambulatory Visit (HOSPITAL_COMMUNITY): Payer: Self-pay

## 2017-03-02 ENCOUNTER — Encounter (INDEPENDENT_AMBULATORY_CARE_PROVIDER_SITE_OTHER): Payer: Self-pay

## 2017-03-02 ENCOUNTER — Ambulatory Visit (INDEPENDENT_AMBULATORY_CARE_PROVIDER_SITE_OTHER)

## 2017-03-02 VITALS — BP 118/84 | HR 70 | Resp 20 | Ht 69.5 in | Wt 219.7 lb

## 2017-03-02 DIAGNOSIS — Z6831 Body mass index (BMI) 31.0-31.9, adult: Secondary | ICD-10-CM

## 2017-03-02 DIAGNOSIS — E785 Hyperlipidemia, unspecified: Secondary | ICD-10-CM

## 2017-03-02 DIAGNOSIS — I739 Peripheral vascular disease, unspecified: Secondary | ICD-10-CM

## 2017-03-02 DIAGNOSIS — I2510 Atherosclerotic heart disease of native coronary artery without angina pectoris: Principal | ICD-10-CM

## 2017-03-02 DIAGNOSIS — R519 Headache, unspecified: Secondary | ICD-10-CM

## 2017-03-02 DIAGNOSIS — Q245 Malformation of coronary vessels: Secondary | ICD-10-CM

## 2017-03-02 DIAGNOSIS — R51 Headache: Secondary | ICD-10-CM

## 2017-03-03 NOTE — Progress Notes (Signed)
PATIENT NAME: Spencer [name], Spencer [name]  HOSPITAL NUMBER:  [id number]  DATE OF SERVICE: 03/02/2017  DATE OF BIRTH:  Apr 17, 1966    PROGRESS NOTE    SUBJECTIVE:  Mr. Harvis Mabus is a 51 year old gentleman who is known to [place] here in the cardiology office in [place].  He had been lost to followup and reestablished care back in April of 2017.  Prior to that, his last visit was April of 2014.  He has a known history of angina with only minimal coronary disease.  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  He had been on and off his medicines and at the last visit, he was placed back on his Toprol.  However, on this visit he is again off his Toprol and he is not sure where it was discontinued.  He also has a history of Crohn's, hyperlipidemia, borderline diabetes, and migraine headaches.     On presentation to the clinic today, he continues to have occasional headaches.  He has occasional chest pain about every 3 months.  He says it will be a short duration and resolve spontaneously and not come back for months on end.  He has been active doing a cardiac work out.  He is only down 6 pounds but is feeling better.  He does complain of some claudication in his legs when he walks.  He notes some occasional sores that pop up on his legs and will eventually heal over, but they are tender when they come up and usually about 0.25 inch in circular circumference.    REVIEW OF SYSTEMS:  Positive for claudication.  Positive for bilateral lower extremity sores.  Positive for occasional chest pain.  Denies any shortness of breath, palpitations or syncope.  All other systems negative.    Current Outpatient Prescriptions   Medication Sig   . aspirin (ECOTRIN) 81 mg Oral Tablet, Delayed Release (E.C.) Take 81 mg by mouth Once a day   . gabapentin (NEURONTIN) 100 mg Oral Capsule Take 100 mg by mouth Every night   . ketoconazole (NIZORAL) 2 % Cream Apply topically Twice daily   . melatonin 10 mg Oral Capsule Take 1 Cap by mouth Every  night as needed   . nitroglycerin (NITROSTAT) 0.4 mg Sublingual Tablet, Sublingual for 3 doses over 15 minutes   . pantoprazole (PROTONIX) 20 mg Oral Tablet, Delayed Release (E.C.) Take 20 mg by mouth Every morning before breakfast   . Rizatriptan (MAXALT) 10 mg Oral Tablet Take 10 mg by mouth Once, as needed for Migraine May repeat in 2 hours if   . sumatriptan succinate (IMITREX) 100 mg Oral Tablet Take 100 mg by mouth Every 12 hours as needed for Migraine May repeat in 2 hours in needed   . traZODone (DESYREL) 100 mg Oral Tablet Take 100 mg by mouth Every night      Patient Active Problem List    Diagnosis Date Noted   . Headache 09/15/2015   . Unconjugated hyperbilirubinemia 11/11/2011   . 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 BPH (benign prostatic hyperplasia) 07/15/2011   . Abdominal pain 07/15/2011   . Microhematuria 12/25/2010   . Crohn's disease (HCC) 06/14/2010   . IBS (irritable bowel syndrome) 06/14/2010   . GERD (gastroesophageal reflux disease) 06/14/2010   . PAD (peripheral artery disease) (HCC) 06/03/2010   . Hypertriglyceridemia 06/03/2010   . Anginal pain (HCC) 06/03/2010   . CAD (coronary artery disease) 06/03/2010     Mild by cath in 2009 with  myocardial bridging     . Stress test 05/28/2010     10/09/09--@ [facility]-EF 69%.  Normal exercise stress and MPS.   12/24/07--@ [place]-EF 65%.  Limited but probably normal exam due to patient motion.      . S/P cardiac catheterization 05/28/2010     12/25/07--@  revealed mild CAD with normal LVF. LAD w/prox to mid focal area of 10% stenosis, then it became a tortuous vessel in the mid portion with myocardial bridging noted in the mid-to-distal LAD.     Marland Kitchen Spells 06/16/2008     LOC in daytime and other spells of 10 sec shaking episodes in sleep     . Hyperlipidemia LDL goal <70 12/26/2007   . Borderline diabetes mellitus 12/23/2007          OBJECTIVE:  Vital signs:  Pulse 70, blood pressure 118/84, respiratory 20, O2 saturation 96%, 5 foot 9.5 inches, 219.7 pounds.   General:  He is alert, interactive, cooperative, appears in acute distress  HEENT:  Mucous membranes are moist and intact.  Cardiovascular:  S1, S2.  Regular rate and rhythm.  No audible murmur or rub.  Respiratory:  Respirations nonlabored, essentially clear to auscultation.  Abdomen:  Soft and nontender.  Extremities:  Peripheral pulses are palpable.  No peripheral edema.  Neurological:  He is alert, oriented x3.  Skin:  Warm and dry.  He has a few sores/lesions over the left lower extremity in varied stages of healing.    TESTING:  EKG demonstrates sinus rhythm.    IMPRESSION:  1. Chest pain, atypical.  2. Myocardial bridging.  Not on Toprol.    3. Claudication.    RECOMMENDATIONS:  1. We will obtain ABIs.  2. Will continue on current medication regimen.  Remain off Toprol for now.    3. We will send an order for lipids.  4. He will return to clinic in 6 months, sooner as needed based on change in symptoms or condition.     This patient was seen independently for Dr. Myer Peer.        Jude Guess, APRN, NP-C  Section of Cardiology   [facility] [facility] of [facility]            CC:   [name], DO   Fax: [phone]       DD:  03/02/2017 13:28:14  DT:  03/03/2017 23:48:40 FP  D#:  [id number]

## 2017-03-06 ENCOUNTER — Other Ambulatory Visit (HOSPITAL_COMMUNITY): Payer: Self-pay

## 2017-03-09 NOTE — Anesthesia Preprocedure Evaluation (Addendum)
ANESTHESIA PRE-OP EVALUATION  Planned Procedure: CYSTOSCOPY (N/A )  Review of Systems         patient summary reviewed  nursing notes reviewed        Pulmonary     Cardiovascular    Hypertension, CAD and PVD        GI/Hepatic/Renal        Endo/Other          Neuro/Psych/MS   headaches     Cancer                 Physical Assessment      Patient summary reviewed and Nursing notes reviewed   Airway       Mallampati: II    TM distance: >3 FB    Neck ROM: full  Mouth Opening: good.  No Facial hair  No Beard  No endotracheal tube present  No Tracheostomy present    Dental       Dentition intact             Pulmonary    Breath sounds clear to auscultation       Cardiovascular    Rhythm: regular  Rate: Normal       Other findings            Plan  Planned anesthesia type: GA /LMA    ASA 2     Intravenous induction     Anesthetic plan and risks discussed with patient.     Anesthesia issues/risks discussed are: PONV, Post-op Agitation/Tantrum, Aspiration, Intraoperative Awareness/ Recall, Difficult Airway, Blood Loss, Stroke, Dental Injuries, Sore Throat and Cardiac Events/MI.        Patient's NPO status is appropriate for Anesthesia.           Plan discussed with CRNA.                 EKG: Within last six months   03/02/2017         CXR: Not ordered  Other Studies: CBC ordered on arrival to surgery    Consults: Cardiology 03/02/2017  SUBJECTIVE:  [name] is a 51 year old gentleman who is known to [place] here in the cardiology office in [place].  He had been lost to followup and reestablished care back in April of 2017.  Prior to that, his last visit was April of 2014.  He has a known history of angina with only minimal coronary disease.  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  He had been on and off his medicines and at the last visit, he was placed back on his Toprol.  However, on this visit he is again off his Toprol and he is not sure where it was discontinued.  He also has a history of Crohn's,  hyperlipidemia, borderline diabetes, and migraine headaches.     On presentation to the clinic today, he continues to have occasional headaches.  He has occasional chest pain about every 3 months.  He says it will be a short duration and resolve spontaneously and not come back for months on end.  He has been active doing a cardiac work out.  He is only down 6 pounds but is feeling better.  He does complain of some claudication in his legs when he walks.  He notes some occasional sores that pop up on his legs and will eventually heal over, but they are tender when they come up and usually about 0.25 inch in circular circumference.  REVIEW OF SYSTEMS:  Positive for claudication.  Positive for bilateral lower extremity sores.  Positive for occasional chest pain.  Denies any shortness of breath, palpitations or syncope.  All other systems negative.         Current Outpatient Prescriptions   Medication Sig   . aspirin (ECOTRIN) 81 mg Oral Tablet, Delayed Release (E.C.) Take 81 mg by mouth Once a day   . gabapentin (NEURONTIN) 100 mg Oral Capsule Take 100 mg by mouth Every night   . ketoconazole (NIZORAL) 2 % Cream Apply topically Twice daily   . melatonin 10 mg Oral Capsule Take 1 Cap by mouth Every night as needed   . nitroglycerin (NITROSTAT) 0.4 mg Sublingual Tablet, Sublingual for 3 doses over 15 minutes   . pantoprazole (PROTONIX) 20 mg Oral Tablet, Delayed Release (E.C.) Take 20 mg by mouth Every morning before breakfast   . Rizatriptan (MAXALT) 10 mg Oral Tablet Take 10 mg by mouth Once, as needed for Migraine May repeat in 2 hours if   . sumatriptan succinate (IMITREX) 100 mg Oral Tablet Take 100 mg by mouth Every 12 hours as needed for Migraine May repeat in 2 hours in needed   . traZODone (DESYREL) 100 mg Oral Tablet Take 100 mg by mouth Every night            Patient Active Problem List    Diagnosis Date Noted   . Headache 09/15/2015   . Unconjugated hyperbilirubinemia 11/11/2011   . 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 BPH (benign prostatic hyperplasia) 07/15/2011   . Abdominal pain 07/15/2011   . Microhematuria 12/25/2010   . Crohn's disease (HCC) 06/14/2010   . IBS (irritable bowel syndrome) 06/14/2010   . GERD (gastroesophageal reflux disease) 06/14/2010   . PAD (peripheral artery disease) (HCC) 06/03/2010   . Hypertriglyceridemia 06/03/2010   . Anginal pain (HCC) 06/03/2010   . CAD (coronary artery disease) 06/03/2010     Mild by cath in 2009 with myocardial bridging   . Stress test 05/28/2010     10/09/09--@ [facility]-EF 69%.  Normal exercise stress and MPS.   12/24/07--@ [facility]-EF 65%.  Limited but probably normal exam due to patient motion.    . S/P cardiac catheterization 05/28/2010     12/25/07--@ [facility] revealed mild CAD with normal LVF. LAD w/prox to mid focal area of 10% stenosis, then it became a tortuous vessel in the mid portion with myocardial bridging noted in the mid-to-distal LAD.   Marland Kitchen Spells 06/16/2008     LOC in daytime and other spells of 10 sec shaking episodes in sleep   . Hyperlipidemia LDL goal <70 12/26/2007   . Borderline diabetes mellitus 12/23/2007          OBJECTIVE:  Vital signs:  Pulse 70, blood pressure 118/84, respiratory 20, O2 saturation 96%, 5 foot 9.5 inches, 219.7 pounds.  General:  He is alert, interactive, cooperative, appears in acute distress  HEENT:  Mucous membranes are moist and intact.  Cardiovascular:  S1, S2.  Regular rate and rhythm.  No audible murmur or rub.  Respiratory:  Respirations nonlabored, essentially clear to auscultation.  Abdomen:  Soft and nontender.  Extremities:  Peripheral pulses are palpable.  No peripheral edema.  Neurological:  He is alert, oriented x3.  Skin:  Warm and dry.  He has a few sores/lesions over the left lower extremity in varied stages of healing.    TESTING:  EKG demonstrates sinus rhythm.    IMPRESSION:  1.  Chest pain, atypical.  2.                    Myocardial bridging.  Not on Toprol.    3.                     Claudication.    RECOMMENDATIONS:  1.                    We will obtain ABIs.  2.                    Will continue on current medication regimen.  Remain off Toprol for now.    3.                    We will send an order for lipids.  4.                    He will return to clinic in 6 months, sooner as needed based on change in symptoms or condition.     This patient was seen independently for Dr. Myer Peer.        Jude Guess, APRN, NP-C  Section of Cardiology   [facility]        I did not see the patient and only entered results. Audree Bane, APRN,FNP-BC  03/09/2017, 08:35

## 2017-03-13 MED ORDER — CIPROFLOXACIN 400 MG/200 ML IN 5 % DEXTROSE INTRAVENOUS PIGGYBACK
400.00 mg | INJECTION | Freq: Once | INTRAVENOUS | Status: AC
Start: 2017-03-14 — End: 2017-03-14
  Administered 2017-03-14: 400 mg via INTRAVENOUS
  Filled 2017-03-13: qty 200

## 2017-03-14 ENCOUNTER — Ambulatory Visit (HOSPITAL_BASED_OUTPATIENT_CLINIC_OR_DEPARTMENT_OTHER)

## 2017-03-14 ENCOUNTER — Ambulatory Visit
Admission: RE | Admit: 2017-03-14 | Discharge: 2017-03-14 | Disposition: A | Discharge status: Home / Self Care | Source: Ambulatory Visit | Attending: Urology | Admitting: Urology

## 2017-03-14 ENCOUNTER — Encounter (HOSPITAL_COMMUNITY): Payer: Self-pay

## 2017-03-14 ENCOUNTER — Ambulatory Visit (HOSPITAL_BASED_OUTPATIENT_CLINIC_OR_DEPARTMENT_OTHER): Admitting: Urology

## 2017-03-14 ENCOUNTER — Ambulatory Visit (HOSPITAL_BASED_OUTPATIENT_CLINIC_OR_DEPARTMENT_OTHER): Admitting: Certified Registered"

## 2017-03-14 ENCOUNTER — Ambulatory Visit (HOSPITAL_COMMUNITY): Admitting: Family

## 2017-03-14 ENCOUNTER — Encounter (HOSPITAL_COMMUNITY)
Admission: RE | Disposition: A | Discharge status: Home / Self Care | Payer: Self-pay | Source: Ambulatory Visit | Attending: Urology

## 2017-03-14 ENCOUNTER — Other Ambulatory Visit (HOSPITAL_COMMUNITY): Payer: Self-pay | Admitting: Urology

## 2017-03-14 DIAGNOSIS — K5090 Crohn's disease, unspecified, without complications: Secondary | ICD-10-CM | POA: Insufficient documentation

## 2017-03-14 DIAGNOSIS — I10 Essential (primary) hypertension: Secondary | ICD-10-CM | POA: Insufficient documentation

## 2017-03-14 DIAGNOSIS — Z8673 Personal history of transient ischemic attack (TIA), and cerebral infarction without residual deficits: Secondary | ICD-10-CM

## 2017-03-14 DIAGNOSIS — R310 Gross hematuria: Secondary | ICD-10-CM | POA: Insufficient documentation

## 2017-03-14 DIAGNOSIS — K589 Irritable bowel syndrome without diarrhea: Secondary | ICD-10-CM | POA: Insufficient documentation

## 2017-03-14 DIAGNOSIS — F4310 Post-traumatic stress disorder, unspecified: Secondary | ICD-10-CM | POA: Insufficient documentation

## 2017-03-14 DIAGNOSIS — R319 Hematuria, unspecified: Secondary | ICD-10-CM

## 2017-03-14 DIAGNOSIS — I2510 Atherosclerotic heart disease of native coronary artery without angina pectoris: Secondary | ICD-10-CM

## 2017-03-14 DIAGNOSIS — L219 Seborrheic dermatitis, unspecified: Secondary | ICD-10-CM

## 2017-03-14 DIAGNOSIS — N401 Enlarged prostate with lower urinary tract symptoms: Secondary | ICD-10-CM

## 2017-03-14 DIAGNOSIS — E781 Pure hyperglyceridemia: Secondary | ICD-10-CM | POA: Insufficient documentation

## 2017-03-14 DIAGNOSIS — K219 Gastro-esophageal reflux disease without esophagitis: Secondary | ICD-10-CM | POA: Insufficient documentation

## 2017-03-14 DIAGNOSIS — L589 Radiodermatitis, unspecified: Secondary | ICD-10-CM

## 2017-03-14 LAB — CBC
HCT: 40.8 % (ref 36.7–47.0)
HGB: 14.2 g/dL (ref 12.5–16.3)
MCH: 30 pg (ref 27.4–33.0)
MCHC: 34.9 g/dL (ref 32.5–35.8)
MCV: 86.1 fL (ref 78.0–100.0)
MPV: 8.3 fL (ref 7.5–11.5)
PLATELETS: 197 x10ˆ3/uL (ref 140–450)
RBC: 4.73 x10ˆ6/uL (ref 4.06–5.63)
RDW: 13.6 % (ref 12.0–15.0)
WBC: 5.8 x10ˆ3/uL (ref 3.5–11.0)

## 2017-03-14 SURGERY — CYSTOSCOPY WITH BLADDER WASHING
Anesthesia: Monitor Anesthesia Care | Wound class: Clean Contaminated Wounds-The respiratory, GI, Genital, or urinary

## 2017-03-14 MED ORDER — IOPAMIDOL 300 MG IODINE/ML (61 %) INTRAVENOUS SOLUTION
100.00 mL | INTRAVENOUS | Status: AC
Start: 2017-03-14 — End: 2017-03-14
  Administered 2017-03-14: 08:00:00 100 mL via INTRAVENOUS

## 2017-03-14 MED ORDER — PROPOFOL 10 MG/ML INTRAVENOUS EMULSION
INTRAVENOUS | Status: DC | PRN
Start: 2017-03-14 — End: 2017-03-14
  Administered 2017-03-14: 200 ug/kg/min via INTRAVENOUS
  Administered 2017-03-14: 0 ug/kg/min via INTRAVENOUS

## 2017-03-14 MED ORDER — ONDANSETRON HCL (PF) 4 MG/2 ML INJECTION SOLUTION
Freq: Once | INTRAMUSCULAR | Status: DC | PRN
Start: 2017-03-14 — End: 2017-03-14
  Administered 2017-03-14: 4 mg via INTRAVENOUS

## 2017-03-14 MED ORDER — LIDOCAINE 2 % MUCOSAL JELLY IN APPLICATOR
10.00 mL | Freq: Once | Status: DC | PRN
Start: 2017-03-14 — End: 2017-03-14

## 2017-03-14 MED ORDER — LACTATED RINGERS INTRAVENOUS SOLUTION
INTRAVENOUS | Status: DC
Start: 2017-03-14 — End: 2017-03-14

## 2017-03-14 MED ORDER — FENTANYL (PF) 50 MCG/ML INJECTION SOLUTION
Freq: Once | INTRAMUSCULAR | Status: DC | PRN
Start: 2017-03-14 — End: 2017-03-14
  Administered 2017-03-14: 50 ug via INTRAVENOUS
  Administered 2017-03-14 (×2): 25 ug via INTRAVENOUS

## 2017-03-14 MED ORDER — SODIUM CHLORIDE 0.9 % (FLUSH) INJECTION SYRINGE
2.00 mL | INJECTION | INTRAMUSCULAR | Status: DC | PRN
Start: 2017-03-14 — End: 2017-03-14

## 2017-03-14 MED ORDER — MIDAZOLAM 1 MG/ML INJECTION SOLUTION
Freq: Once | INTRAMUSCULAR | Status: DC | PRN
Start: 2017-03-14 — End: 2017-03-14
  Administered 2017-03-14: 2 mg via INTRAVENOUS

## 2017-03-14 MED ORDER — FUROSEMIDE 10 MG/ML INJECTION SOLUTION
INTRAMUSCULAR | Status: AC
Start: 2017-03-14 — End: 2017-03-14
  Filled 2017-03-14: qty 4

## 2017-03-14 MED ORDER — FUROSEMIDE 10 MG/ML INJECTION SOLUTION
10.00 mg | INTRAMUSCULAR | Status: AC
Start: 2017-03-14 — End: 2017-03-14
  Administered 2017-03-14: 10 mg via INTRAVENOUS

## 2017-03-14 MED ORDER — DEXAMETHASONE SODIUM PHOSPHATE 10 MG/ML INJECTION SOLUTION
Freq: Once | INTRAMUSCULAR | Status: DC | PRN
Start: 2017-03-14 — End: 2017-03-14
  Administered 2017-03-14: 4 mg via INTRAVENOUS

## 2017-03-14 MED ORDER — SODIUM CHLORIDE 0.9 % (FLUSH) INJECTION SYRINGE
2.00 mL | INJECTION | Freq: Three times a day (TID) | INTRAMUSCULAR | Status: DC
Start: 2017-03-14 — End: 2017-03-14

## 2017-03-14 MED ADMIN — sodium chloride 0.9 % intravenous solution: INTRAVENOUS | @ 08:00:00 | NDC 00338004904

## 2017-03-14 SURGICAL SUPPLY — 9 items
CONV USE ITEM 338593 - PACK SURG CYSTO PREP STRL DISP LTX (TRAY) ×1 IMPLANT
PAD ARMBOARD FOAM BLU_FP-ECARM (POSITIONING PRODUCTS) ×1
PAD ARMBRD BLU (POSITIONING PRODUCTS) ×1 IMPLANT
SET TUBING Y CONN STRL ENTRAL (Suction) ×2 IMPLANT
SOL IRRG 0.9% NACL 3L PLASTIC CONTAINR UROMATIC LF (SOLUTIONS) ×1 IMPLANT
SOLUTION IRRG NS 3000CC 2B7127_4/CS (SOLUTIONS) ×1
TRAY CYSTO PREP PACK_CS/10 (TRAY) ×1
WATER STRL 1000ML PLASTIC PR BTL LF (SOLUTIONS) ×1 IMPLANT
WATER STRL 1000ML PLASTIC PR B_TL LF (SOLUTIONS) ×1

## 2017-03-14 NOTE — Anesthesia Transfer of Care (Signed)
ANESTHESIA TRANSFER OF CARE   Spencer [name] is a 51 y.o. ,male, Weight: 99 kg (218 lb 4.1 oz)   had Procedure(s):  CYSTOSCOPY WITH BLADDER WASHING  performed  03/14/17   Primary Service: Avie Arenas, MD    Past Medical History:   Diagnosis Date    Anginal pain (HCC) 06/03/2010    Arthritis     spine    Borderline diabetes mellitus 12/23/2007    BPH (benign prostatic hyperplasia) 07/15/2011    CAD (coronary artery disease) 06/03/2010    Chest pain 12/23/2007    Colitis     Crohn's disease (HCC) 06/14/2010    CVA (cerebrovascular accident) (HCC) 12/2007    patient unsure    GERD (gastroesophageal reflux disease) 06/14/2010    HTN     Hyperlipidemia 12/26/2007    denies cp sob or mi hx - no meds'    Hypertriglyceridemia 06/03/2010    IBS (irritable bowel syndrome) 06/14/2010    Migraines     Myocardial Bridging 11/09/2011    PAD (peripheral artery disease) (HCC) 06/03/2010    Post traumatic stress disorder (PTSD)     currently being evaluated    Reflux     S/P cardiac catheterization 05/28/2010    Spells 06/16/2008    patient denies    Stress test 05/28/2010    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    Wears glasses       Allergy History as of 03/14/17     NO KNOWN DRUG ALLERGIES       Noted Status Severity Type Reaction    12/31/10 0835 Charna Archer, FNP-BC  Deleted       12/22/07 [facility] 12/22/07 Active             ADHESIVE       Noted Status Severity Type Reaction    02/21/17 1106 Alverda Skeans, RN 12/31/10 Active       Comments:  Red rash     03/24/15 1352 Baruch Goldmann 12/31/10 Deleted       Comments:  Red rash     12/31/10 0835 Charna Archer, FNP-BC 12/31/10 Active       Comments:  Red rash           HYMENOPTERA ALLERGENIC EXTRACT       Noted Status Severity Type Reaction    07/08/14 1558 Janus Molder D, [place] 12/19/13 Active High  Anaphylaxis, Shortness of Breath    12/19/13 1506 Colonel Bald 12/19/13 Active             LATEX       Noted Status Severity Type Reaction    02/21/17  1106 Alverda Skeans, [place] 03/24/15 Deleted Medium  Rash    Comments:  Patient denies     02/21/17 1106 Alverda Skeans, [place] 03/24/15 Active Medium  Rash    Comments:  Patient denies     03/24/15 1352 Baruch Goldmann 03/24/15 Active Medium  Rash          WHEAT       Noted Status Severity Type Reaction    01/31/17 1435 Brumage, Carlus Pavlov, [place] 01/31/17 Active Low  Swelling              I completed my transfer of care / handoff to the receiving personnel during which we discussed:  Additional Info:To RR.  Report to RN.  VSS                      Last OR Temp: Temperature: 36.3 C (97.3 F)  ABG:  POTASSIUM   Date Value Ref Range Status   09/16/2015 4.1 3.5 - 5.1 mmol/L Final   08/10/2012 4.1 3.5 - 5.1 mmol/L Final     KETONES   Date Value Ref Range Status   09/23/2010 NEGATIVE NEGATIVE mg/dL Final     CALCIUM   Date Value Ref Range Status   09/16/2015 9.2 8.5 - 10.4 mg/dL Final   43/73/5789 8.7 8.5 - 10.4 mg/dL Final     CALCIUM CARBONATE CRYSTAL   Date Value Ref Range Status   09/21/2010 Test Not Performed /HPF Final     Calculated P Axis   Date Value Ref Range Status   09/15/2015 68 degrees Final     Calculated R Axis   Date Value Ref Range Status   09/15/2015 69 degrees Final     Calculated T Axis   Date Value Ref Range Status   09/15/2015 67 degrees Final     CALICUM OXALATE CRYSTAL   Date Value Ref Range Status   09/21/2010 Test Not Performed /HPF Final     CALICUM PHOSPHATE CRYSTAL   Date Value Ref Range Status   09/21/2010 Test Not Performed /HPF Final     GLUCOSE, POINT OF CARE   Date Value Ref Range Status   07/17/2012 101 70 - 105 mg/dL Final     Comment:     Cleaned MeterRN Notifiedbefore meal     Airway:   Blood pressure (!) 149/99, pulse 69, temperature 36.3 C (97.3 F), resp. rate 16, height 1.765 m (5' 9.5"), weight 99 kg (218 lb 4.1 oz), SpO2 95 %.

## 2017-03-14 NOTE — Anesthesia Postprocedure Evaluation (Signed)
Anesthesia Post Op Evaluation    Patient: Spencer [name]  Procedure(s):  CYSTOSCOPY WITH BLADDER WASHING    Last Vitals:Temperature: 36.5 C (97.7 F) (03/14/17 1158)  Heart Rate: 54 (03/14/17 1158)  BP (Non-Invasive): 136/86 (03/14/17 1158)  Respiratory Rate: 16 (03/14/17 1158)  SpO2-1: 98 % (03/14/17 1158)  Pain Score (Numeric, Faces): 0 (03/14/17 1158)  Patient is sufficiently recovered from the effects of anesthesia to participate in the evaluation and has returned to their pre-procedure level.  Patient location during evaluation: PACU   Post-procedure handoff checklist completed    Patient participation: complete - patient participated  Level of consciousness: awake and alert and responsive to verbal stimuli  Pain management: adequate  Airway patency: patent  Anesthetic complications: no  Cardiovascular status: blood pressure returned to baseline and hemodynamically stable  Respiratory status: spontaneous ventilation, unassisted, room air and nonlabored ventilation  Hydration status: euvolemic  Patient post-procedure temperature: Pt Normothermic   PONV Status: Absent

## 2017-03-14 NOTE — Progress Notes (Signed)
[name]  Discharge Day Note    [name]  Date of service: 03/14/2017  Date of Admission:  03/14/2017  Hospital Day:  LOS: 0 days     Examination at discharge: Back to functional baseline    Vital Signs:  Temperature: 36.3 C (97.3 F) (03/14/17 1109)  Heart Rate: 69 (03/14/17 1109)  BP (Non-Invasive): (!) 149/99 (03/14/17 1109)  Respiratory Rate: 16 (03/14/17 1109)  SpO2-1: 95 % (03/14/17 1109)  Pain Score (Numeric, Faces): 0 (03/14/17 8828)  General: appears in good health    Assessment/ Plan:   Spencer [name] is a 51 y.o. male Day of Surgery Procedure(s) (LRB):  CYSTOSCOPY WITH BLADDER WASHING (N/A)     Disposition : Home discharge      RTC in 6 months with UA   Pain control: NA  Antibiotics: pre-op IV    Spencer Larsson, MD 03/14/2017, 11:24  Department of Urology - PGY 3  [place]  [phone]    ATTENDING POST-OP NOTE    Patient did well post-procedure.   VS stable, patient feeling well without complaints.  Condition: Stable  Disposition: Discharge to home today.     Spencer Arenas, MD  03/14/2017, 12:22  Spencer Colla., MD, FACS  Associate Program Director, Urology  Associate Professor of Surgery (Urology)  [facility] Department of Urology

## 2017-03-14 NOTE — Care Plan (Signed)
Problem: Anxiety (Adult)  Goal: Identify Related Risk Factors and Signs and Symptoms  Related risk factors and signs and symptoms are identified upon initiation of Human Response Clinical Practice Guideline (CPG)   Outcome: Ongoing (see interventions/notes)    Problem: Thought Process Alteration (Adult)  Goal: Identify Related Risk Factors and Signs and Symptoms  Related risk factors and signs and symptoms are identified upon initiation of Human Response Clinical Practice Guideline (CPG)   Outcome: Ongoing (see interventions/notes)    Problem: Perioperative Period (Adult)  Prevent and manage potential problems including:1. bleeding2. gastrointestinal complications3. hypothermia4. infection5. pain6. perioperative injury7. respiratory compromise8. situational response9. urinary retention10. venous thromboembolism11. wound complications   Goal: Signs and Symptoms of Listed Potential Problems Will be Absent or Manageable (Perioperative Period)  Signs and symptoms of listed potential problems will be absent or manageable by discharge/transition of care (reference Perioperative Period (Adult) CPG).   Outcome: Ongoing (see interventions/notes)

## 2017-03-14 NOTE — H&P (Signed)
[facility]  PREOPERATIVE HISTORY AND PHYSICAL EXAMINATION    Name: Spencer [name]   [id number] Date: 03/14/17    CC: History of   Gross Hematuria  Enlarged Prostate with LUTS     History of present illness:  The patient is 51 y.o. male who presents for cystoscopy under MAC. He has a history of intermittent gross hematuria x 7 months. He was seen in clinic in March and we recommended hematuria workup. He presents today after having CT IVP which was unremarkable. No new issues since he was last seen. .    Past Medical History:   Diagnosis Date   . Anginal pain ([place]) 06/03/2010   . Arthritis     spine   . Borderline diabetes mellitus 12/23/2007   . BPH (benign prostatic hyperplasia) 07/15/2011   . CAD (coronary artery disease) 06/03/2010   . Chest pain 12/23/2007   . Colitis    . Crohn's disease ([place]) 06/14/2010   . CVA (cerebrovascular accident) ([place]) 12/2007    patient unsure   . GERD (gastroesophageal reflux disease) 06/14/2010   . HTN    . Hyperlipidemia 12/26/2007    denies cp sob or mi hx - no meds'   . Hypertriglyceridemia 06/03/2010   . IBS (irritable bowel syndrome) 06/14/2010   . Migraines    . 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 PAD (peripheral artery disease) ([place]) 06/03/2010   . Post traumatic stress disorder (PTSD)     currently being evaluated   . Reflux    . S/P cardiac catheterization 05/28/2010   . Spells 06/16/2008    patient denies   . Stress test 05/28/2010   . 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 Wears glasses        Past Surgical History:   Procedure Laterality Date   . Colonoscopy  09/24/2010   . Hx heart catheterization     . Hx laparotomy     . Pb colonoscopy,biopsy           (Not in an outpatient encounter)    Allergies   Allergen Reactions   . Bee Sting [Hymenoptera Allergenic Extract] Anaphylaxis and Shortness of Breath   . Adhesive      Red rash   . Wheat Swelling       Social History        Family History   Problem Relation Age of Onset   . Hypertension Mother    . Hypertension Brother    . Diabetes  Brother    . Hypertension Brother          REVIEW OF SYSTEMS    There are no new changes to the prior reviewed review of systems.    PHYSICAL EXAMINATION:   Well-developed, well-nourished male in no apparent distress.  Head and neck are normocephalic atraumatic  Respiratory examination reveals good inspiratory effort bilaterally  Abdomen is non-distended.  Neurological exam is grossly normal  Skin is normal  Musculoskeletal exam reveals movement of all extremities    ASSESSMENT: Pre Op for Cystoscopy under MAC .     PLAN:    Consent obtained    PAU orders completed   Patient appraised of risks and benefits of the procedure.     All questions of patient and family answered.     Merian Capron, MD 03/14/2017, 10:16  Department of Urology - PGY 3  [place]  [phone]      I saw and examined the  patient.  I reviewed the resident's note.  I agree with the findings and plan of care as documented in the resident's note.  Any exceptions/additions are edited/noted.    Thurmon Fair, MD  Thurmon Fair, Brooke Bonito., MD, FACS  Associate Program Director, Urology  Associate Professor of Surgery (Urology)  [facility] Department of Urology

## 2017-03-14 NOTE — OR Surgeon (Signed)
[place] OF UROLOGY   OPERATION SUMMARY     PATIENT NAME: Spencer [name] NUMBER: [id number]   DATE OF SERVICE: 03/14/2017   DATE OF BIRTH: 1966/01/30     PREOPERATIVE DIAGNOSIS:   Gross Hematuria  BPH with LUTS    POSTOPERATIVE DIAGNOSIS: Same     NAME OF PROCEDURE: 1. Flexible cystourethroscopy  2. Bladder Barbotage    FINDINGS:   1. Bilateral UOs in normal anatomical position with efflux of clear urine.  2. Bladder free of masses, lesions, and stones.  3. Prostate appeared to have Moderate enlargement with minimal protrusion of the median lobe.  4. Anterior urethra demonstrated no visible abnormalities.    ANESTHESIA: MAC    SURGEON(S): Thurmon Fair, MD  RESIDENT(S): Barrett Shell, MD    ESTIMATED BLOOD LOSS: None.     COMPLICATIONS: None.  INDICATIONS FOR PROCEDURE: The patient is 51 y.o. male who presents for cystoscopy under MAC. He has a history of intermittent gross hematuria x 7 months. He was seen in clinic in March and we recommended hematuria workup. He presents today after having CT IVP which was unremarkable. No new issues since he was last seen.    DESCRIPTION OF PROCEDURE: Preoperative antibiotic prophylaxis with a single dose of oral antibiotics was given in the holding area.  The patient was consented preoperatively and all risks and benefits of the procedure were explained including bleeding, infection, pain, bladder and urethral injury. He was laid in the lithotomy position and his genitalia were prepped and draped in the standard sterile fashion. 10 cc of 2% Lidocaine Uro-Jet were infused into the urethra. We passed a 17-French flexible cystoscope through the male urethra and into the bladder under direct visualization. The findings are listed above.  A bladder barbotage was performed. The patient tolerated this procedure well  and was then taken back to the post-operative area in stable condition.    Dr. Thurmon Fair was present and supervised the  procedure in its entirety.  DISPOSITION: The patient will be discharged home. He will follow up in 6 months with a UA.    Merian Capron, MD 03/14/2017, 11:18  Department of Urology - PGY 3  [place]  [phone]    I was present, participated and supervised the entire procedure.  Thurmon Fair, MD  03/14/2017, 12:21  Raymond Gurney., MD, FACS  Associate Program Director, Urology  Associate Professor of Surgery (Urology)  [facility] Department of Urology

## 2017-03-14 NOTE — Discharge Instructions (Signed)
SURGICAL DISCHARGE INSTRUCTIONS     Dr. Avie Arenas, MD  performed your CYSTOSCOPY WITH BLADDER WASHING today at the [facility]    [facility]:  Monday through Friday from 6 a.m. - 7 p.m.: [phone]  Between 7 p.m. - 6 a.m., weekends and holidays:  Call [facility] at [phone] or [phone].    PLEASE SEE WRITTEN HANDOUTS AS DISCUSSED BY YOUR NURSE:      SIGNS AND SYMPTOMS OF A WOUND / INCISION INFECTION   Be sure to watch for the following:   Increase in redness or red streaks near or around the wound or incision.   Increase in pain that is intense or severe and cannot be relieved by the pain medication that your doctor has given you.   Increase in swelling that cannot be relieved by elevation of a body part, or by applying ice, if permitted.   Increase in drainage, or if yellow / green in color and smells bad. This could be on a dressing or a cast.   Increase in fever for longer than 24 hours, or an increase that is higher than 101 degrees Fahrenheit (normal body temperature is 98 degrees Fahrenheit). The incision may feel warm to the touch.    **CALL YOUR DOCTOR IF ONE OR MORE OF THESE SIGNS / SYMPTOMS SHOULD OCCUR.    ANESTHESIA INFORMATION   ANESTHESIA -- ADULT PATIENTS:  You have received intravenous sedation / general anesthesia, and you may feel drowsy and light-headed for several hours. You may even experience some forgetfulness of the procedure. DO NOT DRIVE A MOTOR VEHICLE or perform any activity requiring complete alertness or coordination until you feel fully awake in about 24-48 hours. Do not drink alcoholic beverages for at least 24 hours. Do not stay alone, you must have a responsible adult available to be with you. You may also experience a dry mouth or nausea for 24 hours. This is a normal side effect and will disappear as the effects of the medication wear off.    REMEMBER   If you experience any difficulty breathing, chest pain, bleeding that you feel  is excessive, persistent nausea or vomiting or for any other concerns:  Call your physician Dr. Venia Minks at [phone] or [phone]. You may also ask to have the Urology doctor on call paged. They are available to you 24 hours a day.    SPECIAL INSTRUCTIONS / COMMENTS   See handout    FOLLOW-UP APPOINTMENTS   Please call patient services at [phone] or [phone] to schedule a date / time of return. They are open Monday - Friday from 7:30 am - 5:00 pm.

## 2017-03-15 LAB — HISTORICAL URINARY CYTOLOGY

## 2017-03-28 ENCOUNTER — Ambulatory Visit (INDEPENDENT_AMBULATORY_CARE_PROVIDER_SITE_OTHER): Admitting: PHYSICAL MEDICINE AND REHABILITATION

## 2017-05-03 ENCOUNTER — Ambulatory Visit: Attending: PHYSICAL MEDICINE AND REHABILITATION | Admitting: PHYSICAL MEDICINE AND REHABILITATION

## 2017-05-03 ENCOUNTER — Ambulatory Visit (HOSPITAL_BASED_OUTPATIENT_CLINIC_OR_DEPARTMENT_OTHER)

## 2017-05-03 ENCOUNTER — Encounter (INDEPENDENT_AMBULATORY_CARE_PROVIDER_SITE_OTHER): Payer: Self-pay | Admitting: PHYSICAL MEDICINE AND REHABILITATION

## 2017-05-03 VITALS — BP 135/91 | HR 67 | Ht 71.0 in | Wt 220.5 lb

## 2017-05-03 DIAGNOSIS — K589 Irritable bowel syndrome without diarrhea: Secondary | ICD-10-CM | POA: Insufficient documentation

## 2017-05-03 DIAGNOSIS — S139XXA Sprain of joints and ligaments of unspecified parts of neck, initial encounter: Secondary | ICD-10-CM

## 2017-05-03 DIAGNOSIS — M5136 Other intervertebral disc degeneration, lumbar region: Secondary | ICD-10-CM | POA: Insufficient documentation

## 2017-05-03 DIAGNOSIS — S335XXA Sprain of ligaments of lumbar spine, initial encounter: Secondary | ICD-10-CM

## 2017-05-03 DIAGNOSIS — I2510 Atherosclerotic heart disease of native coronary artery without angina pectoris: Secondary | ICD-10-CM | POA: Insufficient documentation

## 2017-05-03 DIAGNOSIS — M549 Dorsalgia, unspecified: Secondary | ICD-10-CM

## 2017-05-03 DIAGNOSIS — M545 Low back pain, unspecified: Secondary | ICD-10-CM

## 2017-05-03 DIAGNOSIS — Z79899 Other long term (current) drug therapy: Secondary | ICD-10-CM | POA: Insufficient documentation

## 2017-05-03 DIAGNOSIS — M79605 Pain in left leg: Secondary | ICD-10-CM | POA: Insufficient documentation

## 2017-05-03 DIAGNOSIS — M47894 Other spondylosis, thoracic region: Secondary | ICD-10-CM

## 2017-05-03 DIAGNOSIS — K219 Gastro-esophageal reflux disease without esophagitis: Secondary | ICD-10-CM | POA: Insufficient documentation

## 2017-05-03 DIAGNOSIS — R200 Anesthesia of skin: Secondary | ICD-10-CM | POA: Insufficient documentation

## 2017-05-03 DIAGNOSIS — G8929 Other chronic pain: Secondary | ICD-10-CM | POA: Insufficient documentation

## 2017-05-03 DIAGNOSIS — Z7982 Long term (current) use of aspirin: Secondary | ICD-10-CM | POA: Insufficient documentation

## 2017-05-03 DIAGNOSIS — K5090 Crohn's disease, unspecified, without complications: Secondary | ICD-10-CM | POA: Insufficient documentation

## 2017-05-03 DIAGNOSIS — M546 Pain in thoracic spine: Secondary | ICD-10-CM

## 2017-05-03 DIAGNOSIS — R29898 Other symptoms and signs involving the musculoskeletal system: Secondary | ICD-10-CM | POA: Insufficient documentation

## 2017-05-03 DIAGNOSIS — Z8673 Personal history of transient ischemic attack (TIA), and cerebral infarction without residual deficits: Secondary | ICD-10-CM | POA: Insufficient documentation

## 2017-05-03 DIAGNOSIS — Z6830 Body mass index (BMI) 30.0-30.9, adult: Secondary | ICD-10-CM

## 2017-05-03 NOTE — Progress Notes (Signed)
To be dictated  Santa Genera, MD 05/03/2017, 09:14

## 2017-05-04 NOTE — Progress Notes (Signed)
PATIENT NAME: Spencer [name], Spencer [name]  HOSPITAL NUMBER:  [id number]  DATE OF SERVICE: 05/03/2017  DATE OF BIRTH:  [birth year]    CLINIC NOTE    REFERRING PHYSICIAN:  Avie Arenas, MD    SUBJECTIVE:  Spencer [name] is a his 51 year old male with history of chronic low back pain and previous treatment including lumbar spine back on August 05, 2012, which showed some mild degenerative changes with some minimal disk bulging and mild facet changes without any herniated disk.  He relates that the pain is in his low back and midback, dates back to 2004.  He relates it started in [place].  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  Continue to have pain in the past 4 years.  He relates it has increased and especially worse in the last 2 years.  He relates that a constant pain across low back, a 4-10 sharp stabbing pain.  It is primarily down his left leg daily.  He relates the legs do not want to move until he stands up straight and left greater than right leg weakness.  No falls.  He relates that he also gets some difficulty starting urine at times.  Recent scope by Dr. Venia Minks and he had some peri area numbness.  No incontinence.  He relates he uses a TENS unit.  He has continued to work full time.  Did go through physical therapy through the [facility], finished March 2018 and did not really help much.    PAST MEDICAL HISTORY:  Coronary artery disease, irritable bowel syndrome, Crohn's, GERD, stroke in 2009, migraines, tinnitus, myocardial bridge.    PAST SURGICAL HISTORY:  Colonoscopy, laparotomy, heart catheterization.    CURRENT MEDICATIONS:  He is on gabapentin 300 mg at night for sleep, ibuprofen up to 3 a day, melatonin, aspirin, nitroglycerin p.r.n., Protonix, Imitrex, Desyrel and Ecotrin.    ALLERGIES:  He is allergic to BEE STINGS, ADHESIVE TAPE and WHEAT.    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  Does not smoke.  Does not drink any alcohol.    FAMILY HISTORY:  Positive for back troubles, high blood  pressure, diabetes, gout and dementia.    REVIEW OF SYSTEMS:  As above, he has some chronic cardiorespiratory problems, depression, anxiety, numbness, tingling, balance difficulties at time, occasionally tingling in the hands but relates hand function is normal.    PHYSICAL EXAMINATION:  On exam, height is 5 feet 11 inches, weight 100 kg, blood pressure 135/91, pulse 67 and regular.  He is alert, oriented, pleasant, cooperative.  Lungs:  Clear.  Heart:  Regular rate and rhythm.  Abdomen:  Bowel sounds positive.  Soft, nontender.  Back is tender primarily in the paralumbar region and the left buttocks.  He relates pain will radiate down the left leg to his inner left foot.  Strength in upper and lower extremities intact, 5/5 reflexes symmetric at 1-2/4.  No ankle clonus.  Negative Hoffmann's.  Negative straight leg raising.    IMPRESSION:  Chronic low back pain with radicular symptoms down both legs, saddle numbness and difficulty starting his urine.    IMAGING STUDIES:  Lumbar spine x-ray showed some mild degenerative changes without acute bony abnormalities.  Thoracic spine showed some mild degenerative changes without any acute abnormalities.  Cervical spine x-rays done today showed some mild degenerative changes without acute abnormalities.    IMPRESSION:  Lumbar degenerative disc disease, some persistent radicular symptoms down the left  greater than right leg.    PLAN:  He is going to ask his doctor about increasing his gabapentin as tolerated.  We will obtain a lumbar spine MRI and make additional recommendations after we get the new lumbar spine MRI.        Santa Genera, MD  Assistant Professor   [facility] Department of Orthopaedics            CC:   Avie Arenas, MD       DD:  05/03/2017 10:36:55  DT:  05/04/2017 09:25:50 MD  D#:  [id number]

## 2017-06-05 ENCOUNTER — Ambulatory Visit
Admission: RE | Admit: 2017-06-05 | Discharge: 2017-06-05 | Disposition: A | Discharge status: Home / Self Care | Source: Ambulatory Visit | Attending: PHYSICAL MEDICINE AND REHABILITATION | Admitting: PHYSICAL MEDICINE AND REHABILITATION

## 2017-06-05 DIAGNOSIS — S335XXA Sprain of ligaments of lumbar spine, initial encounter: Secondary | ICD-10-CM

## 2017-06-05 DIAGNOSIS — R29898 Other symptoms and signs involving the musculoskeletal system: Secondary | ICD-10-CM

## 2017-06-05 DIAGNOSIS — M545 Low back pain, unspecified: Secondary | ICD-10-CM

## 2017-06-05 DIAGNOSIS — M79605 Pain in left leg: Principal | ICD-10-CM

## 2017-06-05 DIAGNOSIS — M5136 Other intervertebral disc degeneration, lumbar region: Secondary | ICD-10-CM

## 2017-06-09 ENCOUNTER — Ambulatory Visit (INDEPENDENT_AMBULATORY_CARE_PROVIDER_SITE_OTHER): Payer: Self-pay | Admitting: PHYSICAL MEDICINE AND REHABILITATION

## 2017-06-09 DIAGNOSIS — M545 Low back pain, unspecified: Secondary | ICD-10-CM

## 2017-06-09 DIAGNOSIS — M79605 Pain in left leg: Principal | ICD-10-CM

## 2017-06-09 NOTE — Telephone Encounter (Signed)
Lumbar MRI Impression: Mild degenerative changes of the lumbar spine.    After reviewing the lumbar MRI, Dr. Burnadette Peter made the following recommendation:  Pain clinic consult      Phone call to patient.  Discussed lumbar MRI Impression and Dr. Emilee Hero recommendation of pain clinic consult.   Patient chose to accept recommendation and declined any questions.  Order placed.

## 2017-06-13 ENCOUNTER — Encounter (INDEPENDENT_AMBULATORY_CARE_PROVIDER_SITE_OTHER): Payer: Self-pay | Admitting: Family

## 2017-06-13 ENCOUNTER — Ambulatory Visit: Attending: Family | Admitting: Family

## 2017-06-13 VITALS — BP 133/86 | HR 65 | Temp 98.4°F | Resp 20 | Ht 69.72 in | Wt 216.1 lb

## 2017-06-13 DIAGNOSIS — M5137 Other intervertebral disc degeneration, lumbosacral region: Principal | ICD-10-CM | POA: Insufficient documentation

## 2017-06-13 DIAGNOSIS — Z8673 Personal history of transient ischemic attack (TIA), and cerebral infarction without residual deficits: Secondary | ICD-10-CM | POA: Insufficient documentation

## 2017-06-13 DIAGNOSIS — M545 Low back pain, unspecified: Secondary | ICD-10-CM

## 2017-06-13 DIAGNOSIS — I209 Angina pectoris, unspecified: Secondary | ICD-10-CM | POA: Insufficient documentation

## 2017-06-13 DIAGNOSIS — I739 Peripheral vascular disease, unspecified: Secondary | ICD-10-CM | POA: Insufficient documentation

## 2017-06-13 DIAGNOSIS — G43909 Migraine, unspecified, not intractable, without status migrainosus: Secondary | ICD-10-CM | POA: Insufficient documentation

## 2017-06-13 DIAGNOSIS — M79605 Pain in left leg: Secondary | ICD-10-CM

## 2017-06-13 DIAGNOSIS — E781 Pure hyperglyceridemia: Secondary | ICD-10-CM | POA: Insufficient documentation

## 2017-06-13 DIAGNOSIS — K5090 Crohn's disease, unspecified, without complications: Secondary | ICD-10-CM | POA: Insufficient documentation

## 2017-06-13 DIAGNOSIS — I10 Essential (primary) hypertension: Secondary | ICD-10-CM | POA: Insufficient documentation

## 2017-06-13 DIAGNOSIS — Z91030 Bee allergy status: Secondary | ICD-10-CM | POA: Insufficient documentation

## 2017-06-13 DIAGNOSIS — N400 Enlarged prostate without lower urinary tract symptoms: Secondary | ICD-10-CM | POA: Insufficient documentation

## 2017-06-13 DIAGNOSIS — I25119 Atherosclerotic heart disease of native coronary artery with unspecified angina pectoris: Secondary | ICD-10-CM | POA: Insufficient documentation

## 2017-06-13 DIAGNOSIS — Z791 Long term (current) use of non-steroidal anti-inflammatories (NSAID): Secondary | ICD-10-CM | POA: Insufficient documentation

## 2017-06-13 DIAGNOSIS — Z6831 Body mass index (BMI) 31.0-31.9, adult: Secondary | ICD-10-CM

## 2017-06-13 DIAGNOSIS — Z91018 Allergy to other foods: Secondary | ICD-10-CM | POA: Insufficient documentation

## 2017-06-13 DIAGNOSIS — M47816 Spondylosis without myelopathy or radiculopathy, lumbar region: Secondary | ICD-10-CM | POA: Insufficient documentation

## 2017-06-13 DIAGNOSIS — G8929 Other chronic pain: Secondary | ICD-10-CM | POA: Insufficient documentation

## 2017-06-13 DIAGNOSIS — Z79899 Other long term (current) drug therapy: Secondary | ICD-10-CM | POA: Insufficient documentation

## 2017-06-13 DIAGNOSIS — F4310 Post-traumatic stress disorder, unspecified: Secondary | ICD-10-CM | POA: Insufficient documentation

## 2017-06-13 DIAGNOSIS — Z91048 Other nonmedicinal substance allergy status: Secondary | ICD-10-CM | POA: Insufficient documentation

## 2017-06-13 DIAGNOSIS — K219 Gastro-esophageal reflux disease without esophagitis: Secondary | ICD-10-CM | POA: Insufficient documentation

## 2017-06-13 DIAGNOSIS — Z7982 Long term (current) use of aspirin: Secondary | ICD-10-CM | POA: Insufficient documentation

## 2017-06-13 NOTE — Progress Notes (Signed)
[facility]                              [facility]  [address] [place]  [place] [address]  [phone]         History and Physical                      [place] Controlled Substance Full Name Report Report Date 06/13/2017   From 06/13/2016 To 06/12/2017 Date of Birth 08/24/1966 Prescription Count 2   Last Name Yehle First Name Spencer [name] Middle Name                          M.E.D.D. Morphine Equivalent Daily Dose  [place] CSAPP/RxDataTrack Patient Reports  This is an "Active Cumulative Morphine Equivalent" score added to patient reports, when the patient has active prescriptions for opioids. This feature is meant to readily identify the potency comparison among different prescribed opioids as a single score describing the equivalent dose of morphine available to be taken on a daily basis. It is a guide ONLY.    According to CDC, a score of more than 50 requires caution and a score of 90 or more indicates a significant increase in the risk for an overdose.             ACTIVE MORPHINE EQUIVALENT      0                         Patients included in report that appear to match the search criteria.   Last Name First Name Middle Name Gender Address   Spencer [name] [address] , [place], [place], [address]                This table shows the prescriptions used to compute the MEDD score.                     MEDD Score Calculator   RX# Drug Strength Unit  Multiplier  Quantity  Days  MEDD                Cumulative Active Morphine Equivalent                           0                It does not reflect any information on the use of any other type of controlled substance such as benzodiazepines, stimulants, or sedatives. if a patient does not have an active prescription for an opiate no score will be visible on the report.                Prescriber Name Prescriber DEA & Zip Dispenser Name Dispenser DEA & Zip Rx Written Date Rx Dispense  Date  & Date Sold   Rx Number Product Name MEDD Status Strength Qty Days # of Refill Sched Payment Type   Spencer [name], [place] Md [id number] [phone] [id number] 05/12/2017 05/15/2017 05/15/2017 [id number] A Tramadol Hcl INACTIVE 50 mg/1 16 4  0/0 [employer]   Spencer Mattes Md [id number] [phone] [id number] [phone] 01/13/2017 01/13/2017 01/13/2017 [id number] Tramadol Hcl INACTIVE 50 mg/1 16 4  0/0 CIV [employer]  [employer] Pain Rating Scale     On a scale of 0-10, during the past 24 hours, pain has interfered with you usual activity: 6     On a scale of 0-10, during the past 24 hours, pain has interfered with your sleep: 6    On a scale of 0-10, during the past 24 hours, pain has affected your mood: 2     On a scale of 0-10, during the past 24 hours, pain has contributed to your stress: 8     On a scale of 0-10, what is your overall pain Rating: 6        Date of Service: 06/13/2017   Name: Spencer [name]   Date of Birth: 06-Nov-1966       Obtained History From: the patient    CHIEF COMPLAINT:   Chief Complaint   Patient presents with   . Lower Back Pain         PAIN PROCEDURES:  1.     IMAGES: July 2018  FINDINGS: The lumbar spine demonstrates normal alignment. Vertebral bodies  are normal in height. There is a normal marrow signal pattern.  Intervertebral discs are normal in height and signal intensity.    The conus medullaris terminates at a normal level and the nerve roots of  the cauda equina appear normal. The included paraspinal soft tissues and  retroperitoneal structures are grossly normal.     Evaluation of the individual levels demonstrates:    T12-L1: Unremarkable.    L1-2: Unremarkable.    L2-3: Unremarkable.    L3-4: Unremarkable.    L4-5: Mild facet degenerative changes.     L5-S1: Minimal broad-based disc bulge and annular fissure noted within the  left foraminal zone.   There are mild facet degenerative changes.  Mild  bilateral foraminal stenosis worse on the left side      IMPRESSION:   Mild degenerative changes of the lumbar spine.    HPI:  This is a 51 y.o. male who presents with low back pain.   Onset: when he was in [place]  Location: bilateral low back  Duration: daily  Characteristics: dull, pressure  Radiation: bilateral posterior legs  Aggravating factors: prolonged positioning  Alleviating factors: changing positiong  Associated symptoms: none  Timing: no timing    The patient has tried and failed conservative treatment of physical therapy, chiropractor, TENS, , acetaminophen, muscle relaxers, and gabapentin.     Pt has not had a prior spine fracture.  He has not had prior spine surgery     Pt has not had any loss of bowel or bladder or any myelopathy.  PAST MEDICAL/ FAMILY/ SOCIAL HISTORY:   Reviewed/ Summarized records and/or obtained History from patient  Past Medical History:   Diagnosis Date   . Anginal pain (CMS HCC) 06/03/2010   . Arthritis     spine   . Borderline diabetes mellitus 12/23/2007   . BPH (benign prostatic hyperplasia) 07/15/2011   . CAD (coronary artery disease) 06/03/2010   . Chest pain 12/23/2007   . Colitis    . Crohn's disease (CMS HCC) 06/14/2010   . CVA (cerebrovascular accident) (CMS [facility]) 12/2007    patient unsure   . GERD (gastroesophageal reflux disease) 06/14/2010   . HTN    . Hyperlipidemia 12/26/2007    denies cp sob or mi hx - no meds'   . Hypertriglyceridemia 06/03/2010   . IBS (irritable bowel syndrome) 06/14/2010   . Low back pain  radiating to left leg     L>R   . Migraines    . 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 PAD (peripheral artery disease) (CMS HCC) 06/03/2010   . Post traumatic stress disorder (PTSD)     currently being evaluated   . Reflux    . S/P cardiac catheterization 05/28/2010   . Spells 06/16/2008    patient denies   . Stress test 05/28/2010   . 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 Wears glasses          Current Outpatient Prescriptions   Medication Sig   . aspirin (ECOTRIN) 81  mg Oral Tablet, Delayed Release (E.C.) Take 81 mg by mouth Once a day   . gabapentin (NEURONTIN) 100 mg Oral Capsule Take 300 mg by mouth Every night    . Ibuprofen (MOTRIN) 800 mg Oral Tablet Take 800 mg by mouth Three times a day as needed for Pain   . ketoconazole (NIZORAL) 2 % Cream Apply topically Twice daily   . melatonin 10 mg Oral Capsule Take 1 Cap by mouth Every night as needed   . nitroglycerin (NITROSTAT) 0.4 mg Sublingual Tablet, Sublingual for 3 doses over 15 minutes   . pantoprazole (PROTONIX) 20 mg Oral Tablet, Delayed Release (E.C.) Take 20 mg by mouth Every morning before breakfast   . sumatriptan succinate (IMITREX) 100 mg Oral Tablet Take 100 mg by mouth Every 12 hours as needed for Migraine May repeat in 2 hours in needed   . traZODone (DESYREL) 100 mg Oral Tablet Take 100 mg by mouth Every night     Allergies   Allergen Reactions   . Bee Sting [Hymenoptera Allergenic Extract] Anaphylaxis and Shortness of Breath   . Adhesive      Red rash   . Wheat Swelling     Past Surgical History:   Procedure Laterality Date   . COLONOSCOPY  09/24/2010    COLONOSCOPY performed by [name] at [facility] OR ENDO   . HX HEART CATHETERIZATION     . HX LAPAROTOMY     . PB COLONOSCOPY,BIOPSY           Social History   Substance Use Topics   . Smoking status: Never Smoker   . Smokeless tobacco: Never Used   . Alcohol use No      Comment: rarely     Family Medical History     Problem Relation (Age of Onset)    Diabetes Brother    Hypertension Mother, Brother, Brother              Review of Systems:  All other systems negative except for any noted in the HPI    PHYSICAL EXAMINATION:  Vital Signs: BP 133/86  Pulse 65  Temp 36.9 C (98.4 F) (Oral)   Resp 20  Ht 1.771 m (5' 9.72")  Wt 98 kg (216 lb 0.8 oz)  SpO2 98%  BMI 31.25 kg/m2 Body mass index is 31.25 kg/(m^2).  General: appears in good health, appears stated age  Eyes: Conjunctiva clear, Pupils equal and round.   HENT: mucous membranes moist.  Lungs: No  accessory muscles, normal effort  Cardiovascular: +2 pulses X 4, no edema  Abdomen: Soft, non-tender, non-distended  Extremities: extremities normal, atraumatic, no cyanosis or edema, no redness or tenderness in the calves or thighs  Skin: Skin warm and dry and Skin color, texture, turgor normal. No rashes or lesions  Neurologic: Grossly normal, Gait is  normal. , CN II - XII grossly intact , Alert and oriented x3  Psychiatric: Normal affect, behavior, memory, thought content, judgement, and speech.  Musculoskeletal:   Gait and Station:Normal  Muscle Strength (upper extremeties): Not done  Muscle Strength (lower extremeties) : Normal  Muscle Tone (upper extremeties): Not done  Muscle Tone (lower extremeties): Normal  Sensation: Normal  Deep Tendon Reflexes: Normal  Hoffman's Reflex: Left: not tested Right: not tested  Ankle clonus: Left: Not present Right:Not present  Straight Leg Raise: Left: negative Right:negative  Crossed Straight Leg Raise: Left: negative Right: negative  Musculoskeletal Tenderness: positive  PSIS tenderness Left: positive Right:positive  Fortin Finger sign Left: negative Right:negative  Facet tenderness bilateral L4/5/S1    Patient reports no suicidal or homicidal ideations.    ASSESSMENT:    ICD-10-CM    1. 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 Low back pain radiating to left leg M54.5 Pain Clinic Consult [facility]   3. DDD (degenerative disc disease), lumbosacral M51.37    4. Chronic pain G89.29    low back pain  Lumbar spondylosis  DDD  Chronic pain    PLAN:  1. Discussed diagnostic L4/5/S1 LMBB to RFA-BCBS pt. I gave him information on these. He will think about and call if he wants to proceed  2. I offered chiropractic, movement, massage, and CBT-he has these available at the [facility]  3.  Continue HEP    Our impression/plan was discussed with the patient.  The patient had a opportunity to discuss their plan of care and all questions were answered. Pt  educated on red flags of back pain and when to the go to the ED.    The patient was informed that improving their general level of fitness, weight control and core strength would be beneficial in controlling their chronic pain.    The patient was seen independently with the cosigning faculty present in the clinic.    Charlette Caffey, APRN,NP-C, 06/13/2017, 10:32  Nurse Practitioner with [facility]

## 2017-07-08 ENCOUNTER — Encounter (HOSPITAL_COMMUNITY): Payer: Self-pay

## 2017-07-08 ENCOUNTER — Emergency Department (HOSPITAL_COMMUNITY)

## 2017-07-08 ENCOUNTER — Observation Stay (HOSPITAL_COMMUNITY)

## 2017-07-08 ENCOUNTER — Observation Stay
Admission: EM | Admit: 2017-07-08 | Discharge: 2017-07-09 | Disposition: A | Discharge status: Home / Self Care | Attending: Hospitalist | Admitting: Hospitalist

## 2017-07-08 ENCOUNTER — Observation Stay (HOSPITAL_COMMUNITY): Admitting: Hospitalist

## 2017-07-08 DIAGNOSIS — R079 Chest pain, unspecified: Principal | ICD-10-CM | POA: Insufficient documentation

## 2017-07-08 DIAGNOSIS — R209 Unspecified disturbances of skin sensation: Secondary | ICD-10-CM

## 2017-07-08 DIAGNOSIS — Z8673 Personal history of transient ischemic attack (TIA), and cerebral infarction without residual deficits: Secondary | ICD-10-CM

## 2017-07-08 DIAGNOSIS — R7303 Prediabetes: Secondary | ICD-10-CM

## 2017-07-08 DIAGNOSIS — R531 Weakness: Secondary | ICD-10-CM | POA: Insufficient documentation

## 2017-07-08 DIAGNOSIS — Z7982 Long term (current) use of aspirin: Secondary | ICD-10-CM | POA: Insufficient documentation

## 2017-07-08 DIAGNOSIS — I2510 Atherosclerotic heart disease of native coronary artery without angina pectoris: Secondary | ICD-10-CM | POA: Insufficient documentation

## 2017-07-08 DIAGNOSIS — M542 Cervicalgia: Secondary | ICD-10-CM | POA: Insufficient documentation

## 2017-07-08 DIAGNOSIS — N400 Enlarged prostate without lower urinary tract symptoms: Secondary | ICD-10-CM | POA: Insufficient documentation

## 2017-07-08 DIAGNOSIS — Z8249 Family history of ischemic heart disease and other diseases of the circulatory system: Secondary | ICD-10-CM | POA: Insufficient documentation

## 2017-07-08 DIAGNOSIS — F4310 Post-traumatic stress disorder, unspecified: Secondary | ICD-10-CM | POA: Insufficient documentation

## 2017-07-08 DIAGNOSIS — I739 Peripheral vascular disease, unspecified: Secondary | ICD-10-CM | POA: Insufficient documentation

## 2017-07-08 DIAGNOSIS — E781 Pure hyperglyceridemia: Secondary | ICD-10-CM | POA: Insufficient documentation

## 2017-07-08 DIAGNOSIS — R202 Paresthesia of skin: Secondary | ICD-10-CM | POA: Insufficient documentation

## 2017-07-08 DIAGNOSIS — K219 Gastro-esophageal reflux disease without esophagitis: Secondary | ICD-10-CM | POA: Insufficient documentation

## 2017-07-08 DIAGNOSIS — K5090 Crohn's disease, unspecified, without complications: Secondary | ICD-10-CM

## 2017-07-08 DIAGNOSIS — R51 Headache: Secondary | ICD-10-CM | POA: Insufficient documentation

## 2017-07-08 DIAGNOSIS — IMO0001 Reserved for inherently not codable concepts without codable children: Secondary | ICD-10-CM

## 2017-07-08 DIAGNOSIS — G4700 Insomnia, unspecified: Secondary | ICD-10-CM | POA: Insufficient documentation

## 2017-07-08 DIAGNOSIS — I10 Essential (primary) hypertension: Secondary | ICD-10-CM | POA: Insufficient documentation

## 2017-07-08 LAB — BASIC METABOLIC PANEL
ANION GAP: 9 mmol/L
BUN/CREA RATIO: 11
BUN: 11 mg/dL (ref 10–25)
CALCIUM: 9.1 mg/dL (ref 8.2–10.2)
CHLORIDE: 107 mmol/L (ref 98–111)
CO2 TOTAL: 23 mmol/L (ref 21–35)
CREATININE: 0.99 mg/dL (ref <=1.30)
ESTIMATED GFR: >60 mL/min/1.73mˆ2
GLUCOSE: 115 mg/dL — ABNORMAL HIGH (ref 70–110)
POTASSIUM: 4.4 mmol/L (ref 3.5–5.0)
SODIUM: 139 mmol/L (ref 135–145)

## 2017-07-08 LAB — CBC WITH DIFF
BASOPHIL #: 0.1 x10ˆ3/uL (ref 0.00–0.20)
BASOPHIL %: 1 %
EOSINOPHIL #: 0.1 x10ˆ3/uL (ref 0.00–0.50)
EOSINOPHIL %: 2 %
HCT: 39.6 % (ref 38.9–50.5)
HGB: 13.8 g/dL (ref 13.4–17.3)
LYMPHOCYTE #: 2.8 x10ˆ3/uL (ref 0.80–3.20)
LYMPHOCYTE %: 37 %
MCH: 30 pg (ref 27.9–33.1)
MCHC: 34.8 g/dL (ref 32.8–36.0)
MCV: 86.1 fL (ref 82.4–95.0)
MONOCYTE #: 0.5 x10ˆ3/uL (ref 0.20–0.80)
MONOCYTE %: 7 %
MPV: 9.2 fL (ref 6.0–10.2)
NEUTROPHIL #: 4.1 x10?3/uL (ref 1.60–5.50)
NEUTROPHIL %: 54 %
PLATELETS: 216 x10ˆ3/uL (ref 140–440)
RBC: 4.6 x10?6/uL (ref 4.40–5.68)
RDW: 13.8 % (ref 10.9–15.1)
WBC: 7.7 x10ˆ3/uL (ref 3.3–9.3)

## 2017-07-08 LAB — THYROID STIMULATING HORMONE (SENSITIVE TSH): TSH: 1.855 u[IU]/mL (ref 0.450–5.330)

## 2017-07-08 LAB — PTT (PARTIAL THROMBOPLASTIN TIME): APTT: 32.9 s (ref 25.0–37.0)

## 2017-07-08 LAB — PT/INR: INR: 1.06 (ref 0.90–1.10)

## 2017-07-08 LAB — TROPONIN-I
TROPONIN I: 0 ng/mL (ref <=0.04)
TROPONIN I: 0 ng/mL (ref <=0.04)

## 2017-07-08 LAB — THYROXINE, TOTAL T4: T4 TOTAL: 7.1 ug/dL (ref 6.1–12.2)

## 2017-07-08 MED ORDER — IBUPROFEN 400 MG TABLET
800.00 mg | ORAL_TABLET | Freq: Three times a day (TID) | ORAL | Status: DC | PRN
Start: 2017-07-08 — End: 2017-07-09
  Filled 2017-07-08: qty 2

## 2017-07-08 MED ORDER — SODIUM CHLORIDE 0.9 % (FLUSH) INJECTION SYRINGE
3.0000 mL | INJECTION | Freq: Three times a day (TID) | INTRAMUSCULAR | Status: DC
Start: 2017-07-08 — End: 2017-07-09
  Administered 2017-07-08: 0 mL
  Administered 2017-07-08 – 2017-07-09 (×2): 3 mL

## 2017-07-08 MED ORDER — ENOXAPARIN 40 MG/0.4 ML SUBCUTANEOUS SYRINGE
40.0000 mg | INJECTION | SUBCUTANEOUS | Status: DC
Start: 2017-07-08 — End: 2017-07-09
  Administered 2017-07-08: 40 mg via SUBCUTANEOUS
  Filled 2017-07-08 (×3): qty 0

## 2017-07-08 MED ORDER — POLYETHYLENE GLYCOL 3350 17 GRAM ORAL POWDER PACKET
17.0000 g | Freq: Every day | ORAL | Status: DC | PRN
Start: 2017-07-08 — End: 2017-07-09
  Filled 2017-07-08: qty 1

## 2017-07-08 MED ORDER — GADOBENATE DIMEGLUMINE 529 MG/ML(0.1 MMOL/0.2 ML) INTRAVENOUS SOLUTION
10.00 mL | INTRAVENOUS | Status: AC
Start: 2017-07-08 — End: 2017-07-08
  Administered 2017-07-08: 10 mL via INTRAVENOUS

## 2017-07-08 MED ORDER — NITROGLYCERIN 0.4 MG SUBLINGUAL TABLET
0.40 mg | SUBLINGUAL_TABLET | SUBLINGUAL | Status: DC | PRN
Start: 2017-07-08 — End: 2017-07-09

## 2017-07-08 MED ORDER — SODIUM CHLORIDE 0.9 % INJECTION SOLUTION
10.00 mL | INTRAMUSCULAR | Status: DC
Start: 2017-07-08 — End: 2017-07-08

## 2017-07-08 MED ORDER — ASPIRIN 81 MG TABLET,DELAYED RELEASE
81.0000 mg | DELAYED_RELEASE_TABLET | Freq: Every day | ORAL | Status: DC
Start: 2017-07-09 — End: 2017-07-09
  Filled 2017-07-08 (×3): qty 1

## 2017-07-08 MED ORDER — MORPHINE 2 MG/ML INTRAVENOUS CARTRIDGE
1.00 mg | CARTRIDGE | INTRAVENOUS | Status: AC
Start: 2017-07-08 — End: 2017-07-08
  Administered 2017-07-08: 1 mg via INTRAVENOUS
  Filled 2017-07-08: qty 1

## 2017-07-08 MED ORDER — IOVERSOL 350 MG IODINE/ML INTRAVENOUS SOLUTION
120.00 mL | INTRAVENOUS | Status: AC
Start: 2017-07-08 — End: 2017-07-08
  Administered 2017-07-08: 120 mL via INTRAVENOUS

## 2017-07-08 MED ORDER — SODIUM CHLORIDE 0.9 % IV BOLUS
1000.00 mL | INJECTION | Status: AC
Start: 2017-07-08 — End: 2017-07-08
  Administered 2017-07-08: 0 mL via INTRAVENOUS
  Administered 2017-07-08: 1000 mL via INTRAVENOUS

## 2017-07-08 MED ORDER — ONDANSETRON HCL (PF) 4 MG/2 ML INJECTION SOLUTION
4.00 mg | Freq: Three times a day (TID) | INTRAMUSCULAR | Status: DC | PRN
Start: 2017-07-08 — End: 2017-07-09

## 2017-07-08 MED ORDER — MORPHINE 4 MG/ML INTRAVENOUS CARTRIDGE
4.00 mg | CARTRIDGE | INTRAVENOUS | Status: AC
Start: 2017-07-08 — End: 2017-07-08
  Administered 2017-07-08: 4 mg via INTRAVENOUS
  Filled 2017-07-08: qty 1

## 2017-07-08 MED ORDER — SODIUM CHLORIDE 0.9 % (FLUSH) INJECTION SYRINGE
125.00 mL | INJECTION | INTRAMUSCULAR | Status: DC
Start: 2017-07-08 — End: 2017-07-08

## 2017-07-08 MED ORDER — GABAPENTIN 300 MG CAPSULE
300.00 mg | ORAL_CAPSULE | Freq: Every evening | ORAL | Status: DC
Start: 2017-07-08 — End: 2017-07-09
  Administered 2017-07-08: 300 mg via ORAL
  Filled 2017-07-08 (×3): qty 1

## 2017-07-08 MED ORDER — CIPROFLOXACIN 0.3 %-DEXAMETHASONE 0.1 % EAR DROPS,SUSPENSION
4.00 [drp] | Freq: Two times a day (BID) | OTIC | Status: DC
Start: 2017-07-08 — End: 2017-07-09
  Administered 2017-07-08 – 2017-07-09 (×2): 4 [drp] via OTIC
  Filled 2017-07-08: qty 8

## 2017-07-08 MED ORDER — ASPIRIN 81 MG CHEWABLE TABLET
324.00 mg | CHEWABLE_TABLET | ORAL | Status: AC
Start: 2017-07-08 — End: 2017-07-08
  Administered 2017-07-08: 324 mg via ORAL
  Filled 2017-07-08: qty 4

## 2017-07-08 MED ORDER — TRAZODONE 100 MG TABLET
100.00 mg | ORAL_TABLET | Freq: Every evening | ORAL | Status: DC
Start: 2017-07-08 — End: 2017-07-09
  Filled 2017-07-08 (×3): qty 1

## 2017-07-08 MED ORDER — SODIUM CHLORIDE 0.9 % (FLUSH) INJECTION SYRINGE
3.0000 mL | INJECTION | INTRAMUSCULAR | Status: DC | PRN
Start: 2017-07-08 — End: 2017-07-09

## 2017-07-08 MED ORDER — SUMATRIPTAN 25 MG TABLET
100.00 mg | ORAL_TABLET | Freq: Two times a day (BID) | ORAL | Status: DC | PRN
Start: 2017-07-08 — End: 2017-07-09
  Administered 2017-07-08: 100 mg via ORAL
  Filled 2017-07-08 (×3): qty 1

## 2017-07-08 MED ORDER — PANTOPRAZOLE 20 MG TABLET,DELAYED RELEASE
20.00 mg | DELAYED_RELEASE_TABLET | Freq: Every morning | ORAL | Status: DC
Start: 2017-07-09 — End: 2017-07-09
  Administered 2017-07-09: 20 mg via ORAL
  Filled 2017-07-08 (×3): qty 1

## 2017-07-08 MED ADMIN — SUMAtriptan 25 mg tablet: ORAL | @ 16:00:00

## 2017-07-08 MED ADMIN — insulin lispro 100 unit/mL subcutaneous solution: INTRAVENOUS | @ 11:00:00

## 2017-07-08 MED ADMIN — SODIUM CHLORIDE 0.9 % W/ ADDITIVES: ORAL | @ 21:00:00

## 2017-07-08 NOTE — H&P (Signed)
[facility]  [facility]  Hospitalist  History and Physical    Spencer [name]  Date of Admission:  07/08/2017  Date of Birth:  [birth year]    PCP: [name], DO  Chief Complaint:  Chest pain    HPI: Spencer [name] is [name] 51 y.o. male who presents to [facility] for further evaluation of left shoulder/neck pain. Patient states overnight he had some pain that started in his left scalp with radiation down to his left neck to his left arm into his left chest with associated numbness.  He does have history of headaches and you takes Imitrex for this.  His typical headache is [name] bifrontal headache with some radiation to his occiput.  He has [name] history of some anginal pain in the past for which he has had MPS and catheterization which demonstrated myocardial bridging in 2009/2010.  He has previously followed with Dr. Myer Peer for his cardiology.      Past Medical History:   Diagnosis Date   . Anginal pain (CMS HCC) 06/03/2010   . Arthritis     spine   . Borderline diabetes mellitus 12/23/2007   . BPH (benign prostatic hyperplasia) 07/15/2011   . CAD (coronary artery disease) 06/03/2010   . Chest pain 12/23/2007   . Colitis    . Crohn's disease (CMS HCC) 06/14/2010   . CVA (cerebrovascular accident) (CMS [facility]) 12/2007    patient unsure   . GERD (gastroesophageal reflux disease) 06/14/2010   . HTN    . Hyperlipidemia 12/26/2007    denies cp sob or mi hx - no meds'   . Hypertriglyceridemia 06/03/2010   . IBS (irritable bowel syndrome) 06/14/2010   . Low back pain radiating to left leg     L>R   . Migraines    . 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 PAD (peripheral artery disease) (CMS HCC) 06/03/2010   . Post traumatic stress disorder (PTSD)     currently being evaluated   . Reflux    . S/P cardiac catheterization 05/28/2010   . Spells 06/16/2008    patient denies   . Stress test 05/28/2010   . 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 Wears glasses          Past Surgical History:   Procedure Laterality Date   . COLONOSCOPY   09/24/2010    COLONOSCOPY performed by [name] at [facility] OR ENDO   . HX HEART CATHETERIZATION     . HX LAPAROTOMY     . PB COLONOSCOPY,BIOPSY           Allergies   Allergen Reactions   . Bee Sting [Hymenoptera Allergenic Extract] Anaphylaxis and Shortness of Breath   . Adhesive      Red rash   . Wheat Swelling     Social History   Substance Use Topics   . Smoking status: Never Smoker   . Smokeless tobacco: Never Used   . Alcohol use No      Comment: rarely     Medications Prior to Admission     Prescriptions    aspirin (ECOTRIN) 81 mg Oral Tablet, Delayed Release (E.C.)    Take 81 mg by mouth Once [name] day    gabapentin (NEURONTIN) 100 mg Oral Capsule    Take 300 mg by mouth Every night     Ibuprofen (MOTRIN) 800 mg Oral Tablet    Take 800 mg by mouth Three times  [name] day as needed for Pain    ketoconazole (NIZORAL) 2 % Cream    Apply topically Twice daily    melatonin 10 mg Oral Capsule    Take 1 Cap by mouth Every night as needed    nitroglycerin (NITROSTAT) 0.4 mg Sublingual Tablet, Sublingual    for 3 doses over 15 minutes    pantoprazole (PROTONIX) 20 mg Oral Tablet, Delayed Release (E.C.)    Take 20 mg by mouth Every morning before breakfast    sumatriptan succinate (IMITREX) 100 mg Oral Tablet    Take 100 mg by mouth Every 12 hours as needed for Migraine May repeat in 2 hours in needed    traZODone (DESYREL) 100 mg Oral Tablet    Take 100 mg by mouth Every night        Family Medical History     Problem Relation (Age of Onset)    Diabetes Brother    Hypertension Mother, Brother, Brother              ROS:   Constitutional: No fever, chills or weakness   Skin: No rash or diaphoresis  HENT: No headaches or congestion  Eyes: No vision changes   Cardio:  As per HPI  Respiratory: No cough, wheezing or SOB  GI:  No nausea, vomiting or stool changes  GU:  No urinary changes  MSK: No joint or back pain  Neuro: No seizures or LOC  Psychiatric: No depression, SI or substance abuse  All other systems reviewed and are  negative.    EXAM:  Vitals: BP 106/75  Pulse 52  Temp 36.2 C (97.2 F)  Resp 18  Ht 1.778 m (5\' 10" )  Wt 97.5 kg (215 lb)  SpO2 97%  BMI 30.85 kg/m2  General: appears in good health and no distress  Eyes: Conjunctiva clear.  HENT:ENT without erythema or injection, mucous membranes moist.  Neck: No JVD or thyromegaly or lymphadenopathy  Lungs: clear to auscultation bilaterally.   Cardiovascular:    Heart bradycardic with regular rhythm  Abdomen: soft, non-tender  Extremities: no cyanosis or edema  Skin: Skin warm and dry  Neurologic: alert and oriented x3  Psychiatric: normal affect, behavior, memory, thought content, judgement, and speech.     Labs:    I have reviewed all lab results.  Lab Results for Last 24 Hours:    Results for orders placed or performed during the hospital encounter of 07/08/17 (from the past 24 hour(s))   ECG 12 LEAD - ED USE   Result Value Ref Range    Heart Rate 60 BPM    PR Interval 196 ms    QRS Duration 85 ms    QT Interval 403 ms    QTC Calculation 403 ms    Calculated P Axis 32 deg    QRS Axis 6 deg    Calculated T Axis 32 deg    I 40 Axis -8 deg    T 40 Axis 24 deg    ST Axis 20 deg    EKG Severity - NORMAL ECG -    BASIC METABOLIC PANEL   Result Value Ref Range    SODIUM 139 135 - 145 mmol/L    POTASSIUM 4.4 3.5 - 5.0 mmol/L    CHLORIDE 107 98 - 111 mmol/L    CO2 TOTAL 23 21 - 35 mmol/L    ANION GAP 9 mmol/L    CALCIUM 9.1 8.2 - 10.2 mg/dL    GLUCOSE 578 (H)  70 - 110 mg/dL    BUN 11 10 - 25 mg/dL    CREATININE 2.80 <=0.34 mg/dL    BUN/CREA RATIO 11     ESTIMATED GFR >60 Avg: 93 mL/min/1.50m^2   PT/INR   Result Value Ref Range    INR 1.06 0.90 - 1.10   PTT (PARTIAL THROMBOPLASTIN TIME)   Result Value Ref Range    APTT 32.9 25.0 - 37.0 seconds   THYROID STIMULATING HORMONE (SENSITIVE TSH)   Result Value Ref Range    TSH 1.855 0.450 - 5.330 uIU/mL   THYROXINE, TOTAL T4   Result Value Ref Range    T4 TOTAL 7.1 6.1 - 12.2 ug/dL   TROPONIN-I   Result Value Ref Range    TROPONIN I  0.00 <=0.04 ng/mL   CBC WITH DIFF   Result Value Ref Range    WBC 7.7 3.3 - 9.3 x10^3/uL    RBC 4.60 4.40 - 5.68 x10^6/uL    HGB 13.8 13.4 - 17.3 g/dL    HCT 91.7 91.5 - 05.6 %    MCV 86.1 82.4 - 95.0 fL    MCH 30.0 27.9 - 33.1 pg    MCHC 34.8 32.8 - 36.0 g/dL    RDW 97.9 48.0 - 16.5 %    PLATELETS 216 140 - 440 x10^3/uL    MPV 9.2 6.0 - 10.2 fL    NEUTROPHIL % 54 %    LYMPHOCYTE % 37 %    MONOCYTE % 7 %    EOSINOPHIL % 2 %    BASOPHIL % 1 %    NEUTROPHIL # 4.10 1.60 - 5.50 x10^3/uL    LYMPHOCYTE # 2.80 0.80 - 3.20 x10^3/uL    MONOCYTE # 0.50 0.20 - 0.80 x10^3/uL    EOSINOPHIL # 0.10 0.00 - 0.50 x10^3/uL    BASOPHIL # 0.10 0.00 - 0.20 x10^3/uL       Imaging Studies:    Results for orders placed or performed during the hospital encounter of 07/08/17 (from the past 24 hour(s))   XR CHEST PA AND LATERAL     Status: None    Narrative    Sherrick [name] Lockamy    PROCEDURE DESCRIPTION: XR CHEST PA AND LATERAL    PROCEDURE PERFORMED DATE AND TIME: 07/08/2017 11:14 AM    CLINICAL INDICATION: Chest pain    TECHNIQUE: 2 views / 2 images submitted.    COMPARISON: 03/16/2007      FINDINGS:     PA and lateral projections of the chest show the cardiac size to be within  normal limits. The mediastinal silhouette is unremarkable. There is no  infiltrate or pleural effusion. There is no evidence for pneumothorax. Bony  structures appear intact.      Impression    No acute process.     CT ANGIO INTRA-EXTRA CRANIAL W/WO CONTRAST     Status: None    Narrative    Wilburt [name] Valeri    PROCEDURE DESCRIPTION: CT ANGIO INTRA-EXTRA CRANIAL W/WO CONTRAST    PROCEDURE PERFORMED DATE AND TIME: 07/08/2017 12:33 PM    CLINICAL INDICATION: Headache, left-sided weakness    COMPARISON: None      FINDINGS:     There is no evidence for intracranial mass-effect or hematoma. The   ventricular system remains midline and is within normal limits for size,   configuration, and symmetry. There is an old lacunar infarct or prominent  perivascular space at the right  basal ganglia. Review of the bone windows  shows no   displaced fracture and the visualized paranasal sinuses are clear.       Impression    No acute intracranial process on initial unenhanced head CT.  Question old infarct at the right basal ganglia versus prominent  perivascular space.    CTA was performed with intravenous contrast. No mass, lymphadenopathy or  abnormal fluid collection is present. The parotid glands and submandibular  salivary glands maintain [name] normal symmetric appearance.    The right common carotid artery is patent with no stenosis. The carotid  bifurcation on the right is unremarkable. The right internal and external  carotid arteries are patent with no stenosis. The left common carotid  artery is patent with no stenosis. The carotid bifurcation on the left is  unremarkable. The left internal and external carotid arteries are patent  with no stenosis. Both vertebral arteries are patent.    Intracranially, the internal carotid arteries are unremarkable. The  anterior cerebral and middle cerebral arteries are patent bilaterally.  There is no thrombus, aneurysm, or stenosis. The vertebrobasilar system is  patent. Both posterior cerebral arteries are patent and originate from the  tip of the basilar artery.    IMPRESSION: No abnormality.       Assessment/Plan:   Active Hospital Problems    Diagnosis   . Primary Problem: Chest pain     Left chest pain  - admitted OPO  - serial troponin  - monitor on telemetry  - continue aspirin  - check an echocardiogram  - have given the patient the option to either have stress test on Monday versus consult Cardiology versus outpatient follow-up with Dr. Myer Peer.    Headache  - Imitrex p.r.n.  - given his age of above 51 and [name] changing headache will check an MRI for secondary causes.      Insomnia  - continue trazodone and gabapentin        DNR Status:  Full Code    DVT/PE Prophylaxis: Enoxaparin    Armen Pickup, MD

## 2017-07-08 NOTE — Care Plan (Signed)
Problem: Patient Care Overview (Adult,OB)  Goal: Plan of Care Review(Adult,OB)  The patient and/or their representative will communicate an understanding of their plan of care   Outcome: Ongoing (see interventions/notes)  Goal: Individualization/Patient Specific Goal(Adult/OB)  Outcome: Ongoing (see interventions/notes)  Goal: Interdisciplinary Rounds/Family Conf  Outcome: Ongoing (see interventions/notes)    Problem: Cardiac: ACS (Acute Coronary Syndrome) (Adult)  Prevent and manage potential problems including: 1. cardiovascular structural defects 2. chest pain (angina) 3. dysrhythmia/arrhythmia 4. embolism 5. heart failure/shock 6. ischemia leading to infarction 7. pericarditis 8. situational response  Goal: Signs and Symptoms of Listed Potential Problems Will be Absent, Minimized or Managed (Cardiac: ACS)  Signs and symptoms of listed potential problems will be absent, minimized or managed by discharge/transition of care (reference Cardiac: ACS (Acute Coronary Syndrome) (Adult) CPG).  Outcome: Ongoing (see interventions/notes)

## 2017-07-08 NOTE — Nurses Notes (Signed)
VSS. C/o of Headache. Hospitalist notified and orders received. Denies further chestpain. Will continue to monitor closely.

## 2017-07-08 NOTE — Care Plan (Signed)
Problem: Cardiac: ACS (Acute Coronary Syndrome) (Adult)  Prevent and manage potential problems including:  1. cardiovascular structural defects  2. chest pain (angina)  3. dysrhythmia/arrhythmia  4. embolism  5. heart failure/shock  6. ischemia leading to infarction  7. pericarditis  8. situational response   Goal: Signs and Symptoms of Listed Potential Problems Will be Absent, Minimized or Managed (Cardiac: ACS)  Signs and symptoms of listed potential problems will be absent, minimized or managed by discharge/transition of care (reference Cardiac: ACS (Acute Coronary Syndrome) (Adult) CPG).   Outcome: Completed Date Met: 07/08/17

## 2017-07-08 NOTE — ED Attending Note (Signed)
[facility]  Emergency Department  Attending Note    Identification:  Name: Spencer [name]  Age and Gender: 51 y.o. male  Date of Birth: 12/31/1965  Date of Service: 07/08/2017  MRN: [id number]  PCP: [name], DO    Code Status: full    I was physically present and directly supervised this patients care. Patient seen and examined with the resident/MLP, Crum, and history and exam reviewed. Key elements in addition to and/or correction of that documentation are as follows:    CC:  Chest pain  Paresthesias    HPI:  In brief, Spencer [name] is a 51 y.o. Two or more races male presenting with chest pain, left arm paresthesias, headache. Patient notes he woke this morning with headache and paresthesias in his left arm with some chest discomfort in the left side of his chest.  Patient notes the paresthesias are intermittent it and come in waves.  Medical history significant for diabetes, hypertension, hyperlipidemia, CVA, peripheral artery disease.  Last stress test in 2009.  Patient denies any double vision, blurry vision, shortness of breath, nausea, vomiting, diarrhea, melena, hematochezia, focal neurological deficits.  Patient denies any alcohol, tobacco, illicit drugs.    Further historical details can be found in the resident/MLP note.    Review of Systems:  Constitutional: - fever, - chills, - weakness   Skin: - rash, - diaphoresis  HENT: + headaches, - congestion  Eyes: - vision changes   Cardiovascular: + chest pain, - palpitations, - edema  Respiratory: - cough, - wheezing, - SOB  GI: - nausea, - vomiting, - diarrhea, - constipation, - abdominal pain  GU: - dysuria, - hematuria, - polyuria  MSK: - joint pain, - back pain  Neuro: - loss of sensation, - confusion, - focal deficits, + numbness, - tingling  Psychiatric: - mood changes. - SI/HI/AVH.   All other systems reviewed and are negative.    Medications:  Medications reviewed with patient/patient's family.  Medications Prior to Admission      Prescriptions    aspirin (ECOTRIN) 81 mg Oral Tablet, Delayed Release (E.C.)    Take 81 mg by mouth Once a day    gabapentin (NEURONTIN) 100 mg Oral Capsule    Take 300 mg by mouth Every night     Ibuprofen (MOTRIN) 800 mg Oral Tablet    Take 800 mg by mouth Three times a day as needed for Pain    ketoconazole (NIZORAL) 2 % Cream    Apply topically Twice daily    melatonin 10 mg Oral Capsule    Take 1 Cap by mouth Every night as needed    nitroglycerin (NITROSTAT) 0.4 mg Sublingual Tablet, Sublingual    for 3 doses over 15 minutes    pantoprazole (PROTONIX) 20 mg Oral Tablet, Delayed Release (E.C.)    Take 20 mg by mouth Every morning before breakfast    sumatriptan succinate (IMITREX) 100 mg Oral Tablet    Take 100 mg by mouth Every 12 hours as needed for Migraine May repeat in 2 hours in needed    traZODone (DESYREL) 100 mg Oral Tablet    Take 100 mg by mouth Every night          Allergies:  Allergies reviewed with patient/patient's family.  Allergies   Allergen Reactions   . Bee Sting [Hymenoptera Allergenic Extract] Anaphylaxis and Shortness of Breath   . Adhesive      Red rash   . Wheat Swelling  PMSFSH:  Past medical, surgical, family and social history reviewed with patient/patient's family.  Past Medical History:   Diagnosis Date   . Anginal pain (CMS HCC) 06/03/2010   . Arthritis     spine   . Borderline diabetes mellitus 12/23/2007   . BPH (benign prostatic hyperplasia) 07/15/2011   . CAD (coronary artery disease) 06/03/2010   . Chest pain 12/23/2007   . Colitis    . Crohn's disease (CMS HCC) 06/14/2010   . CVA (cerebrovascular accident) (CMS [facility]) 12/2007    patient unsure   . GERD (gastroesophageal reflux disease) 06/14/2010   . HTN    . Hyperlipidemia 12/26/2007    denies cp sob or mi hx - no meds'   . Hypertriglyceridemia 06/03/2010   . IBS (irritable bowel syndrome) 06/14/2010   . Low back pain radiating to left leg     L>R   . Migraines    . 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 PAD (peripheral artery disease)  (CMS HCC) 06/03/2010   . Post traumatic stress disorder (PTSD)     currently being evaluated   . Reflux    . S/P cardiac catheterization 05/28/2010   . Spells 06/16/2008    patient denies   . Stress test 05/28/2010   . 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 Wears glasses      Past Surgical History:   Procedure Laterality Date   . Colonoscopy  09/24/2010   . Hx heart catheterization     . Hx laparotomy     . Pb colonoscopy,biopsy       Family History   Problem Relation Age of Onset   . Hypertension Mother    . Hypertension Brother    . Diabetes Brother    . Hypertension Brother      Social History     Social History   . Marital status: Married     Spouse name: Alvy Beal   . Number of children: 6   . Years of education: 25     Occupational History   . social worker [employer]     Social History Main Topics   . Smoking status: Never Smoker   . Smokeless tobacco: Never Used   . Alcohol use No      Comment: rarely   . Drug use: No   . Sexual activity: Yes     Partners: Female     Birth control/ protection: None     Other Topics Concern   . Routine Exercise No   . Ability To Walk 2 Flight Of Steps Without Sob/Cp Yes   . Ability To Do Own Adl's Yes     Social History Narrative       Pertinent Physical Exam:  BP 109/75  Pulse 54  Temp 36.2 C (97.2 F)  Resp 13  Ht 1.778 m (5\' 10" )  Wt 97.5 kg (215 lb)  SpO2 94%  BMI 30.85 kg/m2  Constitutional: NAD. A&Ox3. Good historian. Wife at bedside.  Head: NC/AT. MMM. PERRLA. EOMI.  Neck: No JVD. No adenopathy.  Heart: RRR. No m/r/g.  Lungs: CTAB. No distress.  Abdomen: +BS. Soft. ND. Diffuse abd ttp without peritoneal signs, which patient states is normal and his baseline.  Musculoskeletal: No deformity or edema.  Skin: Warm and dry. No rash, erythema, jaundice, cyanosis.  Neurologic: Facies symmetric. Able to raise eyebrows, close eyes, smile, puff cheeks, stick out tongue, move tongue left and right, and raise  palate symmetrically. Able to shrug shoulders  symmetrically. PERRLA. EOMI. SILT to forehead, below eye, and at jawline. Can hear soft noise bilaterally. Good finger to nose. Gait with normal base. Shows normal tandem, heel, and toe walking.Pronator drift negative. Reflexes 2+ bilaterally with plantar reflexes downgoing. Mental status oriented x3. Patient notes paresthesias to the left upper extremity and no dermatomal distribution.  He does have slightly decreased strength on the left with elbow flexion extension however there is no other noted weakness.  On the left, elbow flexion extension is 4/5 when compared to the right.  Psychiatric: Normal affect and mood.    I have seen and physically examined the patient. I agree with the physical exam as documented in Crum's note.    Labs:   Reviewed    Imaging:   Reviewed    EKG:   Reviewed    Course:  Patient seen and examined.  Vital signs stable and within normal limits. Physical exam as above.  Baseline lab work without abnormality.  EKG without abnormality.  Chest x-ray shows no evidence of pneumonia, effusion, pneumothorax.  CTA intra and extracranial without any abnormality.  Results were discussed with the patient.  He is given the opportunity to ask questions.  Given the patient's moderate risk heart score, he will be admitted to medicine hospitalist service for further evaluation management of chest pain. All questions answered.  Patient agreeable plan.    Consults:   Medicine hospitalist    Impression:   Encounter Diagnoses   Name Primary?   . Chest pain, unspecified type Yes   . Paresthesias/numbness        Disposition:    Patient will be admitted to medicine hospitalist service for further evaluation and management.      Etta Quill, MD  07/08/2017, 13:25   [place] Department of Emergency Medicine    Parts of this patient's chart was completed in a retrospective fashion due to simultaneous direct patient care in the Emergency Department.   This note was partially generated using MModal Fluency Direct  system, and there may be some incorrect words, spelling, and punctuation that were not noted before saving.

## 2017-07-08 NOTE — ED Provider Notes (Signed)
Department of Emergency Medicine        History & Physical     Name: Spencer [name]  Age and Gender: 51 y.o. male  Date of Birth: 1966/08/08  Date of Service: 07/08/2017  MRN: [id number]  PCP: Spencer Beecham, DO      CC:  numbness    HPI:  Spencer [name] is a 51 y.o. male who presents with numbness onset 0200 this morning. Pt reports numbness is to LUE. Pt reports he initially experienced L shoulder stiffness that turned into a dull ache before finally becoming intermittent numbness. Pt reports associated L sided chest pain that is improving. Pt reports he has Nitro at home, but did not take, because "chest pain was not that bad". Pt reports Hx of PAD, CAD, Crohn's disease, HTN, hyperlipidemia, and CVA. Pt reports last heart catheterization was conducted in 2009. Pt denies anticoagulant use. Pt reports he follows with Spencer [name] of cardiology. Pt denies smoking. Pt reports associated gradual HA located to frontal region and back of head, pressure in BL ears, and upper abd pain which is normal. Pt has no other complaints at this time.  :        ROS:  Constitutional: No fever or weakness   Skin: No rash or diaphoresis  HEENT: No  Congestion +HA  Eyes: No vision changes   Ears: +pressure  Cardio: No  palpitations or leg swelling +chest pain   Respiratory: No cough, wheezing or SOB  GI:  No nausea, vomiting, diarrhea, constipation +abd pain   GU:  No dysuria, hematuria, polyuria  MSK: No joint or back pain  Neuro: No  confusion, focal deficits +numbness  Psychiatric: No mood changes  All other systems negative.    Medications:  Medications Prior to Admission     Prescriptions    aspirin (ECOTRIN) 81 mg Oral Tablet, Delayed Release (E.C.)    Take 81 mg by mouth Once a day    gabapentin (NEURONTIN) 100 mg Oral Capsule    Take 300 mg by mouth Every night     Ibuprofen (MOTRIN) 800 mg Oral Tablet    Take 800 mg by mouth Three times a day as needed for Pain    ketoconazole (NIZORAL) 2 % Cream    Apply topically  Twice daily    melatonin 10 mg Oral Capsule    Take 1 Cap by mouth Every night as needed    nitroglycerin (NITROSTAT) 0.4 mg Sublingual Tablet, Sublingual    for 3 doses over 15 minutes    pantoprazole (PROTONIX) 20 mg Oral Tablet, Delayed Release (E.C.)    Take 20 mg by mouth Every morning before breakfast    sumatriptan succinate (IMITREX) 100 mg Oral Tablet    Take 100 mg by mouth Every 12 hours as needed for Migraine May repeat in 2 hours in needed    traZODone (DESYREL) 100 mg Oral Tablet    Take 100 mg by mouth Every night          Allergies:  Allergies   Allergen Reactions   . Bee Sting [Hymenoptera Allergenic Extract] Anaphylaxis and Shortness of Breath   . Adhesive      Red rash   . Wheat Swelling     Allergies reviewed with patient    Past Medical History:   Diagnosis Date   . Anginal pain (CMS HCC) 06/03/2010   . Arthritis     spine   . Borderline diabetes mellitus 12/23/2007   .  BPH (benign prostatic hyperplasia) 07/15/2011   . CAD (coronary artery disease) 06/03/2010   . Chest pain 12/23/2007   . Colitis    . Crohn's disease (CMS HCC) 06/14/2010   . CVA (cerebrovascular accident) (CMS [name]) 12/2007    patient unsure   . GERD (gastroesophageal reflux disease) 06/14/2010   . HTN    . Hyperlipidemia 12/26/2007    denies cp sob or mi hx - no meds'   . Hypertriglyceridemia 06/03/2010   . IBS (irritable bowel syndrome) 06/14/2010   . Low back pain radiating to left leg     L>R   . Migraines    . 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 PAD (peripheral artery disease) (CMS HCC) 06/03/2010   . Post traumatic stress disorder (PTSD)     currently being evaluated   . Reflux    . S/P cardiac catheterization 05/28/2010   . Spells 06/16/2008    patient denies   . Stress test 05/28/2010   . 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 Wears glasses      History reviewed with patient    Past Surgical History:   Procedure Laterality Date   . COLONOSCOPY  09/24/2010    COLONOSCOPY performed by Spencer [name] at [facility] OR ENDO   . HX HEART  CATHETERIZATION     . HX LAPAROTOMY     . PB COLONOSCOPY,BIOPSY           Social History     Social History   . Marital status: Married     Spouse name: Spencer [name]   . Number of children: 6   . Years of education: 4     Occupational History   . social worker Spencer [name]     Social History Main Topics   . Smoking status: Never Smoker   . Smokeless tobacco: Never Used   . Alcohol use No      Comment: rarely   . Drug use: No   . Sexual activity: Yes     Partners: Female     Birth control/ protection: None     Other Topics Concern   . Routine Exercise No   . Ability To Walk 2 Flight Of Steps Without Sob/Cp Yes   . Ability To Do Own Adl's Yes     Social History Narrative     Family Medical History     Problem Relation (Age of Onset)    Diabetes Brother    Hypertension Mother, Brother, Brother            Physical Exam:  All nurse's notes reviewed.  ED Triage Vitals   Enc Vitals Group      BP (Non-Invasive) 07/08/17 1031 123/65      Heart Rate 07/08/17 1031 65      Respiratory Rate 07/08/17 1031 18      Temperature 07/08/17 1031 36.2 C (97.2 F)      Temp src --       SpO2-1 07/08/17 1031 97 %      Weight 07/08/17 1031 97.5 kg (215 lb)      Height 07/08/17 1031 1.778 m (5\' 10" )      Head Cir --       Peak Flow --       Pain Score --       Pain Loc --       Pain Edu? --       Excl. in GC? --  Constitutional: NAD. Oriented   HENT:   Head: Normocephalic and atraumatic.   Mouth/Throat: Oropharynx is clear and moist.   Eyes: PERRL, EOMI, conjunctivae without discharge bilaterally  Neck: Trachea midline.   Cardiovascular: RRR, No murmurs, rubs or gallops.   Pulmonary/Chest: BS equal bilaterally, good air movement. No respiratory distress. No wheezes, rales or chest wall tenderness.   Abdominal: Abdomen soft, no rebound or guarding.  Abd tender, per pt this is baseline.            Musculoskeletal: No edema, tenderness or deformity.  Skin: warm and dry. No rash, erythema, pallor or cyanosis  Psychiatric: normal mood  and affect. Behavior is normal.   Neurological: Alert&Ox3. Grossly intact. LEU weakness. Able to flex and extend L elbow.     Labs:  Results for orders placed or performed during the [name] encounter of 07/08/17 (from the past 24 hour(s))   CBC/DIFF    Narrative    The following orders were created for panel order CBC/DIFF.  Procedure                               Abnormality         Status                     ---------                               -----------         ------                     CBC WITH MQKM[[id number]]                                    Final result                 Please view results for these tests on the individual orders.   BASIC METABOLIC PANEL   Result Value Ref Range    SODIUM 139 135 - 145 mmol/L    POTASSIUM 4.4 3.5 - 5.0 mmol/L    CHLORIDE 107 98 - 111 mmol/L    CO2 TOTAL 23 21 - 35 mmol/L    ANION GAP 9 mmol/L    CALCIUM 9.1 8.2 - 10.2 mg/dL    GLUCOSE 579 (H) 70 - 110 mg/dL    BUN 11 10 - 25 mg/dL    CREATININE 0.38 <=3.33 mg/dL    BUN/CREA RATIO 11     ESTIMATED GFR >60 Avg: 93 mL/min/1.58m^2   PT/INR   Result Value Ref Range    INR 1.06 0.90 - 1.10   PTT (PARTIAL THROMBOPLASTIN TIME)   Result Value Ref Range    APTT 32.9 25.0 - 37.0 seconds   THYROID STIMULATING HORMONE (SENSITIVE TSH)   Result Value Ref Range    TSH 1.855 0.450 - 5.330 uIU/mL   THYROXINE, TOTAL T4   Result Value Ref Range    T4 TOTAL 7.1 6.1 - 12.2 ug/dL   TROPONIN-I   Result Value Ref Range    TROPONIN I 0.00 <=0.04 ng/mL   URINALYSIS WITH REFLEX MICROSCOPIC AND CULTURE IF POSITIVE    Narrative    The following orders were created for panel order URINALYSIS WITH REFLEX MICROSCOPIC AND CULTURE IF POSITIVE.  Procedure                               Abnormality         Status                     ---------                               -----------         ------                     URINALYSIS, MACRO/MICRO[[id number]]                                                       Please view results for these tests on the  individual orders.   CBC WITH DIFF   Result Value Ref Range    WBC 7.7 3.3 - 9.3 x10^3/uL    RBC 4.60 4.40 - 5.68 x10^6/uL    HGB 13.8 13.4 - 17.3 g/dL    HCT 16.1 09.6 - 04.5 %    MCV 86.1 82.4 - 95.0 fL    MCH 30.0 27.9 - 33.1 pg    MCHC 34.8 32.8 - 36.0 g/dL    RDW 40.9 81.1 - 91.4 %    PLATELETS 216 140 - 440 x10^3/uL    MPV 9.2 6.0 - 10.2 fL    NEUTROPHIL % 54 %    LYMPHOCYTE % 37 %    MONOCYTE % 7 %    EOSINOPHIL % 2 %    BASOPHIL % 1 %    NEUTROPHIL # 4.10 1.60 - 5.50 x10^3/uL    LYMPHOCYTE # 2.80 0.80 - 3.20 x10^3/uL    MONOCYTE # 0.50 0.20 - 0.80 x10^3/uL    EOSINOPHIL # 0.10 0.00 - 0.50 x10^3/uL    BASOPHIL # 0.10 0.00 - 0.20 x10^3/uL       Imaging:  Results for orders placed or performed during the [name] encounter of 07/08/17 (from the past 72 hour(s))   XR CHEST PA AND LATERAL     Status: None    Narrative    Powell A Mcconathy    PROCEDURE DESCRIPTION: XR CHEST PA AND LATERAL    PROCEDURE PERFORMED DATE AND TIME: 07/08/2017 11:14 AM    CLINICAL INDICATION: Chest pain    TECHNIQUE: 2 views / 2 images submitted.    COMPARISON: 03/16/2007      FINDINGS:     PA and lateral projections of the chest show the cardiac size to be within  normal limits. The mediastinal silhouette is unremarkable. There is no  infiltrate or pleural effusion. There is no evidence for pneumothorax. Bony  structures appear intact.      Impression    No acute process.     CT ANGIO INTRA-EXTRA CRANIAL W/WO CONTRAST     Status: None    Narrative    Quenten A Bendorf    PROCEDURE DESCRIPTION: CT ANGIO INTRA-EXTRA CRANIAL W/WO CONTRAST    PROCEDURE PERFORMED DATE AND TIME: 07/08/2017 12:33 PM    CLINICAL INDICATION: Headache, left-sided weakness    COMPARISON: None      FINDINGS:  There is no evidence for intracranial mass-effect or hematoma. The   ventricular system remains midline and is within normal limits for size,   configuration, and symmetry. There is an old lacunar infarct or prominent  perivascular space at the right basal  ganglia. Review of the bone windows  shows no   displaced fracture and the visualized paranasal sinuses are clear.       Impression    No acute intracranial process on initial unenhanced head CT.  Question old infarct at the right basal ganglia versus prominent  perivascular space.    CTA was performed with intravenous contrast. No mass, lymphadenopathy or  abnormal fluid collection is present. The parotid glands and submandibular  salivary glands maintain a normal symmetric appearance.    The right common carotid artery is patent with no stenosis. The carotid  bifurcation on the right is unremarkable. The right internal and external  carotid arteries are patent with no stenosis. The left common carotid  artery is patent with no stenosis. The carotid bifurcation on the left is  unremarkable. The left internal and external carotid arteries are patent  with no stenosis. Both vertebral arteries are patent.    Intracranially, the internal carotid arteries are unremarkable. The  anterior cerebral and middle cerebral arteries are patent bilaterally.  There is no thrombus, aneurysm, or stenosis. The vertebrobasilar system is  patent. Both posterior cerebral arteries are patent and originate from the  tip of the basilar artery.    IMPRESSION: No abnormality.           Orders Placed This Encounter   . CANCELED: CT ANGIO INTRACRANIAL W/WO IV CONTRAST   . CANCELED: CT ANGIO CAROTID-EXTRACRANIAL (NECK) W IV CONTRAST   . XR CHEST PA AND LATERAL   . CT ANGIO INTRA-EXTRA CRANIAL W/WO CONTRAST   . CBC/DIFF   . BASIC METABOLIC PANEL   . PT/INR   . PTT (PARTIAL THROMBOPLASTIN TIME)   . THYROID STIMULATING HORMONE (SENSITIVE TSH)   . THYROXINE, TOTAL T4   . TROPONIN-I   . URINALYSIS WITH REFLEX MICROSCOPIC AND CULTURE IF POSITIVE   . CBC WITH DIFF   . URINALYSIS, MACRO/MICRO   . CANCELED: POTASSIUM   . CANCELED: POTASSIUM   . ECG 12 LEAD - ED USE   . NS bolus infusion 1,000 mL   . morphine 4 mg/mL injection   . ioversol (OPTIRAY 350)  infusion   . aspirin chewable tablet 324 mg       Abnormal Lab results:  Labs Reviewed   BASIC METABOLIC PANEL - Abnormal; Notable for the following:        Result Value    GLUCOSE 115 (*)     All other components within normal limits   PT/INR - Normal   PTT (PARTIAL THROMBOPLASTIN TIME) - Normal   THYROID STIMULATING HORMONE (SENSITIVE TSH) - Normal   THYROXINE, TOTAL T4 - Normal   TROPONIN-I - Normal   CBC/DIFF    Narrative:     The following orders were created for panel order CBC/DIFF.  Procedure                               Abnormality         Status                     ---------                               -----------         ------  CBC WITH ZOXW[[id number]]                                    Final result                 Please view results for these tests on the individual orders.   CBC WITH DIFF   URINALYSIS WITH REFLEX MICROSCOPIC AND CULTURE IF POSITIVE    Narrative:     The following orders were created for panel order URINALYSIS WITH REFLEX MICROSCOPIC AND CULTURE IF POSITIVE.  Procedure                               Abnormality         Status                     ---------                               -----------         ------                     URINALYSIS, MACRO/MICRO[[id number]]                                                       Please view results for these tests on the individual orders.   URINALYSIS, MACRO/MICRO       EKG: Normal sinus. No ischemic changes concerning for MI, No arrhythmia.     Plan: Appropriate labs and imaging ordered. Medical Records reviewed.    Therapy/Procedures/Course/MDM:   Patient presented emergency department with chest pain paresthesias in the left upper extremity.  The patient does have headache on time of arrival concern for carotid artery dissection, CVA, subarachnoid, ACS or other emergent etiology.  CTA intra and extracranial negative for acute intracranial pathology.   EKG without ischemic changes.  Troponin negative patient remained  stable over emergency department treatment course.  Dr. Maisie Fus of Internal Medicine was consulted and agreed to admit the patient for continued evaluation management. Results were discussed with the patient and any present family members at bedside.  The patient expressed good understanding and given an opportunity to ask questions.    Consults:  Dr. Maisie Fus (hospitalist)      Diagnosis / Impression:     ICD-10-CM    1. Chest pain, unspecified type R07.9    2. Paresthesias/numbness R20.9          Disposition:   Admit Dr. Maisie Fus    I am scribing for, and in the presence of, Dr. Talbert Nan for services provided on 07/08/2017.  Tori Nesmith, SCRIBE      I personally performed the services described in this documentation, as scribed  in my presence, and it is both accurate  and complete.    Lynnda Child, MD    Lynnda Child, M.D. 07/08/2017  PGY-3 - Department of Emergency Medicine   [facility]

## 2017-07-08 NOTE — Care Plan (Signed)
Problem: Cardiac: ACS (Acute Coronary Syndrome) (Adult)  Prevent and manage potential problems including:  1. cardiovascular structural defects  2. chest pain (angina)  3. dysrhythmia/arrhythmia  4. embolism  5. heart failure/shock  6. ischemia leading to infarction  7. pericarditis  8. situational response  Goal: Signs and Symptoms of Listed Potential Problems Will be Absent, Minimized or Managed (Cardiac: ACS)  Signs and symptoms of listed potential problems will be absent, minimized or managed by discharge/transition of care (reference Cardiac: ACS (Acute Coronary Syndrome) (Adult) CPG).  Outcome: Ongoing (see interventions/notes)

## 2017-07-09 ENCOUNTER — Observation Stay (HOSPITAL_BASED_OUTPATIENT_CLINIC_OR_DEPARTMENT_OTHER)

## 2017-07-09 DIAGNOSIS — R079 Chest pain, unspecified: Secondary | ICD-10-CM

## 2017-07-09 DIAGNOSIS — N400 Enlarged prostate without lower urinary tract symptoms: Secondary | ICD-10-CM

## 2017-07-09 DIAGNOSIS — K5090 Crohn's disease, unspecified, without complications: Secondary | ICD-10-CM

## 2017-07-09 DIAGNOSIS — Z8673 Personal history of transient ischemic attack (TIA), and cerebral infarction without residual deficits: Secondary | ICD-10-CM

## 2017-07-09 DIAGNOSIS — I2510 Atherosclerotic heart disease of native coronary artery without angina pectoris: Secondary | ICD-10-CM

## 2017-07-09 DIAGNOSIS — R7303 Prediabetes: Secondary | ICD-10-CM

## 2017-07-09 DIAGNOSIS — R202 Paresthesia of skin: Secondary | ICD-10-CM

## 2017-07-09 DIAGNOSIS — M542 Cervicalgia: Secondary | ICD-10-CM

## 2017-07-09 DIAGNOSIS — K219 Gastro-esophageal reflux disease without esophagitis: Secondary | ICD-10-CM

## 2017-07-09 DIAGNOSIS — Z8249 Family history of ischemic heart disease and other diseases of the circulatory system: Secondary | ICD-10-CM

## 2017-07-09 DIAGNOSIS — Z7982 Long term (current) use of aspirin: Secondary | ICD-10-CM

## 2017-07-09 DIAGNOSIS — I739 Peripheral vascular disease, unspecified: Secondary | ICD-10-CM

## 2017-07-09 LAB — BASIC METABOLIC PANEL
ANION GAP: 6 mmol/L
BUN/CREA RATIO: 10
BUN: 9 mg/dL — ABNORMAL LOW (ref 10–25)
CALCIUM: 8.7 mg/dL (ref 8.2–10.2)
CHLORIDE: 107 mmol/L (ref 98–111)
CO2 TOTAL: 25 mmol/L (ref 21–35)
CREATININE: 0.94 mg/dL (ref <=1.30)
ESTIMATED GFR: >60 mL/min/{1.73_m2}
GLUCOSE: 119 mg/dL — ABNORMAL HIGH (ref 70–110)
POTASSIUM: 4.2 mmol/L (ref 3.5–5.0)
SODIUM: 138 mmol/L (ref 135–145)

## 2017-07-09 LAB — ECG 12 LEAD - ED USE
Calculated P Axis: 32 deg
Calculated T Axis: 32 deg
EKG Severity: NORMAL
EKG Severity: NORMAL
Heart Rate: 60 {beats}/min
I 40 Axis: -8 deg
PR Interval: 196 ms
QRS Axis: 6 deg
QRS Duration: 85 ms
QT Interval: 403 ms
QTC Calculation: 403 ms
ST Axis: 20 deg
T 40 Axis: 24 deg

## 2017-07-09 LAB — TRANSTHORACIC ECHOCARDIOGRAM - ADULT
Interventricular Septum Diastolic Thickness by 2D: 0.7 cm
LVIDD - 2D: 5.2 cm
LVIDS 2D: 3.2 cm
LVPWD: 0.7 cm
TR VELOCITY: 208

## 2017-07-09 LAB — TROPONIN-I
TROPONIN I: 0 ng/mL (ref <=0.04)
TROPONIN I: 0 ng/mL (ref <=0.04)

## 2017-07-09 LAB — CBC WITH DIFF
BASOPHIL #: 0 10*3/uL (ref 0.00–0.20)
BASOPHIL %: 0 %
EOSINOPHIL #: 0.2 10*3/uL (ref 0.00–0.50)
EOSINOPHIL %: 3 %
HCT: 37.7 % — ABNORMAL LOW (ref 38.9–50.5)
HGB: 13.3 g/dL — ABNORMAL LOW (ref 13.4–17.3)
LYMPHOCYTE #: 2.6 10*3/uL (ref 0.80–3.20)
LYMPHOCYTE %: 42 %
MCH: 30.1 pg (ref 27.9–33.1)
MCHC: 35.3 g/dL (ref 32.8–36.0)
MCV: 85.2 fL (ref 82.4–95.0)
MONOCYTE #: 0.5 10*3/uL (ref 0.20–0.80)
MONOCYTE %: 9 %
MPV: 9 fL (ref 6.0–10.2)
NEUTROPHIL #: 2.9 10*3/uL (ref 1.60–5.50)
NEUTROPHIL %: 47 %
PLATELETS: 178 10*3/uL (ref 140–440)
RBC: 4.43 10*6/uL (ref 4.40–5.68)
RDW: 13.8 % (ref 10.9–15.1)
WBC: 6.2 10*3/uL (ref 3.3–9.3)

## 2017-07-09 LAB — PHOSPHORUS: PHOSPHORUS: 3.5 mg/dL (ref 2.5–4.5)

## 2017-07-09 LAB — MAGNESIUM: MAGNESIUM: 2 mg/dL (ref 1.8–2.3)

## 2017-07-09 MED ORDER — MORPHINE 2 MG/ML INTRAVENOUS CARTRIDGE
1.00 mg | CARTRIDGE | INTRAVENOUS | Status: DC | PRN
Start: 2017-07-09 — End: 2017-07-09
  Administered 2017-07-09: 1 mg via INTRAVENOUS
  Filled 2017-07-09: qty 1

## 2017-07-09 MED ORDER — KETOROLAC 30 MG/ML (1 ML) INJECTION SOLUTION
30.0000 mg | Freq: Once | INTRAMUSCULAR | Status: AC
Start: 2017-07-09 — End: 2017-07-09
  Administered 2017-07-09: 30 mg via INTRAVENOUS
  Filled 2017-07-09: qty 1

## 2017-07-09 MED ORDER — CIPROFLOXACIN 0.3 %-DEXAMETHASONE 0.1 % EAR DROPS,SUSPENSION
4.00 [drp] | Freq: Two times a day (BID) | OTIC | 0 refills | Status: AC
Start: 2017-07-09 — End: 2017-07-15

## 2017-07-09 MED ADMIN — lidocaine 5 % topical ointment: INTRAVENOUS | @ 01:00:00 | NDC 51672302009

## 2017-07-09 NOTE — Care Plan (Signed)
Problem: Patient Care Overview (Adult,OB)  Goal: Plan of Care Review(Adult,OB)  The patient and/or their representative will communicate an understanding of their plan of care   Outcome: Adequate for Discharge Date Met: 07/09/17    Goal: Individualization/Patient Specific Goal(Adult/OB)  Outcome: Adequate for Discharge Date Met: 07/09/17    Goal: Interdisciplinary Rounds/Family Conf  Outcome: Adequate for Discharge Date Met: 07/09/17

## 2017-07-09 NOTE — Discharge Summary (Signed)
HOSPITALIST   DISCHARGE SUMMARY      PATIENT NAME:  Spencer [name],Spencer [name]  MRN:  [id number]  DOB:  September 04, 1966    ADMISSION DATE:  07/08/2017  DISCHARGE DATE:  07/09/2017    ATTENDING PHYSICIAN: Armen Pickup*  SERVICE: [facility]  PRIMARY CARE PHYSICIAN: [name], DO     Reason for Admission     Diagnosis    Chest pain [125822]    Chest pain [125822]          DISCHARGE DIAGNOSIS:     Principal Problem:  Chest pain    Active Hospital Problems    Diagnosis Date Noted   . Principle Problem: Chest pain 07/08/2017      Resolved Hospital Problems    Diagnosis    No resolved problems to display.     Active Non-Hospital Problems    Diagnosis Date Noted   . Low back pain radiating to left leg 05/03/2017   . Left leg weakness 05/03/2017   . Headache 09/15/2015   . Unconjugated hyperbilirubinemia 11/11/2011   . 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 BPH (benign prostatic hyperplasia) 07/15/2011   . Abdominal pain 07/15/2011   . Microhematuria 12/25/2010   . Crohn's disease (CMS HCC) 06/14/2010   . IBS (irritable bowel syndrome) 06/14/2010   . GERD (gastroesophageal reflux disease) 06/14/2010   . PAD (peripheral artery disease) (CMS HCC) 06/03/2010   . Hypertriglyceridemia 06/03/2010   . Anginal pain (CMS HCC) 06/03/2010   . CAD (coronary artery disease) 06/03/2010   . Stress test 05/28/2010   . S/P cardiac catheterization 05/28/2010   . Spells 06/16/2008   . Hyperlipidemia LDL goal <70 12/26/2007   . Borderline diabetes mellitus 12/23/2007      Allergies   Allergen Reactions   . Bee Sting [Hymenoptera Allergenic Extract] Anaphylaxis and Shortness of Breath   . Adhesive      Red rash   . Wheat Swelling        DISCHARGE MEDICATIONS:     Current Discharge Medication List      START taking these medications.       Details    ciprofloxacin-dexamethasone 0.3-0.1 % Drops, Suspension   Commonly known as:  CIPRODEX    4 Drops, Both Ears, 2x/day   Qty:  7.5 mL   Refills:  0         CONTINUE these medications - NO  CHANGES were made during your visit.       Details    aspirin 81 mg Tablet, Delayed Release (E.C.)   Commonly known as:  ECOTRIN    81 mg, Oral, Daily   Refills:  0       gabapentin 100 mg Capsule   Commonly known as:  NEURONTIN    300 mg, Oral, NIGHTLY   Refills:  0       Ibuprofen 800 mg Tablet   Commonly known as:  MOTRIN    800 mg, Oral, 3x/day PRN   Refills:  0       ketoconazole 2 % Cream   Commonly known as:  NIZORAL    Topical, 2x/day   Qty:  15 g   Refills:  0       melatonin 10 mg Capsule    1 Cap, Oral, HS PRN   Refills:  0       nitroGLYCERIN 0.4 mg Tablet, Sublingual   Commonly known as:  NITROSTAT    for 3 doses over 15 minutes  Qty:  25 Tab   Refills:  3       pantoprazole 20 mg Tablet, Delayed Release (E.C.)   Commonly known as:  PROTONIX    20 mg, Oral, Daily before Breakfast   Refills:  0       sumatriptan succinate 100 mg Tablet   Commonly known as:  IMITREX    100 mg, Oral, Q12H PRN, May repeat in 2 hours in needed    Refills:  0       traZODone 100 mg Tablet   Commonly known as:  DESYREL    100 mg, Oral, NIGHTLY   Refills:  0                 DISCHARGEINSTRUCTIONS:  Follow-up Information     Follow up with [name], DO. Schedule an appointment as soon as possible for [name] visit in 1 week.    Specialty:  FAMILY PRACTICE    Why:  Hospital follow-up    Contact information:    [address]  [address]  [place] [address]  [phone]          Follow up with Tingler, Lodema Hong, MD. Schedule an appointment as soon as possible for [name] visit in 1 week.    Specialties:  CARDIOLOGY, INTERNAL MEDICINE    Why:  Hospital follow-up    Contact information:    [address]  [place] [address]  [phone]            DISCHARGE INSTRUCTION - CARDIAC DIET   Diet: CARDIAC DIET      DISCHARGE INSTRUCTION - ACTIVITY   Activity: AS TOLERATED             REASON FOR HOSPITALIZATION AND HOSPITAL COURSE:  This is [name] 51 y.o., male who presents to [facility] for further evaluation of left  shoulder/neck pain. Patient states overnight he had some pain that started in his left scalp with radiation down to his left neck to his left arm into his left chest with associated numbness.  He does have history of headaches and you takes Imitrex for this.  His typical headache is [name] bifrontal headache with some radiation to his occiput.  He has [name] history of some anginal pain in the past for which he has had MPS and catheterization which demonstrated myocardial bridging in 2009/2010.  He has previously followed with Dr. Myer Peer for his cardiology.      Patient was admitted to medical observation bed.  Serial troponins were obtained and unremarkable.  MRI of the brain was obtained demonstrated no evidence for acute intracranial process, suspect incidental prominent prevascular space right subinsular white matter.  Patient's chest pain did resolve.  Patient denies any acute complaints on day of discharge. He did reveal the little pressure the back was.  One dose of Toradol was given which did relieve his pressure.  Patient was discharged home in stable condition with outpatient follow up with Dr. Myer Peer.     PHYSICAL EXAM:   Nursing note and vitals reviewed.   Constitutional: Patient is in no acute distress.    Lungs: Clear to auscultation bilaterally without wheezes, rales or rhonchi.  Cardiovascular: Regular rate and rhythm. No murmur, rub, or gallop.   Abdomen: Normal bowel sounds. Soft,nontender, nondistended.   MSK/Extremities: Moves all extremities. Pulses equal bilaterally.  Skin: No lesions noted.  No rashes.  Neuro: Alert and oriented X 3.  CNs 2-12 grossly intact. No facial droop  noted.          LABS:  Results for orders placed or performed during the hospital encounter of 07/08/17 (from the past 24 hour(s))   TROPONIN-I   Result Value Ref Range    TROPONIN I 0.00 <=0.04 ng/mL   TROPONIN-I   Result Value Ref Range    TROPONIN I 0.00 <=0.04 ng/mL   CBC/DIFF    Narrative    The following orders were created  for panel order CBC/DIFF.  Procedure                               Abnormality         Status                     ---------                               -----------         ------                     CBC WITH DIFF[[id number]]                Abnormal            Final result                 Please view results for these tests on the individual orders.   BASIC METABOLIC PANEL, NON-FASTING   Result Value Ref Range    SODIUM 138 135 - 145 mmol/L    POTASSIUM 4.2 3.5 - 5.0 mmol/L    CHLORIDE 107 98 - 111 mmol/L    CO2 TOTAL 25 21 - 35 mmol/L    ANION GAP 6 mmol/L    CALCIUM 8.7 8.2 - 10.2 mg/dL    GLUCOSE 161 (H) 70 - 110 mg/dL    BUN 9 (L) 10 - 25 mg/dL    CREATININE 0.96 <=0.45 mg/dL    BUN/CREA RATIO 10     ESTIMATED GFR >60 Avg: 93 mL/min/1.23m^2   MAGNESIUM   Result Value Ref Range    MAGNESIUM 2.0 1.8 - 2.3 mg/dL   PHOSPHORUS   Result Value Ref Range    PHOSPHORUS 3.5 2.5 - 4.5 mg/dL   TROPONIN-I   Result Value Ref Range    TROPONIN I 0.00 <=0.04 ng/mL   CBC WITH DIFF   Result Value Ref Range    WBC 6.2 3.3 - 9.3 x10^3/uL    RBC 4.43 4.40 - 5.68 x10^6/uL    HGB 13.3 (L) 13.4 - 17.3 g/dL    HCT 40.9 (L) 81.1 - 50.5 %    MCV 85.2 82.4 - 95.0 fL    MCH 30.1 27.9 - 33.1 pg    MCHC 35.3 32.8 - 36.0 g/dL    RDW 91.4 78.2 - 95.6 %    PLATELETS 178 140 - 440 x10^3/uL    MPV 9.0 6.0 - 10.2 fL    NEUTROPHIL % 47 %    LYMPHOCYTE % 42 %    MONOCYTE % 9 %    EOSINOPHIL % 3 %    BASOPHIL % 0 %    NEUTROPHIL # 2.90 1.60 - 5.50 x10^3/uL    LYMPHOCYTE # 2.60 0.80 - 3.20 x10^3/uL    MONOCYTE # 0.50 0.20 - 0.80 x10^3/uL    EOSINOPHIL # 0.20 0.00 -  0.50 x10^3/uL    BASOPHIL # 0.00 0.00 - 0.20 x10^3/uL         RADIOLOGY:  MRI BRAIN W/WO CONTRAST   Final Result   1. No evidence for acute intracranial process.   2. Suspect incidental prominent perivascular space right subinsular white   matter as described.         CT ANGIO INTRA-EXTRA CRANIAL W/WO CONTRAST   Final Result   No acute intracranial process on initial unenhanced head CT.    Question old infarct at the right basal ganglia versus prominent   perivascular space.      CTA was performed with intravenous contrast. No mass, lymphadenopathy or   abnormal fluid collection is present. The parotid glands and submandibular   salivary glands maintain [name] normal symmetric appearance.      The right common carotid artery is patent with no stenosis. The carotid   bifurcation on the right is unremarkable. The right internal and external   carotid arteries are patent with no stenosis. The left common carotid   artery is patent with no stenosis. The carotid bifurcation on the left is   unremarkable. The left internal and external carotid arteries are patent   with no stenosis. Both vertebral arteries are patent.      Intracranially, the internal carotid arteries are unremarkable. The   anterior cerebral and middle cerebral arteries are patent bilaterally.   There is no thrombus, aneurysm, or stenosis. The vertebrobasilar system is   patent. Both posterior cerebral arteries are patent and originate from the   tip of the basilar artery.      IMPRESSION: No abnormality.         XR CHEST PA AND LATERAL   Final Result   No acute process.                 CONDITION ON DISCHARGE:Improved and stable.      DISCHARGE DISPOSITION:  Home discharge              Rosalita Levan, FNP  07/09/2017, 11:30    Copies sent to Care Team       Relationship Specialty Notifications Start End    [name], DO PCP - General   06/23/08     Phone: [phone] Fax: [phone]         [address] [place] [address]          Referring providers can utilize [email or web address] to access their referred [employer] patient's information.

## 2017-07-09 NOTE — Care Plan (Signed)
Problem: Patient Care Overview (Adult,OB)  Goal: Plan of Care Review(Adult,OB)  The patient and/or their representative will communicate an understanding of their plan of care   Outcome: Ongoing (see interventions/notes)  Goal: Individualization/Patient Specific Goal(Adult/OB)  Outcome: Ongoing (see interventions/notes)  Goal: Interdisciplinary Rounds/Family Conf  Outcome: Ongoing (see interventions/notes)

## 2017-07-09 NOTE — Care Plan (Signed)
VSS.BP 125/69  Pulse 61  Temp 36.5 C (97.7 F)  Resp 18  Ht 1.778 m (5\' 10" )  Wt 97.5 kg (215 lb)  SpO2 94%  BMI 30.85 kg/m2 Denies chestpain at this time. Continue meds and treatments as ordered. No acute distress noted. Continue to observe.

## 2017-08-08 ENCOUNTER — Ambulatory Visit: Attending: PHYSICAL MEDICINE AND REHABILITATION | Admitting: PHYSICAL MEDICINE AND REHABILITATION

## 2017-08-08 DIAGNOSIS — K5090 Crohn's disease, unspecified, without complications: Secondary | ICD-10-CM | POA: Insufficient documentation

## 2017-08-08 DIAGNOSIS — M5136 Other intervertebral disc degeneration, lumbar region: Principal | ICD-10-CM | POA: Insufficient documentation

## 2017-08-08 NOTE — Addendum Note (Signed)
Addended byBurnadette Peter, Michel Eskelson on: 08/08/2017 12:42 PM     Modules accepted: Orders, SmartSet

## 2017-08-08 NOTE — Progress Notes (Signed)
To be dictated  Santa Genera, MD 08/08/2017, 12:42

## 2017-08-08 NOTE — Progress Notes (Signed)
To be dictated  Santa Genera, MD 08/08/2017, 12:32

## 2017-08-09 ENCOUNTER — Ambulatory Visit (INDEPENDENT_AMBULATORY_CARE_PROVIDER_SITE_OTHER): Payer: Self-pay | Admitting: Family

## 2017-08-09 ENCOUNTER — Other Ambulatory Visit (INDEPENDENT_AMBULATORY_CARE_PROVIDER_SITE_OTHER): Payer: Self-pay | Admitting: Family

## 2017-08-09 DIAGNOSIS — M47816 Spondylosis without myelopathy or radiculopathy, lumbar region: Secondary | ICD-10-CM

## 2017-08-09 DIAGNOSIS — M5137 Other intervertebral disc degeneration, lumbosacral region: Secondary | ICD-10-CM

## 2017-08-09 DIAGNOSIS — M545 Low back pain, unspecified: Secondary | ICD-10-CM

## 2017-08-09 DIAGNOSIS — G8929 Other chronic pain: Secondary | ICD-10-CM

## 2017-08-09 NOTE — Progress Notes (Signed)
PATIENT NAME: Spencer [name], Spencer [name]  HOSPITAL NUMBER:  [id number]  DATE OF SERVICE: 08/08/2017  DATE OF BIRTH:  01/21/1966    CLINIC NOTE    SUBJECTIVE:  The patient relates doing about the same.  Still that daily low back pain.  He relates he did get in to see the pain clinic and they are considering doing some injections in the back.  He relates he unfortunately had some issues that required some hospital stays for his Crohn's and he has also had some heart issues.  He had did get a lumbar spine MRI that was done June 05, 2017, and showed some mild degenerative changes, broad-based disk bulge and some mild facet changes.  There was noted to be annular fissure in the left foramina at L5-S1 and some mild facet changes.  He relates that he would like a letter stating that he needs some accommodations at work.  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  He would like some modifications with his desk and chair to help take some of the stress off his back and like to be able to change positions to avoid any prolonged sitting or standing.    OBJECTIVE:  He is alert and oriented.  Lungs are clear.  Heart is regular rate and rhythm.  Neurologically nonfocal.    ASSESSMENT:  Lumbar degenerative disk disease.    PLAN:  We will get him in to see the pain clinic for some treatments.  I will see him back in about 6 months and will give him a list of accommodations for his work at the [employer].        Santa Genera, MD  Assistant Professor   [facility] Orthopa[facility]              DD:  08/08/2017 12:46:31  DT:  08/09/2017 12:17:31 CB  D#:  [id number]

## 2017-08-09 NOTE — Telephone Encounter (Signed)
I returned Spencer [name] phone call. He would like to proceed w/diagnostic L4/5/S1 LMBB. Order placed.  Charlette Caffey, APRN,NP-C, 08/09/2017, 08:21

## 2017-08-09 NOTE — Telephone Encounter (Signed)
-----   Message from Maryagnes Amos, LPN sent at 4/46/1901  9:33 AM EDT -----  ----- Message from Holly Bodily sent at 08/08/2017  7:44 AM EDT -----  Spencer [name] pt     The patient called asking if Britanny Marksberry would call him to discuss getting injections.  Please advise the patient at [phone].    Thank you

## 2017-08-10 ENCOUNTER — Other Ambulatory Visit (HOSPITAL_COMMUNITY): Payer: Self-pay

## 2017-08-10 ENCOUNTER — Encounter (INDEPENDENT_AMBULATORY_CARE_PROVIDER_SITE_OTHER): Admitting: Urology

## 2017-08-18 ENCOUNTER — Other Ambulatory Visit (INDEPENDENT_AMBULATORY_CARE_PROVIDER_SITE_OTHER): Payer: Self-pay | Admitting: Neurological Surgery

## 2017-08-20 ENCOUNTER — Encounter (INDEPENDENT_AMBULATORY_CARE_PROVIDER_SITE_OTHER): Payer: Self-pay | Admitting: Internal Medicine

## 2017-08-20 ENCOUNTER — Telehealth (INDEPENDENT_AMBULATORY_CARE_PROVIDER_SITE_OTHER): Payer: Self-pay | Admitting: Internal Medicine

## 2017-08-21 NOTE — Letter (Signed)
PATIENT NAME: MATTEO, LOOKER  HOSPITAL NUMBER:  [id number]  DATE OF SERVICE: 08/20/2017  DATE OF BIRTH:  18-Feb-1966      August 20, 2017       To Whom It May Concern:     Mr. Facchini is a 51 year old male who follows with me in the cardiology clinic. The patient was first evaluated by me in the office back on June 03, 2010. He has followed up intermittently with me over the years. His most recent visit was on March 02, 2017. The patient has a history of mild atherosclerotic heart disease and cardiac risk factors. His cardiac status has been stable over the past couple of years. He recently went to [facility] with chest pain. They suggested a stress test. The patient wanted our opinion with respect to stress testing. The patient's last available stress test results per our notes was back on October 09, 2009.     I do feel the patient would benefit from cardiac stress testing to further evaluate his chest pain. If there are additional questions or concerns, I would be happy to address those. I sincerely hope that this information helps in the care of Mr. Salinger.     Sincerely,        Salley Scarlet, MD  Assistant Professor, Section of Cardiology   [facility]            CC:   Draxton Parrow   [address]   [place], [place] [address]       DD:  08/20/2017 16:35:24  DT:  08/21/2017 07:29:54 CB  D#:  [id number]

## 2017-08-22 ENCOUNTER — Other Ambulatory Visit (INDEPENDENT_AMBULATORY_CARE_PROVIDER_SITE_OTHER): Payer: Self-pay | Admitting: Neurological Surgery

## 2017-10-16 DIAGNOSIS — Z9289 Personal history of other medical treatment: Secondary | ICD-10-CM | POA: Insufficient documentation

## 2017-10-24 ENCOUNTER — Ambulatory Visit (INDEPENDENT_AMBULATORY_CARE_PROVIDER_SITE_OTHER): Admitting: Internal Medicine

## 2017-10-24 DIAGNOSIS — I2510 Atherosclerotic heart disease of native coronary artery without angina pectoris: Secondary | ICD-10-CM

## 2017-10-24 DIAGNOSIS — R7303 Prediabetes: Secondary | ICD-10-CM

## 2017-10-24 DIAGNOSIS — I209 Angina pectoris, unspecified: Secondary | ICD-10-CM

## 2017-10-24 DIAGNOSIS — E781 Pure hyperglyceridemia: Secondary | ICD-10-CM

## 2017-10-24 DIAGNOSIS — E785 Hyperlipidemia, unspecified: Secondary | ICD-10-CM

## 2017-10-24 DIAGNOSIS — Z029 Encounter for administrative examinations, unspecified: Secondary | ICD-10-CM

## 2017-10-26 ENCOUNTER — Ambulatory Visit (INDEPENDENT_AMBULATORY_CARE_PROVIDER_SITE_OTHER): Payer: Self-pay | Admitting: Family

## 2017-10-26 NOTE — Telephone Encounter (Signed)
Last dos 09/25/16  Last labs 2016

## 2017-10-26 NOTE — Telephone Encounter (Signed)
Should seek care at urgent care in the area. It has been over a year since he has been here.

## 2017-10-26 NOTE — Telephone Encounter (Signed)
-----   Message from Joesph Fillers sent at 10/26/2017  5:27 PM EST -----  PT CALLED STATING THAT HE IS A REGULAR PATIENT OF DR VASICEK AND HE IS NOW OUT OF TOWN FOR THE NEXT 2-3 WEEKS FOR TRAINING. PT STATES HE HAS CAUGHT SOME TYPE OF FLU AND WOULD LIKE MEDICINE CALLED INTO THE [facility] BY HIS HOTEL. PT STATES HE IS IN [place], [place]. PT CALL BACK NUMBER [phone]

## 2017-10-30 NOTE — Progress Notes (Signed)
Date: 10/24/2017    Patient Name: Spencer [name]    Date of birth: 03/19/1966  Primary Care Provider: [name], DO     Patient was a NO SHOW for his appointment today. The patient has a history of mild CAD, myocardial bridging, angina, Crohn's disease, HLD, borderline DM, migraine headaches.    Diagnoses reviewed prior to appointment    ICD-10-CM    1. CAD (coronary artery disease) I25.10    2. Anginal pain (CMS HCC) I20.9    3. Borderline diabetes mellitus R73.03    4. Hyperlipidemia LDL goal <70 E78.5    5. Hypertriglyceridemia E78.1         Medications have been "Reviewed" as required to close this electronic record. This does not indicate that the medications were actually reviewed or verified by me.     Salley Scarlet, MD  10/30/2017, [phone]

## 2018-02-06 ENCOUNTER — Encounter (INDEPENDENT_AMBULATORY_CARE_PROVIDER_SITE_OTHER): Admitting: PHYSICAL MEDICINE AND REHABILITATION

## 2018-04-25 ENCOUNTER — Encounter (INDEPENDENT_AMBULATORY_CARE_PROVIDER_SITE_OTHER): Admitting: PHYSICAL MEDICINE AND REHABILITATION

## 2018-11-23 ENCOUNTER — Ambulatory Visit: Attending: Urology | Admitting: Urology

## 2018-11-23 ENCOUNTER — Encounter (INDEPENDENT_AMBULATORY_CARE_PROVIDER_SITE_OTHER): Payer: Self-pay | Admitting: Urology

## 2018-11-23 ENCOUNTER — Ambulatory Visit (INDEPENDENT_AMBULATORY_CARE_PROVIDER_SITE_OTHER): Admitting: Rheumatology

## 2018-11-23 VITALS — BP 130/70 | HR 82 | Temp 97.9°F | Ht 70.0 in | Wt 222.7 lb

## 2018-11-23 DIAGNOSIS — Z79899 Other long term (current) drug therapy: Secondary | ICD-10-CM | POA: Insufficient documentation

## 2018-11-23 DIAGNOSIS — N50812 Left testicular pain: Secondary | ICD-10-CM | POA: Insufficient documentation

## 2018-11-23 DIAGNOSIS — N50819 Testicular pain, unspecified: Secondary | ICD-10-CM

## 2018-11-23 DIAGNOSIS — N400 Enlarged prostate without lower urinary tract symptoms: Secondary | ICD-10-CM

## 2018-11-23 DIAGNOSIS — Z6831 Body mass index (BMI) 31.0-31.9, adult: Secondary | ICD-10-CM

## 2018-11-23 DIAGNOSIS — Z125 Encounter for screening for malignant neoplasm of prostate: Secondary | ICD-10-CM

## 2018-11-23 DIAGNOSIS — Z87448 Personal history of other diseases of urinary system: Secondary | ICD-10-CM

## 2018-11-23 DIAGNOSIS — N50811 Right testicular pain: Secondary | ICD-10-CM | POA: Insufficient documentation

## 2018-11-23 LAB — PSA SCREENING: PSA: 0.4 ng/mL (ref <=4.0)

## 2018-11-23 NOTE — Progress Notes (Addendum)
CC: Testicle pain      S: See previous GU notes.  52 y.o. AA gentleman here today for return visit. He is currently living in [place], [place]. (here for the holidays). C/O episode of bilateral testicle pain which occurred in October, 2019. "Hard, stabbing pain" then became "crampy pain". 10/10 on pain scale; mild nausea, no diaphoresis. "Then it felt like a bruise about 2 weeks later". He was evaluated by a urologist in [place], [place]; scrotal US showed a subclinical hydrocele. He was started on Flomax which he stated "did not help". No voiding complaints. He underwent a negative microhematuria workup here on 03-14-17.  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                     Patient Active Problem List   Diagnosis   . Borderline diabetes mellitus   . Hyperlipidemia LDL goal <70   . Spells   . Stress test   . S/P cardiac catheterization   . PAD (peripheral artery disease) (CMS HCC)   . Hypertriglyceridemia   . Anginal pain (CMS HCC)   . CAD (coronary artery disease)   . Crohn's disease (CMS HCC)   . IBS (irritable bowel syndrome)   . GERD (gastroesophageal reflux disease)   . Microhematuria   . BPH (benign prostatic hyperplasia)   . Abdominal pain   . Myocardial Bridging   . Unconjugated hyperbilirubinemia   . Headache   . Low back pain radiating to left leg   . Left leg weakness   . 2-D echo       Past Surgical History:   Procedure Laterality Date   . COLONOSCOPY  09/24/2010    COLONOSCOPY performed by [name] at [facility] OR ENDO   . HX HEART CATHETERIZATION     . HX LAPAROTOMY     . PB COLONOSCOPY,BIOPSY         Current Outpatient Medications   Medication Sig   . aspirin (ECOTRIN) 81 mg Oral Tablet, Delayed Release (E.C.) Take 81 mg by mouth Once a day   . gabapentin (NEURONTIN) 100 mg Oral Capsule Take 300 mg by mouth Every night    . Ibuprofen (MOTRIN) 800 mg Oral Tablet Take 800 mg by mouth Three times a day as needed for Pain   . ketoconazole  (NIZORAL) 2 % Cream Apply topically Twice daily   . melatonin 10 mg Oral Capsule Take 1 Cap by mouth Every night as needed   . nitroglycerin (NITROSTAT) 0.4 mg Sublingual Tablet, Sublingual for 3 doses over 15 minutes   . pantoprazole (PROTONIX) 20 mg Oral Tablet, Delayed Release (E.C.) Take 20 mg by mouth Every morning before breakfast   . sumatriptan succinate (IMITREX) 100 mg Oral Tablet Take 100 mg by mouth Every 12 hours as needed for Migraine May repeat in 2 hours in needed   . tamsulosin (FLOMAX) 0.4 mg Oral Capsule Take 0.4 mg by mouth Every evening after dinner   . traZODone (DESYREL) 100 mg Oral Tablet Take 100 mg by mouth Every night           Allergies   Allergen Reactions   . Bee Sting [Hymenoptera Allergenic Extract] Anaphylaxis and Shortness of Breath   . Adhesive      Red rash   . Wheat Swelling       ROS: GU-see subjective  All other systems negative.    O:  BP 130/70   Pulse 82   Temp 36.6 C (97.9 F) (Thermal Scan)   Ht 1.778 m (5\' 10" )   Wt 101 kg (222 lb 10.6 oz)   SpO2 97% Comment: room air  BMI 31.95 kg/m                                   Constitutional- Obese gentleman in NAD, no weight loss, no fevers                         Skin- No rashes, no ulcerations noted                         Eyes- Grossly intact, eyes clear                         Ears, nose, mouth, throat- no nasal discharge, no ear discharge, tongue normal                         Respiratory- Unlabored breathing                         Cardiovascular- RRR without murmurs or gallops noted                         Gastrointestinal- Obese abdomen soft & NT                             Back- No CVAT, no masses palpated                         Genitourinary- Normal phallus, testes descended without masses, slightly TTP, no edema, no erythema, no inguinal hernias noted                         Rectal- Prostate and rectum not examined due to body habitus and patient position.                         Musculoskeletal: Grossly  intact, FROM x 4                         Neurologic- Grossly intact, alert & oriented x 4                          Psychiatric- Normal affect, normal reality contact                     Labs:   U/A-    Urine Dip Results:   Time collected: 1225  Glucose: Negative  Bilirubin: Negative  Ketones: Negative  Urine Specific Gravity: 1.010  Blood (urine): Small (1+)  pH: 6.0  Protein: Negative  Urobilinogen: Normal   Nitrite: Negative  Leukocytes: Negative          A:  1) Intermittent testicle pain- etiology undetermined   2) H/O negative microhematuria workup (03-14-17)  3) PCa screening      P:  1) Schedule scrotal US.   2) Check PSA today.  3) RTC in one year (DRE & PSA).  4) To nearest hospital ER for sharp, persistent testicle pain- R/O torsion.      I spent greater than 50% of 15 minute visit in counseling and or discussion of intermittent testicular torsion.      Spencer Arenas, Spencer [name]  11/23/2018, 13:18    Spencer [name]., Spencer [name], Spencer [name]  Director, Division of General Urology  Associate Residency Program Director  Associate Professor of Urology  [facility]    ADDENDUM:    Results for Spencer, [name] (MRN [id number]) as of 11/23/2018 14:45   Ref. Range 07/15/2011 11:39 02/02/2017 11:37 11/23/2018 13:20   PROSTATE SPECIFIC AG Latest Ref Range: <=4.0 ng/mL 0.4 0.4 0.4       Spencer Arenas, Spencer [name]  11/23/2018, 14:45  Spencer [name], Spencer Hageman., Spencer [name], Spencer [name]  Director, Division of General Urology  Associate Residency Program Director  Associate Professor of Urology  [facility] Department of Urology

## 2019-05-22 ENCOUNTER — Telehealth: Payer: Self-pay | Admitting: *Deleted

## 2019-05-22 DIAGNOSIS — Z20822 Contact with and (suspected) exposure to covid-19: Secondary | ICD-10-CM

## 2019-05-22 NOTE — Telephone Encounter (Signed)
Referred for covid19 testing due to symptoms, by [facility]. Appointment made for tomorrow at the [place] site @8 :45am. No local PCP.

## 2019-05-23 ENCOUNTER — Other Ambulatory Visit: Payer: Self-pay

## 2019-05-23 DIAGNOSIS — Z20822 Contact with and (suspected) exposure to covid-19: Secondary | ICD-10-CM

## 2019-05-27 LAB — NOVEL CORONAVIRUS, NAA: SARS-CoV-2, NAA: NOT DETECTED

## 2019-07-21 ENCOUNTER — Encounter: Payer: Self-pay | Admitting: Emergency Medicine

## 2019-07-21 ENCOUNTER — Emergency Department
Admission: EM | Admit: 2019-07-21 | Discharge: 2019-07-21 | Disposition: A | Discharge status: Home / Self Care | Payer: Federal, State, Local not specified - PPO | Attending: Emergency Medicine | Admitting: Emergency Medicine

## 2019-07-21 ENCOUNTER — Emergency Department: Payer: Federal, State, Local not specified - PPO

## 2019-07-21 DIAGNOSIS — M5412 Radiculopathy, cervical region: Secondary | ICD-10-CM | POA: Insufficient documentation

## 2019-07-21 DIAGNOSIS — M542 Cervicalgia: Secondary | ICD-10-CM | POA: Diagnosis present

## 2019-07-21 HISTORY — DX: Irritable bowel syndrome, unspecified: K58.9

## 2019-07-21 HISTORY — DX: Other chronic pain: G89.29

## 2019-07-21 LAB — CBC
HCT: 41.1 % (ref 39.0–52.0)
Hemoglobin: 14.4 g/dL (ref 13.0–17.0)
MCH: 29.6 pg (ref 26.0–34.0)
MCHC: 35 g/dL (ref 30.0–36.0)
MCV: 84.6 fL (ref 80.0–100.0)
Platelets: 221 10*3/uL (ref 150–400)
RBC: 4.86 MIL/uL (ref 4.22–5.81)
RDW: 12.6 % (ref 11.5–15.5)
WBC: 7.8 10*3/uL (ref 4.0–10.5)
nRBC: 0 % (ref 0.0–0.2)

## 2019-07-21 LAB — BASIC METABOLIC PANEL
Anion gap: 9 (ref 5–15)
BUN: 11 mg/dL (ref 6–20)
CO2: 27 mmol/L (ref 22–32)
Calcium: 9.3 mg/dL (ref 8.9–10.3)
Chloride: 102 mmol/L (ref 98–111)
Creatinine, Ser: 0.91 mg/dL (ref 0.61–1.24)
GFR calc Af Amer: >60 mL/min (ref >60)
GFR calc non Af Amer: >60 mL/min (ref >60)
Glucose, Bld: 95 mg/dL (ref 70–99)
Potassium: 3.8 mmol/L (ref 3.5–5.1)
Sodium: 138 mmol/L (ref 135–145)

## 2019-07-21 LAB — TROPONIN I (HIGH SENSITIVITY): Troponin I (High Sensitivity): 2 ng/L (ref <18)

## 2019-07-21 MED ORDER — NAPROXEN 500 MG PO TABS: 500.0000 mg | ORAL_TABLET | Freq: Two times a day (BID) | ORAL | 2 refills | Status: DC

## 2019-07-21 MED ORDER — SODIUM CHLORIDE 0.9% FLUSH: 3.0000 mL | Freq: Once | INTRAVENOUS | Status: DC

## 2019-07-21 MED ORDER — PREDNISONE 50 MG PO TABS: 50.0000 mg | ORAL_TABLET | Freq: Every day | ORAL | 0 refills | Status: DC

## 2019-07-21 NOTE — ED Provider Notes (Signed)
[facility] Emergency Department Provider Note   ____________________________________________    I have reviewed the triage vital signs and the nursing notes.   HISTORY  Chief Complaint Shoulder Pain, Neck Pain, and Chest Pain     HPI Leanord AsalCharles Bo is a 53 y.o. male who presents with primary complaint of intermittent tingling in his fingers over the last 2 weeks.  Along with this he has had pain in his neck radiating to his left hand.  Today he had pain in the upper left part of his chest with radiation to his hand became concerned.  He denies weakness.  No headaches.  No nausea or vomiting or diaphoresis.  No shortness of breath.  No cough.  No pleurisy no chest pain currently.  Past Medical History:  Diagnosis Date  . Chronic back pain   . IBS (irritable bowel syndrome)     There are no active problems to display for this patient.   History reviewed. No pertinent surgical history.  Prior to Admission medications   Medication Sig Start Date End Date Taking? Authorizing Provider  naproxen (NAPROSYN) 500 MG tablet Take 1 tablet (500 mg total) by mouth 2 (two) times daily with a meal. 07/21/19   Jene EveryKinner, [place] Seaborn, MD  predniSONE (DELTASONE) 50 MG tablet Take 1 tablet (50 mg total) by mouth daily with breakfast. 07/21/19   Jene EveryKinner, Guelda Batson, MD     Allergies Wasp venom protein, Tape, and Wheat bran  History reviewed. No pertinent family history.  Social History Social History   Tobacco Use  . Smoking status: Never Smoker  . Smokeless tobacco: Never Used  Substance Use Topics  . Alcohol use: Not Currently  . Drug use: Never    Review of Systems  Constitutional: No fever/chills Eyes: No visual changes.  ENT: Neck discomfort as above Cardiovascular: As above Respiratory: Denies shortness of breath. Gastrointestinal: No abdominal pain.  No nausea, no vomiting.   Genitourinary: Negative for dysuria. Musculoskeletal: No weakness Skin:  Negative for rash. Neurological: As above   ____________________________________________   PHYSICAL EXAM:  VITAL SIGNS: ED Triage Vitals  Enc Vitals Group     BP 07/21/19 1538 (!) 140/93     Pulse Rate 07/21/19 1538 76     Resp 07/21/19 1538 18     Temp 07/21/19 1538 98.5 F (36.9 C)     Temp Source 07/21/19 1538 Oral     SpO2 07/21/19 1538 98 %     Weight 07/21/19 1541 97.5 kg (215 lb)     Height 07/21/19 1541 1.778 m (5\' 10" )     Head Circumference --      Peak Flow --      Pain Score 07/21/19 1540 6     Pain Loc --      Pain Edu? --      Excl. in GC? --     Constitutional: Alert and oriented.  Eyes: Conjunctivae are normal.  Head: Atraumatic. Nose: No congestion/rhinnorhea. Mouth/Throat: Mucous membranes are moist.   Neck:  Painless ROM, no vertebral tenderness palpation  cardiovascular: Normal rate, regular rhythm. Grossly normal heart sounds.  Good peripheral circulation. Respiratory: Normal respiratory effort.  No retractions.    Musculoskeletal:Warm and well perfused.  Normal strength in all extremities Neurologic:  Normal speech and language. No gross focal neurologic deficits are appreciated.  No neuro deficits Skin:  Skin is warm, dry and intact. No rash noted. Psychiatric: Mood and affect are normal. Speech and behavior are normal.  ____________________________________________   LABS (all labs ordered are listed, but only abnormal results are displayed)  Labs Reviewed  BASIC METABOLIC PANEL  CBC  TROPONIN I (HIGH SENSITIVITY)  TROPONIN I (HIGH SENSITIVITY)   ____________________________________________  EKG  ED ECG REPORT I, Lavonia Drafts, the attending physician, personally viewed and interpreted this ECG.  Date: 07/21/2019  Rhythm: normal sinus rhythm QRS Axis: normal Intervals: normal ST/T Wave abnormalities: normal Narrative Interpretation: no evidence of acute ischemia  ____________________________________________  RADIOLOGY   Chest x-ray unremarkable ____________________________________________   PROCEDURES  Procedure(s) performed: No  Procedures   Critical Care performed: No ____________________________________________   INITIAL IMPRESSION / ASSESSMENT AND PLAN / ED COURSE  Pertinent labs & imaging results that were available during my care of the patient were reviewed by me and considered in my medical decision making (see chart for details).  Patient well-appearing and in no acute distress, symptoms are consistent with ACS more likely cervical radiculopathy given neck pain and tingling in the fingers.  EKG is reassuring.  High sensitive troponin is normal.  Lab work otherwise reassuring.  Patient feels much better after hearing this, will trial prednisone, Naprosyn, outpatient follow-up with spine    ____________________________________________   FINAL CLINICAL IMPRESSION(S) / ED DIAGNOSES  Final diagnoses:  Cervical radiculopathy        Note:  This document was prepared using Dragon voice recognition software and may include unintentional dictation errors.   Lavonia Drafts, MD 07/21/19 1911

## 2019-07-21 NOTE — ED Notes (Signed)
Pt states he has been having left shoulder and neck pain for two weeks. Pain radiates down into left side of chest,left arm and into fourth and fifth digit, feels numb and tingles. Pain is worst with exertion and intermittent with rest.

## 2019-07-21 NOTE — ED Triage Notes (Signed)
Pt states that around 1300 he noticed tingling and numbness to the left forearm, pt also c/o left shoulder, left neck, and shoulder blade pain as well as left chest

## 2019-07-21 NOTE — ED Notes (Signed)
Lab stated that they did not have blood that was collected in triage - pt blood redrawn and sent to lab

## 2019-11-13 ENCOUNTER — Other Ambulatory Visit (INDEPENDENT_AMBULATORY_CARE_PROVIDER_SITE_OTHER): Payer: Self-pay | Admitting: Urology

## 2019-11-13 DIAGNOSIS — Z125 Encounter for screening for malignant neoplasm of prostate: Secondary | ICD-10-CM

## 2019-11-15 ENCOUNTER — Encounter (INDEPENDENT_AMBULATORY_CARE_PROVIDER_SITE_OTHER): Payer: Self-pay | Admitting: Urology

## 2019-11-27 ENCOUNTER — Other Ambulatory Visit: Payer: Self-pay

## 2019-11-27 ENCOUNTER — Emergency Department: Payer: No Typology Code available for payment source

## 2019-11-27 ENCOUNTER — Emergency Department
Admission: EM | Admit: 2019-11-27 | Discharge: 2019-11-27 | Disposition: A | Discharge status: Home / Self Care | Payer: No Typology Code available for payment source | Attending: Emergency Medicine | Admitting: Emergency Medicine

## 2019-11-27 DIAGNOSIS — Z7982 Long term (current) use of aspirin: Secondary | ICD-10-CM | POA: Insufficient documentation

## 2019-11-27 DIAGNOSIS — U071 COVID-19: Secondary | ICD-10-CM | POA: Diagnosis not present

## 2019-11-27 DIAGNOSIS — Z20822 Contact with and (suspected) exposure to covid-19: Secondary | ICD-10-CM

## 2019-11-27 DIAGNOSIS — J069 Acute upper respiratory infection, unspecified: Secondary | ICD-10-CM

## 2019-11-27 DIAGNOSIS — R05 Cough: Secondary | ICD-10-CM | POA: Diagnosis present

## 2019-11-27 HISTORY — DX: Migraine, unspecified, not intractable, without status migrainosus: G43.909

## 2019-11-27 LAB — SARS CORONAVIRUS 2 (TAT 6-24 HRS): SARS Coronavirus 2: POSITIVE — AB

## 2019-11-27 NOTE — Discharge Instructions (Addendum)
Follow-up with your primary care provider if any continued problems.  Return to the emergency department if any shortness of breath or difficulty breathing.  Stay hydrated with drinking fluids.  Tylenol or ibuprofen as needed for fever, body aches or headache.  Your test results should be back tomorrow.  If your test results is positive you will need an additional 10 days in quarantine.  You are to quarantine until you receive the results of your test.

## 2019-11-27 NOTE — ED Triage Notes (Signed)
Pt c/o cough with SOB since yesterday with bodyaches , works at the [employer]

## 2019-11-27 NOTE — ED Notes (Signed)
Started having itchy throat, cough and pain in upper chest yesterday.  During night body aches, chills and sweats.  This am temp was 100.1

## 2019-11-27 NOTE — ED Provider Notes (Signed)
[facility] Emergency Department Provider Note  ____________________________________________   First MD Initiated Contact with Patient 11/27/19 [phone]     (approximate)  I have reviewed the triage vital signs and the nursing notes.   HISTORY  Chief Complaint URI   HPI Thomas Blackburn is a 53 y.o. male presents to the ED with complaint of cough, fever, chills starting yesterday.  Patient states that he felt worse this morning.  He states that his highest temperature at home was 102.  He has been taking over-the-counter medication to control his temperature.  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  Patient states that his wife has no symptoms and he is unaware of any known exposure to Covid.  Currently rates his pain as an 8 out of 10.      Past Medical History:  Diagnosis Date  . Chronic back pain   . IBS (irritable bowel syndrome)   . Migraine     There are no problems to display for this patient.   History reviewed. No pertinent surgical history.  Prior to Admission medications   Medication Sig Start Date End Date Taking? Authorizing Provider  aspirin EC 81 MG tablet Take 81 mg by mouth daily.   Yes [provider]  methocarbamol (ROBAXIN) 500 MG tablet Take 500 mg by mouth 4 (four) times daily.   Yes [provider]  pantoprazole (PROTONIX) 20 MG tablet Take 20 mg by mouth daily.   Yes [provider]  SUMAtriptan (IMITREX) 100 MG tablet Take 100 mg by mouth every 2 (two) hours as needed for migraine. May repeat in 2 hours if headache persists or recurs.   Yes [provider]    Allergies Wasp venom protein, Tape, and Wheat bran  No family history on file.  Social History Social History   Tobacco Use  . Smoking status: Never Smoker  . Smokeless tobacco: Never Used  Substance Use Topics  . Alcohol use: Not Currently  . Drug use: Never     Review of Systems Constitutional: Positive fever/chills Cardiovascular: Denies chest pain. Respiratory: Denies shortness of breath. Gastrointestinal: No abdominal pain.  No nausea, no vomiting.  Positive diarrhea.   Musculoskeletal: Positive for muscle aches. Skin: Negative for rash. Neurological: Negative for headaches, focal weakness or numbness. ___________________________________________   PHYSICAL EXAM:  VITAL SIGNS: ED Triage Vitals  Enc Vitals Group     BP 11/27/19 0727 (!) 148/96     Pulse Rate 11/27/19 0727 (!) 107     Resp 11/27/19 0727 18     Temp 11/27/19 0727 99.7 F (37.6 C)     Temp Source 11/27/19 0727 Oral     SpO2 11/27/19 0727 97 %     Weight 11/27/19 0727 218 lb (98.9 kg)     Height 11/27/19 0727 5\' 10"  (1.778 m)     Head Circumference --      Peak Flow --      Pain Score 11/27/19 0729 8     Pain Loc --      Pain Edu? --      Excl. in GC? --     Constitutional: Alert and oriented. Well appearing and in no acute distress. Eyes: Conjunctivae are normal.  Head: Atraumatic. Nose: No congestion/rhinnorhea. Neck: No stridor.   Cardiovascular: Normal rate, regular rhythm. Grossly normal heart sounds.  Good peripheral circulation. Respiratory: Normal respiratory effort.  No retractions. Lungs CTAB.  Gastrointestinal: Soft and nontender. No distention. Musculoskeletal: Moves upper and lower extremities any difficulty.  Normal gait was noted. Neurologic:  Normal speech and language. No gross focal neurologic deficits are appreciated. No gait instability. Skin:  Skin is warm, dry and intact. No rash noted. Psychiatric: Mood and affect are normal. Speech and behavior are normal.  ____________________________________________   LABS (all labs ordered are listed, but only abnormal results are displayed)  Labs Reviewed  SARS CORONAVIRUS 2 (TAT 6-24 HRS)     RADIOLOGY   Official radiology report(s): DG Chest Portable 1 View  Result Date:  11/27/2019 CLINICAL DATA:  Itchy throat cough and pain. EXAM: PORTABLE CHEST 1 VIEW COMPARISON:  07/21/2019 FINDINGS: Cardiomediastinal contours are normal. Lungs are clear. Nodes signs of pleural effusion. No acute bone finding. IMPRESSION: No active disease. Electronically Signed   By: Zetta Bills M.D.   On: 11/27/2019 08:34    ____________________________________________   PROCEDURES  Procedure(s) performed (including Critical Care):  Procedures  ____________________________________________   INITIAL IMPRESSION / ASSESSMENT AND PLAN / ED COURSE  As part of my medical decision making, I reviewed the following data within the electronic MEDICAL RECORD NUMBER Notes from prior ED visits and [place] Controlled Substance Database  53 year old male presents to the ED with sudden onset of fever, chills, cough and diarrhea.  He states that his temperature was as high as 102 last evening.  He has been controlling it with Tylenol and ibuprofen.  Patient has no known Covid exposure.  Chest x-ray was negative for any active disease.  Covid test was obtained and patient is aware that he will get the results of this tomorrow.  He is quarantine at home until he is received the results.  He is aware that if it is positive that he will need to quarantine for an additional 10 days.  ____________________________________________   FINAL CLINICAL IMPRESSION(S) / ED DIAGNOSES  Final diagnoses:  Viral URI with cough  COVID-19 ruled out     ED Discharge Orders    None       Note:  This document was prepared using Dragon voice recognition software and may include unintentional dictation errors.    Johnn Hai, PA-C 11/27/19 1035    Carrie Mew, MD 11/27/19 [phone]

## 2019-11-28 ENCOUNTER — Telehealth: Payer: Self-pay | Admitting: Emergency Medicine

## 2019-11-28 NOTE — Telephone Encounter (Signed)
Called patient and informed him of positive covid result.  cdc guidelines reviewed for isolation

## 2020-02-06 ENCOUNTER — Other Ambulatory Visit: Payer: Self-pay | Admitting: Rehabilitation

## 2020-02-06 DIAGNOSIS — M5412 Radiculopathy, cervical region: Secondary | ICD-10-CM

## 2020-02-17 ENCOUNTER — Ambulatory Visit
Admission: RE | Admit: 2020-02-17 | Discharge: 2020-02-17 | Disposition: A | Discharge status: Home / Self Care | Payer: Federal, State, Local not specified - PPO | Source: Ambulatory Visit | Attending: Rehabilitation | Admitting: Rehabilitation

## 2020-02-17 ENCOUNTER — Other Ambulatory Visit: Payer: Self-pay

## 2020-02-17 DIAGNOSIS — M5412 Radiculopathy, cervical region: Secondary | ICD-10-CM

## 2020-07-27 IMAGING — MR MR CERVICAL SPINE W/O CM
5 series · 39 of 48 positions shown · non-contrast
Comparison: None.

CLINICAL DATA: Intermittent in neck pain radiating into both arms.

EXAM:
MRI CERVICAL SPINE WITHOUT CONTRAST
TECHNIQUE: Multiplanar, multisequence MR imaging of the cervical spine was
performed. No intravenous contrast was administered.

[Series 5: T2 · sagittal · 3.0mm · 0.62mm/px · 6 of 15 slices shown (1 of 2)]
[im 1/15]
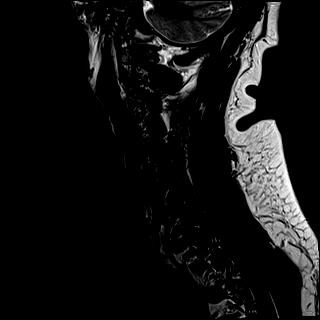
[im 3/15]
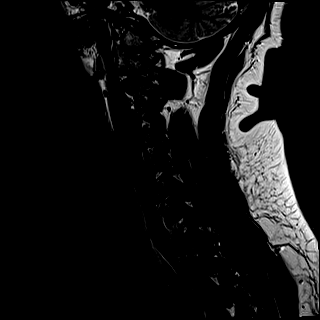
[im 6/15]
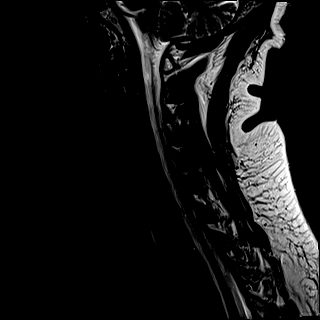
[im 9/15]
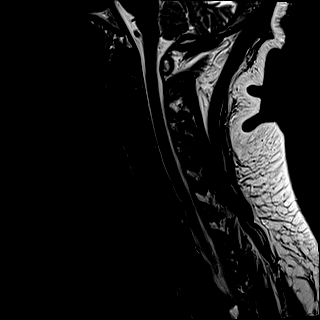
[im 12/15]
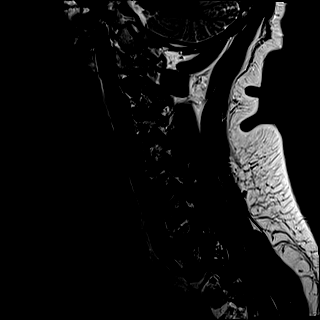
[im 15/15]
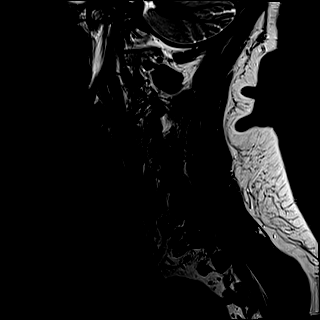

[Series 6: FLAIR · sagittal · 3.0mm · 0.78mm/px · 7 of 15 slices shown]
[im 1/15]
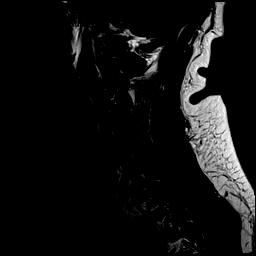
[im 3/15]
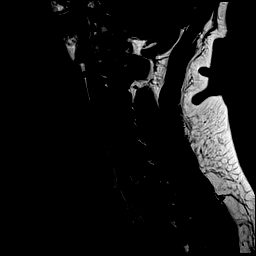
[im 5/15]
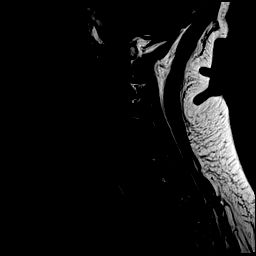
[im 8/15]
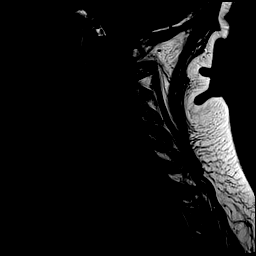
[im 10/15]
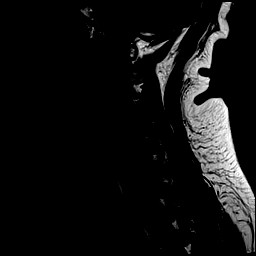
[im 12/15]
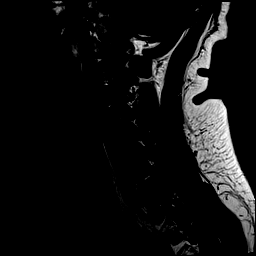
[im 15/15]
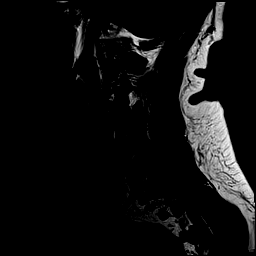

[Series 7: STIR · sagittal · 3.0mm · 0.62mm/px · 7 of 15 slices shown]
[im 1/15]
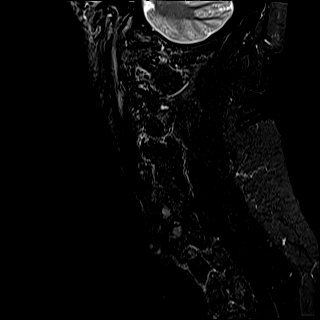
[im 3/15]
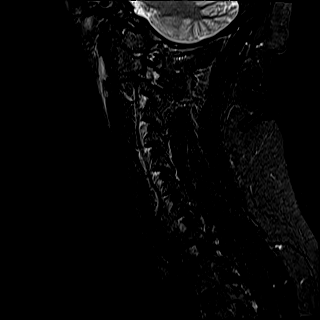
[im 5/15]
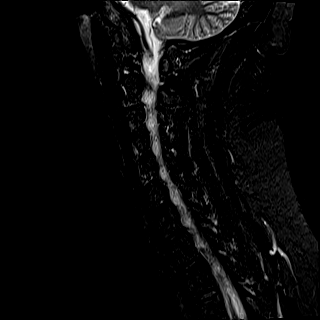
[im 8/15]
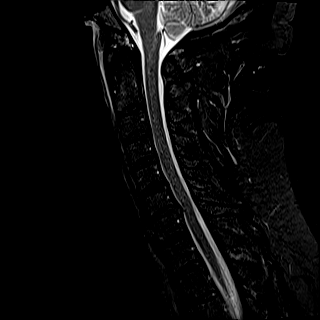
[im 10/15]
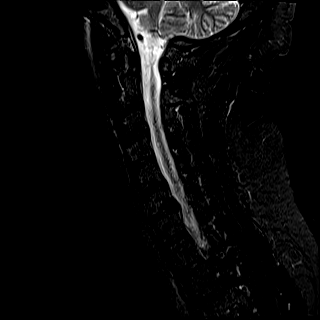
[im 12/15]
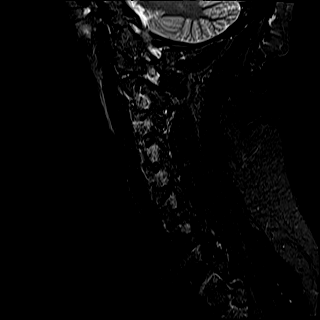
[im 15/15]
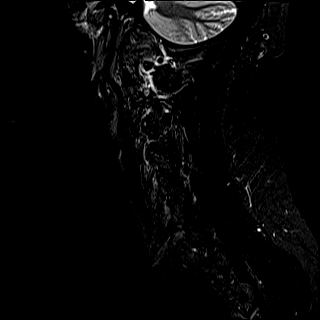

[Series 8: T2 · axial · 3.0mm · 0.70mm/px · z∈[-139,-44]mm · 11 of 29 slices shown (2 of 2)]
[im 1/29]
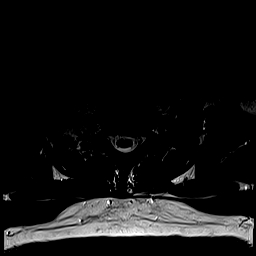
[im 3/29]
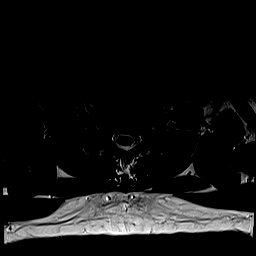
[im 5/29]
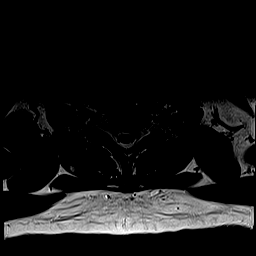
[im 7/29]
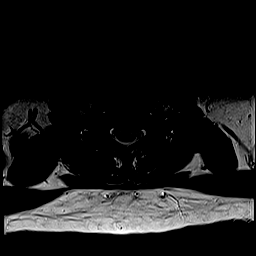
[im 9/29]
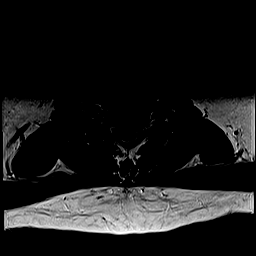
[im 11/29]
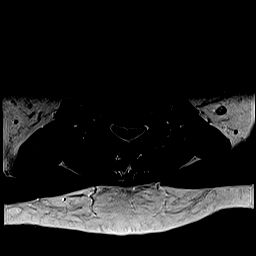
[im 13/29]
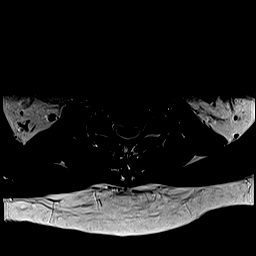
[im 16/29]
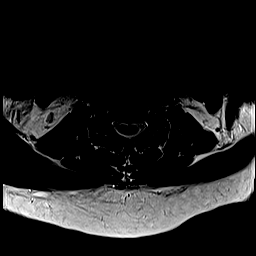
[im 20/29]
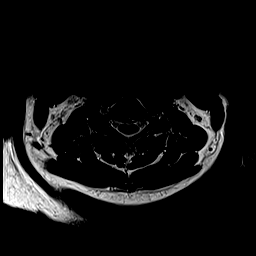
[im 24/29]
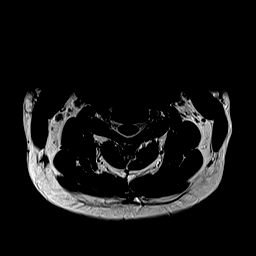
[im 29/29]
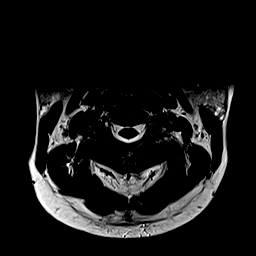

[Series 9: ax mpgr · axial · 3.0mm · 0.35mm/px · z∈[-139,-44]mm · 8 of 29 slices shown]
[im 1/29]
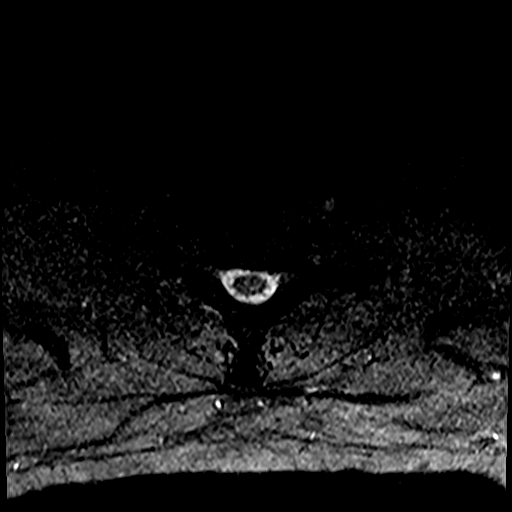
[im 5/29]
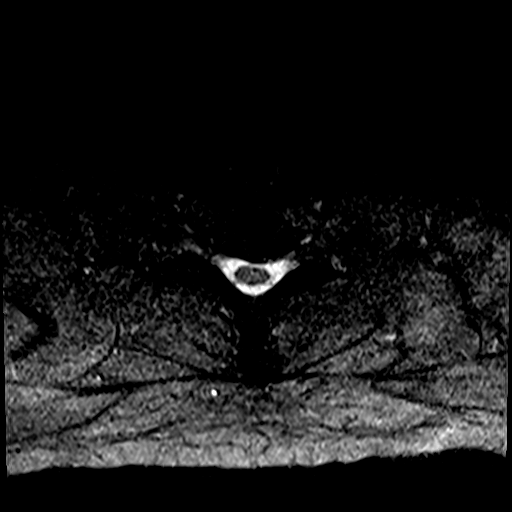
[im 9/29]
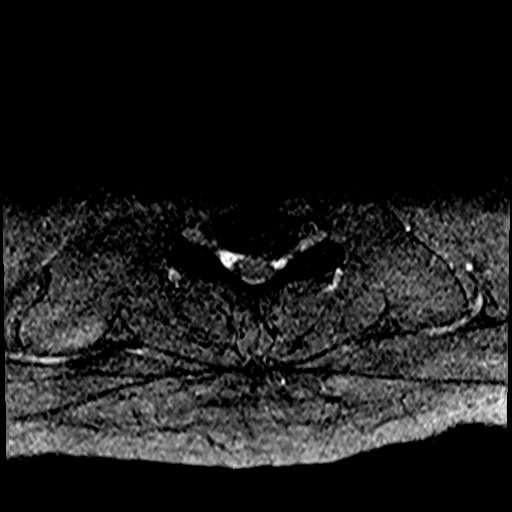
[im 13/29]
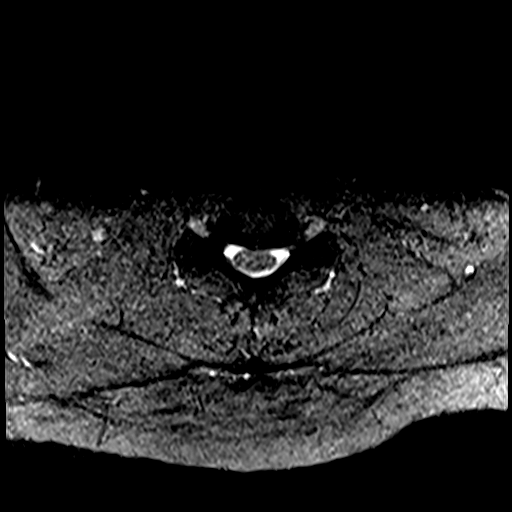
[im 16/29]
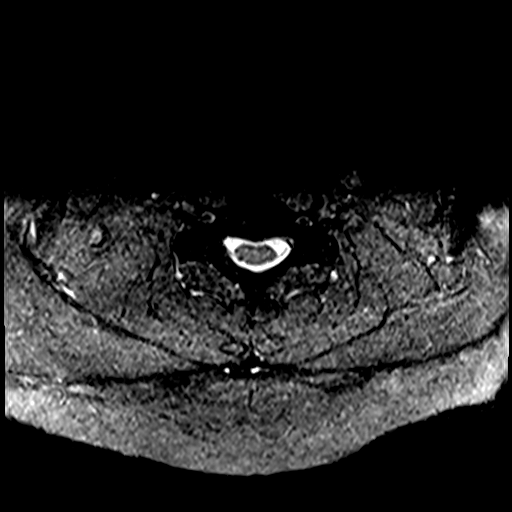
[im 20/29]
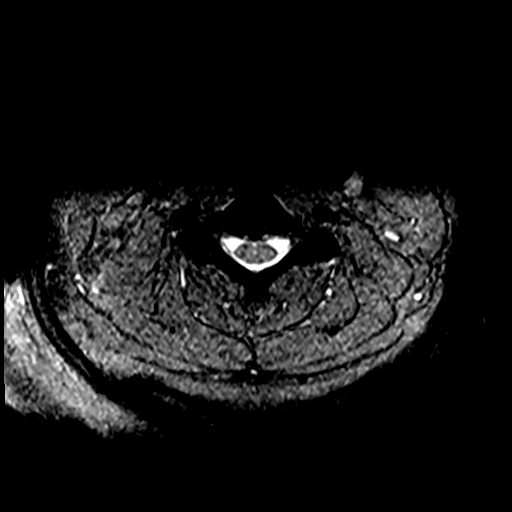
[im 24/29]
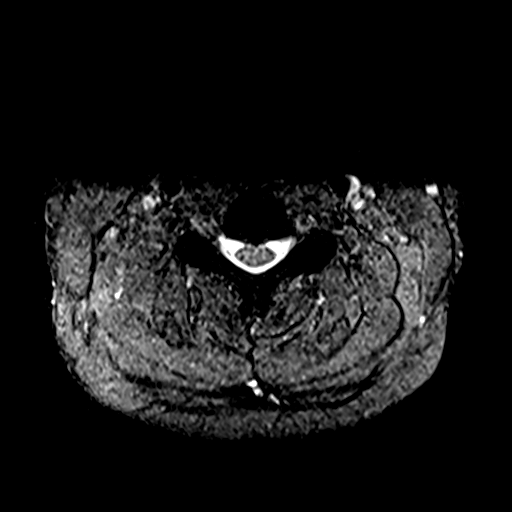
[im 29/29]
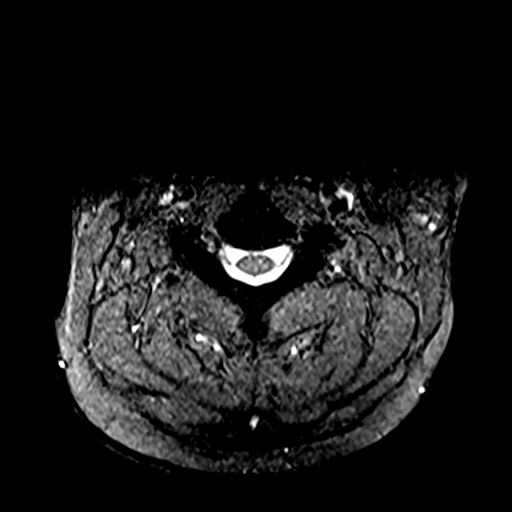

[39 of 48 positions shown; findings below may reference images not displayed]

FINDINGS: Alignment: Straightening of the cervical curvature.

Vertebrae: No fracture, evidence of discitis, or bone lesion.

Cord: Flattening of the anterior cord at C6-7 without cord signal
abnormality.

Posterior Fossa, vertebral arteries, paraspinal tissues: Negative.

Disc levels:

C2-3: No spinal canal or neural foraminal stenosis.

C3-4: Tiny posterior disc protrusion without significant spinal
canal stenosis. Uncovertebral and facet degenerative changes
resulting in minimal left neural foraminal narrowing.

C4-5: Small posterior disc protrusion without significant spinal
canal stenosis. Uncovertebral and facet degenerative changes
resulting in mild right neural foraminal narrowing.

C5-6: Small posterior disc protrusion without significant spinal
canal stenosis. No significant neural foraminal narrowing.

C6-7: A left-sided posterior disc protrusion causing flattening of
the anterior cord and resulting in mild spinal canal stenosis. There
is no cord signal abnormality. Uncovertebral and facet degenerative
changes resulting in mild-to-moderate left neural foraminal
narrowing.

C7-T1: Small posterior disc protrusion without significant spinal
canal stenosis. No significant neural foraminal narrowing.
IMPRESSION: 1. Mild multilevel degenerative changes of the cervical spine as
described above, worst at C6-7 where a left-sided posterior disc
protrusion causing flattening of the anterior cord and mild spinal
canal stenosis. No cord signal abnormality.
2. No high-grade neural foraminal narrowing.

## 2020-08-12 ENCOUNTER — Emergency Department
Admission: EM | Admit: 2020-08-12 | Discharge: 2020-08-12 | Disposition: A | Discharge status: Home / Self Care | Payer: No Typology Code available for payment source | Attending: Emergency Medicine | Admitting: Emergency Medicine

## 2020-08-12 ENCOUNTER — Emergency Department: Payer: No Typology Code available for payment source

## 2020-08-12 ENCOUNTER — Other Ambulatory Visit: Payer: Self-pay

## 2020-08-12 DIAGNOSIS — G8929 Other chronic pain: Secondary | ICD-10-CM | POA: Diagnosis not present

## 2020-08-12 DIAGNOSIS — G43009 Migraine without aura, not intractable, without status migrainosus: Secondary | ICD-10-CM

## 2020-08-12 DIAGNOSIS — R208 Other disturbances of skin sensation: Secondary | ICD-10-CM

## 2020-08-12 DIAGNOSIS — Z7982 Long term (current) use of aspirin: Secondary | ICD-10-CM | POA: Insufficient documentation

## 2020-08-12 DIAGNOSIS — M546 Pain in thoracic spine: Secondary | ICD-10-CM | POA: Diagnosis not present

## 2020-08-12 DIAGNOSIS — R203 Hyperesthesia: Secondary | ICD-10-CM | POA: Insufficient documentation

## 2020-08-12 DIAGNOSIS — I2510 Atherosclerotic heart disease of native coronary artery without angina pectoris: Secondary | ICD-10-CM | POA: Diagnosis not present

## 2020-08-12 DIAGNOSIS — Z955 Presence of coronary angioplasty implant and graft: Secondary | ICD-10-CM | POA: Diagnosis not present

## 2020-08-12 DIAGNOSIS — G43019 Migraine without aura, intractable, without status migrainosus: Secondary | ICD-10-CM | POA: Diagnosis not present

## 2020-08-12 DIAGNOSIS — R0789 Other chest pain: Secondary | ICD-10-CM | POA: Insufficient documentation

## 2020-08-12 LAB — BASIC METABOLIC PANEL
Anion gap: 9 (ref 5–15)
BUN: 10 mg/dL (ref 6–20)
CO2: 28 mmol/L (ref 22–32)
Calcium: 9.3 mg/dL (ref 8.9–10.3)
Chloride: 102 mmol/L (ref 98–111)
Creatinine, Ser: 0.91 mg/dL (ref 0.61–1.24)
GFR calc Af Amer: >60 mL/min (ref >60)
GFR calc non Af Amer: >60 mL/min (ref >60)
Glucose, Bld: 122 mg/dL — ABNORMAL HIGH (ref 70–99)
Potassium: 4.5 mmol/L (ref 3.5–5.1)
Sodium: 139 mmol/L (ref 135–145)

## 2020-08-12 LAB — CBC
HCT: 40.8 % (ref 39.0–52.0)
Hemoglobin: 14.6 g/dL (ref 13.0–17.0)
MCH: 29.8 pg (ref 26.0–34.0)
MCHC: 35.8 g/dL (ref 30.0–36.0)
MCV: 83.3 fL (ref 80.0–100.0)
Platelets: 221 10*3/uL (ref 150–400)
RBC: 4.9 MIL/uL (ref 4.22–5.81)
RDW: 12.8 % (ref 11.5–15.5)
WBC: 5.9 10*3/uL (ref 4.0–10.5)
nRBC: 0 % (ref 0.0–0.2)

## 2020-08-12 LAB — TROPONIN I (HIGH SENSITIVITY)
Troponin I (High Sensitivity): 3 ng/L (ref <18)
Troponin I (High Sensitivity): 2 ng/L (ref <18)

## 2020-08-12 MED ORDER — KETOROLAC TROMETHAMINE 30 MG/ML IJ SOLN
30.0000 mg | Freq: Once | INTRAMUSCULAR | Status: AC
  Administered 2020-08-12: 30 mg via INTRAMUSCULAR
  Filled 2020-08-12: qty 1

## 2020-08-12 MED ORDER — ALUM & MAG HYDROXIDE-SIMETH 200-200-20 MG/5ML PO SUSP
30.0000 mL | Freq: Once | ORAL | Status: AC
  Administered 2020-08-12: 30 mL via ORAL
  Filled 2020-08-12: qty 30

## 2020-08-12 MED ORDER — DROPERIDOL 2.5 MG/ML IJ SOLN
1.2500 mg | Freq: Once | INTRAMUSCULAR | Status: AC
  Administered 2020-08-12: 1.25 mg via INTRAMUSCULAR
  Filled 2020-08-12: qty 2

## 2020-08-12 MED ORDER — ACETAMINOPHEN 500 MG PO TABS
1000.0000 mg | ORAL_TABLET | Freq: Once | ORAL | Status: AC
  Administered 2020-08-12: 1000 mg via ORAL
  Filled 2020-08-12: qty 2

## 2020-08-12 MED ORDER — LIDOCAINE VISCOUS HCL 2 % MT SOLN
15.0000 mL | Freq: Once | OROMUCOSAL | Status: AC
  Administered 2020-08-12: 15 mL via ORAL
  Filled 2020-08-12: qty 15

## 2020-08-12 NOTE — Discharge Instructions (Signed)
You were seen in the ED because of your chest pain.  Thankfully is no evidence of strain or damage to your heart.  I think the pain that you are feeling in yourr chest and back is actually related to your migraine.  Very sensitive skin can be a hallmark/classic symptoms of a migraine.  If you develop any isolated chest pain, especially without a headache, please return to the ED.

## 2020-08-12 NOTE — ED Notes (Signed)
Pt signed paper copy of dc consent form

## 2020-08-12 NOTE — ED Triage Notes (Signed)
Pt c/o pain to the center of his chest, states "my skin feels very sensitive like a bruise and in my back". For the past 4 days Pt states he was seen at the [place] and tested neg for covid. Pt is in NAD.

## 2020-08-12 NOTE — ED Provider Notes (Signed)
[facility] Emergency Department Provider Note ____________________________________________   First MD Initiated Contact with Patient 08/12/20 1136     (approximate)  I have reviewed the triage vital signs and the nursing notes.  HISTORY  Chief Complaint Chest Pain   HPI Thomas Blackburn is a 54 y.o. malewho presents to the ED for evaluation of chest pain.   Chart review indicates history of CAD previously followed by [facility] cardiology. Patient reports a history of migraines on Imitrex.  GERD on Protonix.  Patient reports having a typical migrainous headache over the past 4 days that is bitemporal, aching in nature, 7/10 intensity and not resolved with his home Imitrex.  He reports associated allodynia, or pain to his chest and back when he is wearing a T-shirt.  He reports it feels like he is bruised or beaten in terms of the achiness and the severity of the pain caused by his simple T-shirt.  He reports feeling better when he is shirtless.  Patient reports the symptoms have been coexisting with his typical migrainous headache, and he has no exertional worsening of his chest pain.  He reports associated photophobia.  Patient denies any shortness of breath, cough, fever, syncope, neck stiffness, trauma, falls, vomiting, abdominal pain.  He reports p.o. intake at baseline and toileting at baseline.  He has not taken any medications prior to arrival besides his Imitrex.    Past Medical History:  Diagnosis Date   Chronic back pain    IBS (irritable bowel syndrome)    Migraine     There are no problems to display for this patient.   History reviewed. No pertinent surgical history.  Prior to Admission medications   Medication Sig Start Date End Date Taking? Authorizing Provider  aspirin EC 81 MG tablet Take 81 mg by mouth daily.    [provider]  methocarbamol (ROBAXIN) 500 MG tablet Take 500 mg by mouth 4 (four) times daily.    [provider]  pantoprazole (PROTONIX) 20 MG tablet Take 20 mg by mouth daily.    [provider]  SUMAtriptan (IMITREX) 100 MG tablet Take 100 mg by mouth every 2 (two) hours as needed for migraine. May repeat in 2 hours if headache persists or recurs.    [provider]    Allergies Wasp venom protein, Tape, and Wheat bran  No family history on file.  Social History Social History   Tobacco Use   Smoking status: Never Smoker   Smokeless tobacco: Never Used  Substance Use Topics   Alcohol use: Not Currently   Drug use: Never    Review of Systems  Constitutional: No fever/chills Eyes: No visual changes.  Photophobia. ENT: No sore throat. Cardiovascular: Positive for chest pain. Respiratory: Denies shortness of breath. Gastrointestinal: No abdominal pain.  No nausea, no vomiting.  No diarrhea.  No constipation. Genitourinary: Negative for dysuria. Musculoskeletal: Negative for back pain. Skin: Negative for rash. Neurological: Negative for focal weakness or numbness.  Positive for headache.   ____________________________________________   PHYSICAL EXAM:  VITAL SIGNS: Vitals:   08/12/20 0729 08/12/20 1136  BP: (!) 156/88 (!) 146/91  Pulse: 71 (!) 58  Resp: 18 16  Temp: 98.1 F (36.7 C)   SpO2: 98% 100%     Constitutional: Alert and oriented. Well appearing and in no acute distress.  Preferring to keep his hoodie over his head and covering his eyes due to photophobia. Eyes: Conjunctivae are normal. PERRL. EOMI. Head: Atraumatic. Nose: No  congestion/rhinnorhea. Mouth/Throat: Mucous membranes are moist.  Oropharynx non-erythematous. Neck: No stridor. No cervical spine tenderness to palpation. Cardiovascular: Normal rate, regular rhythm. Grossly normal heart sounds.  Good peripheral circulation. Respiratory: Normal respiratory effort.  No retractions. Lungs CTAB. Gastrointestinal: Soft , nondistended, nontender to palpation. No abdominal  bruits. No CVA tenderness. Musculoskeletal: No lower extremity tenderness nor edema.  No joint effusions. No signs of acute trauma. No skin changes or signs of trauma to his chest or back at the site in question.  Nontender to palpation without induration, fluctuance, obvious muscle spasm.  Neurologic:  Normal speech and language. No gross focal neurologic deficits are appreciated. No gait instability noted. Cranial nerves II through XII intact 5/5 strength and sensation in all 4 extremities Skin:  Skin is warm, dry and intact. No rash noted. Psychiatric: Mood and affect are normal. Speech and behavior are normal.  ____________________________________________   LABS (all labs ordered are listed, but only abnormal results are displayed)  Labs Reviewed  BASIC METABOLIC PANEL - Abnormal; Notable for the following components:      Result Value   Glucose, Bld 122 (*)    All other components within normal limits  CBC  TROPONIN I (HIGH SENSITIVITY)  TROPONIN I (HIGH SENSITIVITY)   ____________________________________________  12 Lead EKG  None, rate of 64 bpm.  Normal axis and intervals.  No evidence of acute ischemia. ____________________________________________  RADIOLOGY  ED MD interpretation: 2 view CXR with 1 cm nodule in the right lung field without evidence of further acute cardiopulmonary pathology.  Official radiology report(s): DG Chest 2 View  Result Date: 08/12/2020 CLINICAL DATA:  54 year old male with history of chest pain. EXAM: CHEST - 2 VIEW COMPARISON:  Chest x-ray 11/27/2019. FINDINGS: 11 mm nodular density projecting over the right mid lung at the confluence of the anterior right third rib and posterior right sixth rib, favored to be artifactual. Lung volumes are normal. No consolidative airspace disease. No pleural effusions. No pneumothorax. Pulmonary vasculature and the cardiomediastinal silhouette are within normal limits. Atherosclerotic calcifications in the  thoracic aorta. IMPRESSION: 1. No radiographic evidence of acute cardiopulmonary disease. 2. Aortic atherosclerosis. 3. 1.1 cm nodular density projecting over the right mid lung seen only on the frontal projection. This is favored to be artifactual, however, close attention on repeat standing PA and lateral chest radiograph is recommended in 1-2 months to ensure the resolution of this finding. Electronically Signed   By: Trudie Reed M.D.   On: 08/12/2020 07:54    ____________________________________________   PROCEDURES and INTERVENTIONS  Procedure(s) performed (including Critical Care):  Procedures  Medications  acetaminophen (TYLENOL) tablet 1,000 mg (1,000 mg Oral Given 08/12/20 1242)  ketorolac (TORADOL) 30 MG/ML injection 30 mg (30 mg Intramuscular Given 08/12/20 1244)  alum & mag hydroxide-simeth (MAALOX/MYLANTA) 200-200-20 MG/5ML suspension 30 mL (30 mLs Oral Given 08/12/20 1243)    And  lidocaine (XYLOCAINE) 2 % viscous mouth solution 15 mL (15 mLs Oral Given 08/12/20 1243)  droperidol (INAPSINE) 2.5 MG/ML injection 1.25 mg (1.25 mg Intramuscular Given 08/12/20 1245)    ____________________________________________   MDM / ED COURSE  54 year old male with known CAD and migraines presents with typical migraine with associated chest pain and back pain, most consistent with allodynia, without evidence of ACS and amenable to outpatient management.  Normal vital signs on room air.  Exam without signs of trauma, skin changes such as herpes zoster, distress or neurovascular deficits.  Patient looks well beyond keeping his hoodie over his eyes  due to photophobia.  Blood work is reassuring without evidence of acute derangements.  EKG is nonischemic and troponin is negative.  Patient symptoms are more consistent with allodynia associated with his migraine than anginal chest pain.  He has no evidence of ACS and his symptoms of headache, chest pain and back pain resolved after single migraine  cocktail.  We discussed outpatient management and following up with his cardiologist.  We discussed return precautions for the ED and patient is medically stable for discharge home.  Clinical Course as of Aug 13 1327  Wed Aug 12, 2020  1321 Reassessed.  Patient reports resolution of symptoms.  Eager to go home.  we discussed return precautions for the ED.   [DS]    Clinical Course User Index [DS] Delton Prairie, MD     ____________________________________________   FINAL CLINICAL IMPRESSION(S) / ED DIAGNOSES  Final diagnoses:  Allodynia  Migraine without aura and without status migrainosus, not intractable  Other chest pain  Acute bilateral thoracic back pain  Coronary artery disease involving native coronary artery of native heart without angina pectoris     ED Discharge Orders    None       Jeannifer Drakeford   Note:  This document was prepared using Dragon voice recognition software and may include unintentional dictation errors.   Delton Prairie, MD 08/12/20 1330

## 2020-10-14 ENCOUNTER — Other Ambulatory Visit: Payer: Self-pay

## 2021-05-10 ENCOUNTER — Other Ambulatory Visit: Payer: Self-pay

## 2021-05-10 ENCOUNTER — Emergency Department
Admission: EM | Admit: 2021-05-10 | Discharge: 2021-05-10 | Disposition: A | Discharge status: Home / Self Care | Payer: No Typology Code available for payment source | Attending: Emergency Medicine | Admitting: Emergency Medicine

## 2021-05-10 ENCOUNTER — Emergency Department: Payer: No Typology Code available for payment source

## 2021-05-10 DIAGNOSIS — K668 Other specified disorders of peritoneum: Secondary | ICD-10-CM | POA: Diagnosis not present

## 2021-05-10 DIAGNOSIS — R1031 Right lower quadrant pain: Secondary | ICD-10-CM | POA: Diagnosis present

## 2021-05-10 DIAGNOSIS — R1084 Generalized abdominal pain: Secondary | ICD-10-CM

## 2021-05-10 DIAGNOSIS — K654 Sclerosing mesenteritis: Secondary | ICD-10-CM

## 2021-05-10 DIAGNOSIS — Z7982 Long term (current) use of aspirin: Secondary | ICD-10-CM | POA: Insufficient documentation

## 2021-05-10 DIAGNOSIS — Z8719 Personal history of other diseases of the digestive system: Secondary | ICD-10-CM | POA: Diagnosis not present

## 2021-05-10 LAB — COMPREHENSIVE METABOLIC PANEL
ALT: 12 U/L (ref 0–44)
AST: 17 U/L (ref 15–41)
Albumin: 4.2 g/dL (ref 3.5–5.0)
Alkaline Phosphatase: 50 U/L (ref 38–126)
Anion gap: 5 (ref 5–15)
BUN: 12 mg/dL (ref 6–20)
CO2: 28 mmol/L (ref 22–32)
Calcium: 9 mg/dL (ref 8.9–10.3)
Chloride: 106 mmol/L (ref 98–111)
Creatinine, Ser: 0.93 mg/dL (ref 0.61–1.24)
GFR, Estimated: >60 mL/min (ref >60)
Glucose, Bld: 115 mg/dL — ABNORMAL HIGH (ref 70–99)
Potassium: 4.3 mmol/L (ref 3.5–5.1)
Sodium: 139 mmol/L (ref 135–145)
Total Bilirubin: 1.4 mg/dL — ABNORMAL HIGH (ref 0.3–1.2)
Total Protein: 7.3 g/dL (ref 6.5–8.1)

## 2021-05-10 LAB — URINALYSIS, COMPLETE (UACMP) WITH MICROSCOPIC
Bacteria, UA: NONE SEEN
Bilirubin Urine: NEGATIVE
Glucose, UA: NEGATIVE mg/dL
Ketones, ur: NEGATIVE mg/dL
Leukocytes,Ua: NEGATIVE
Nitrite: NEGATIVE
Protein, ur: NEGATIVE mg/dL
Specific Gravity, Urine: 1.021 (ref 1.005–1.030)
Squamous Epithelial / HPF: NONE SEEN (ref 0–5)
pH: 5 (ref 5.0–8.0)

## 2021-05-10 LAB — CBC
HCT: 40.6 % (ref 39.0–52.0)
Hemoglobin: 14.2 g/dL (ref 13.0–17.0)
MCH: 29.5 pg (ref 26.0–34.0)
MCHC: 35 g/dL (ref 30.0–36.0)
MCV: 84.2 fL (ref 80.0–100.0)
Platelets: 227 10*3/uL (ref 150–400)
RBC: 4.82 MIL/uL (ref 4.22–5.81)
RDW: 12.9 % (ref 11.5–15.5)
WBC: 6.9 10*3/uL (ref 4.0–10.5)
nRBC: 0 % (ref 0.0–0.2)

## 2021-05-10 LAB — LIPASE, BLOOD: Lipase: 38 U/L (ref 11–51)

## 2021-05-10 MED ORDER — HYDROCODONE-ACETAMINOPHEN 5-325 MG PO TABS: 2.0000 | ORAL_TABLET | Freq: Four times a day (QID) | ORAL | 0 refills | Status: DC | PRN

## 2021-05-10 MED ORDER — MORPHINE SULFATE (PF) 4 MG/ML IV SOLN
4.0000 mg | Freq: Once | INTRAVENOUS | Status: AC
  Administered 2021-05-10: 4 mg via INTRAVENOUS
  Filled 2021-05-10: qty 1

## 2021-05-10 MED ORDER — ONDANSETRON HCL 4 MG/2ML IJ SOLN
4.0000 mg | INTRAMUSCULAR | Status: AC
  Administered 2021-05-10: 4 mg via INTRAVENOUS
  Filled 2021-05-10: qty 2

## 2021-05-10 MED ORDER — IOHEXOL 300 MG/ML  SOLN
100.0000 mL | Freq: Once | INTRAMUSCULAR | Status: AC | PRN
  Administered 2021-05-10: 100 mL via INTRAVENOUS

## 2021-05-10 NOTE — ED Triage Notes (Signed)
Pt with right lower quadrant pain for 3 days. Pt ambulatory without difficulty, no vomiting, no known fever.

## 2021-05-10 NOTE — ED Notes (Signed)
Pt to ct 

## 2021-05-10 NOTE — ED Notes (Signed)
Discharge intructions reviewed with pt . Pt calm , collective , denied pain or sob  

## 2021-05-10 NOTE — ED Provider Notes (Signed)
[facility] Emergency Department Provider Note  ____________________________________________   Event Date/Time   First MD Initiated Contact with Patient 05/10/21 [phone]     (approximate)  I have reviewed the triage vital signs and the nursing notes.   HISTORY  Chief Complaint Abdominal Pain    HPI Thomas Blackburn is a 55 y.o. male who is a patient at the [place] and who had a ventral hernia repair surgery with mesh placement about 8 months ago.  He presents tonight for evaluation of about 3 days of gradually worsening right lower quadrant abdominal pain.  He said that now it hurts anywhere in his abdomen and makes the right side hurt.  He has not had any other symptoms.  He specifically denies fever, sore throat, chest pain, shortness of breath, nausea, vomiting, diarrhea, and constipation.  He said that the pain is sharp and aching.  Nothing in particular makes it better and moving around and pushing on his abdomen makes it worse.     Past Medical History:  Diagnosis Date   Chronic back pain    IBS (irritable bowel syndrome)    Migraine     There are no problems to display for this patient.   No past surgical history on file.  Prior to Admission medications   Medication Sig Start Date End Date Taking? Authorizing Provider  HYDROcodone-acetaminophen (NORCO/VICODIN) 5-325 MG tablet Take 2 tablets by mouth every 6 (six) hours as needed for moderate pain or severe pain. 05/10/21  Yes Loleta Rose, MD  aspirin EC 81 MG tablet Take 81 mg by mouth daily.    [provider]  methocarbamol (ROBAXIN) 500 MG tablet Take 500 mg by mouth 4 (four) times daily.    [provider]  pantoprazole (PROTONIX) 20 MG tablet Take 20 mg by mouth daily.    [provider]  SUMAtriptan (IMITREX) 100 MG tablet Take 100 mg by mouth every 2 (two) hours as needed for migraine. May repeat in 2 hours if headache persists or recurs.    [provider]     Allergies Wasp venom protein, Tape, and Wheat bran  No family history on file.  Social History Social History   Tobacco Use   Smoking status: Never   Smokeless tobacco: Never  Substance Use Topics   Alcohol use: Not Currently   Drug use: Never    Review of Systems Constitutional: No fever/chills Eyes: No visual changes. ENT: No sore throat. Cardiovascular: Denies chest pain. Respiratory: Denies shortness of breath. Gastrointestinal: Right lower quadrant abdominal pain worsening over the last 3 days as described above.  Denies nausea, vomiting, and diarrhea Genitourinary: Negative for dysuria. Musculoskeletal: Negative for neck pain.  Negative for back pain. Integumentary: Negative for rash. Neurological: Negative for headaches, focal weakness or numbness.   ____________________________________________   PHYSICAL EXAM:  VITAL SIGNS: ED Triage Vitals  Enc Vitals Group     BP 05/10/21 0536 (!) 141/97     Pulse Rate 05/10/21 0536 72     Resp 05/10/21 0536 18     Temp 05/10/21 0536 98.4 F (36.9 C)     Temp Source 05/10/21 0536 Oral     SpO2 05/10/21 0536 97 %     Weight 05/10/21 0532 96.6 kg (213 lb)     Height 05/10/21 0532 1.778 m (5\' 10" )     Head Circumference --      Peak Flow --      Pain Score 05/10/21 0532 10  Pain Loc --      Pain Edu? --      Excl. in GC? --     Constitutional: Alert and oriented.  Patient appears to be comfortable and in no distress until the abdominal exam. Eyes: Conjunctivae are normal.  Head: Atraumatic. Nose: No congestion/rhinnorhea. Mouth/Throat: Patient is wearing a mask. Neck: No stridor.  No meningeal signs.   Cardiovascular: Normal rate, regular rhythm. Good peripheral circulation. Respiratory: Normal respiratory effort.  No retractions. Gastrointestinal: Soft and nondistended.  Tender to palpation diffusely but with localized peritonitis on the right lower quadrant and positive Rovsing sign. Musculoskeletal: No  lower extremity tenderness nor edema. No gross deformities of extremities. Neurologic:  Normal speech and language. No gross focal neurologic deficits are appreciated.  Skin:  Skin is warm, dry and intact. Psychiatric: Mood and affect are normal. Speech and behavior are normal.  ____________________________________________   LABS (all labs ordered are listed, but only abnormal results are displayed)  Labs Reviewed  COMPREHENSIVE METABOLIC PANEL - Abnormal; Notable for the following components:      Result Value   Glucose, Bld 115 (*)    Total Bilirubin 1.4 (*)    All other components within normal limits  URINALYSIS, COMPLETE (UACMP) WITH MICROSCOPIC - Abnormal; Notable for the following components:   Color, Urine YELLOW (*)    APPearance CLEAR (*)    Hgb urine dipstick MODERATE (*)    All other components within normal limits  CBC  LIPASE, BLOOD   ____________________________________________   RADIOLOGY I, Loleta Rose, personally viewed and evaluated these images (plain radiographs) as part of my medical decision making, as well as reviewing the written report by the radiologist.  ED MD interpretation: Negative for appendicitis, positive for mesenteric panniculitis.  Official radiology report(s): CT ABDOMEN PELVIS W CONTRAST  Result Date: 05/10/2021 CLINICAL DATA:  Right lower quadrant pain for 3 days EXAM: CT ABDOMEN AND PELVIS WITH CONTRAST TECHNIQUE: Multidetector CT imaging of the abdomen and pelvis was performed using the standard protocol following bolus administration of intravenous contrast. CONTRAST:  OMNIPAQUE IOHEXOL 300 MG/ML  SOLN COMPARISON:  10/16/2020 FINDINGS: Lower chest:  No contributory findings. Hepatobiliary: No focal liver abnormality.No evidence of biliary obstruction or stone. Pancreas: Unremarkable. Spleen: Unremarkable. Adrenals/Urinary Tract: Negative adrenals. No hydronephrosis or stone. Unremarkable bladder. Stomach/Bowel:  No obstruction. No  appendicitis. Vascular/Lymphatic: No acute vascular abnormality. Fat stranding at the root of the small bowel mesentery without mass or adenopathy. Reproductive:No pathologic findings. Other: No ascites or pneumoperitoneum. Bilateral inguinal hernia repair with mesh/plug. Musculoskeletal: No acute abnormalities. IMPRESSION: 1. Negative for appendicitis. 2. Findings of mesenteric panniculitis, usually chronic and incidental. 3. Atheromatous calcification of the aorta. Electronically Signed   By: Marnee Spring M.D.   On: 05/10/2021 07:11    ____________________________________________   PROCEDURES   Procedure(s) performed (including Critical Care):  Procedures   ____________________________________________   INITIAL IMPRESSION / MDM / ASSESSMENT AND PLAN / ED COURSE  As part of my medical decision making, I reviewed the following data within the electronic MEDICAL RECORD NUMBER Nursing notes reviewed and incorporated, Labs reviewed , Notes from prior ED visits, and [place] Controlled Substance Database   Differential diagnosis includes, but is not limited to, appendicitis, biliary colic, renal/ureteral colic.  Presentation is symptoms are most consistent with appendicitis.  His comprehensive metabolic panel was normal and his CBC is normal.  Urinalysis has moderate hemoglobin which raises the suspicion for ureteral colic, but given the location and duration of the pain  my greatest suspicion is for appendicitis.  Morphine 4 mg IV, Zofran 4 mg IV, and CT scan of the abdomen pelvis with IV contrast.  I will then reassess.  Patient understands and agrees with the plan.       Clinical Course as of 05/10/21 0801  Mon May 10, 2021  8413 CT ABDOMEN PELVIS W CONTRAST CT scan does not demonstrate any acute abnormality.  The symptoms may be the result of mesenteric panniculitis.  The patient's labs are all essentially within normal limits other than a very slightly elevated T.  bili.  Pain is better  controlled at this time.  He is comfortable with the plan for pain medicine including anti-inflammatories and a short course of Norco and he will follow-up with the surgeon with whom he had his hernia surgery about 8 months ago.  I gave my usual and customary return precautions. [CF]    Clinical Course User Index [CF] Loleta Rose, MD     ____________________________________________  FINAL CLINICAL IMPRESSION(S) / ED DIAGNOSES  Final diagnoses:  Generalized abdominal pain  Mesenteric panniculitis (HCC)     MEDICATIONS GIVEN DURING THIS VISIT:  Medications  morphine 4 MG/ML injection 4 mg (4 mg Intravenous Given 05/10/21 0641)  ondansetron (ZOFRAN) injection 4 mg (4 mg Intravenous Given 05/10/21 0640)  iohexol (OMNIPAQUE) 300 MG/ML solution 100 mL (100 mLs Intravenous Contrast Given 05/10/21 2440)     ED Discharge Orders          Ordered    HYDROcodone-acetaminophen (NORCO/VICODIN) 5-325 MG tablet  Every 6 hours PRN        05/10/21 0737             Note:  This document was prepared using Dragon voice recognition software and may include unintentional dictation errors.   Loleta Rose, MD 05/10/21 [phone]

## 2021-05-10 NOTE — Discharge Instructions (Addendum)
As we discussed, your evaluation was generally reassuring.  You do not have appendicitis or diverticulitis and your hernia repair looks good.  You have a condition called mesenteric panniculitis which could be causing your pain.  Try taking ibuprofen 600 mg 3 times a day with meals for at least the next week.  You can also use Tylenol according to the label instructions.  We recommend that you follow-up either with your regular doctor at the [facility] or with the surgeon that performed your hernia surgery back in October.  Take Norco as prescribed for severe pain. Do not drink alcohol, drive or participate in any other potentially dangerous activities while taking this medication as it may make you sleepy. Do not take this medication with any other sedating medications, either prescription or over-the-counter. If you were prescribed Percocet or Vicodin, do not take these with acetaminophen (Tylenol) as it is already contained within these medications.   This medication is an opiate (or narcotic) pain medication and can be habit forming.  Use it as little as possible to achieve adequate pain control.  Do not use or use it with extreme caution if you have a history of opiate abuse or dependence.  If you are on a pain contract with your primary care doctor or a pain specialist, be sure to let them know you were prescribed this medication today from the [facility] Emergency Department.  This medication is intended for your use only - do not give any to anyone else and keep it in a secure place where nobody else, especially children, have access to it.  It will also cause or worsen constipation, so you may want to consider taking an over-the-counter stool softener while you are taking this medication.  Return to the nearest emergency department if you develop new or worsening symptoms that concern you.

## 2022-05-21 ENCOUNTER — Emergency Department
Admission: EM | Admit: 2022-05-21 | Discharge: 2022-05-21 | Disposition: A | Discharge status: Home / Self Care | Payer: No Typology Code available for payment source | Attending: Emergency Medicine | Admitting: Emergency Medicine

## 2022-05-21 ENCOUNTER — Encounter: Payer: Self-pay | Admitting: Emergency Medicine

## 2022-05-21 ENCOUNTER — Other Ambulatory Visit: Payer: Self-pay

## 2022-05-21 ENCOUNTER — Emergency Department: Payer: No Typology Code available for payment source

## 2022-05-21 DIAGNOSIS — M546 Pain in thoracic spine: Secondary | ICD-10-CM | POA: Diagnosis present

## 2022-05-21 DIAGNOSIS — I2510 Atherosclerotic heart disease of native coronary artery without angina pectoris: Secondary | ICD-10-CM | POA: Insufficient documentation

## 2022-05-21 HISTORY — DX: Atherosclerotic heart disease of native coronary artery without angina pectoris: I25.10

## 2022-05-21 HISTORY — DX: Personal history of other diseases of the musculoskeletal system and connective tissue: Z87.39

## 2022-05-21 MED ORDER — METHOCARBAMOL 500 MG PO TABS: ORAL_TABLET | ORAL | 0 refills | Status: AC

## 2022-05-21 MED ORDER — PREDNISONE 10 MG PO TABS: ORAL_TABLET | ORAL | 0 refills | Status: AC

## 2022-05-21 MED ORDER — OXYCODONE HCL 5 MG PO TABS: 5.0000 mg | ORAL_TABLET | Freq: Four times a day (QID) | ORAL | 0 refills | Status: AC | PRN

## 2022-05-21 NOTE — ED Triage Notes (Signed)
C/O mid back pain and neck pain x 2 week.  Pain worse when turning head.  Has taken Ibuprofen for pain, with no improvement.  AAOx3.  Skin warm and dry.  NAD

## 2022-06-02 ENCOUNTER — Emergency Department
Admission: EM | Admit: 2022-06-02 | Discharge: 2022-06-02 | Disposition: A | Discharge status: Home / Self Care | Payer: No Typology Code available for payment source | Attending: Emergency Medicine | Admitting: Emergency Medicine

## 2022-06-02 ENCOUNTER — Other Ambulatory Visit: Payer: Self-pay

## 2022-06-02 DIAGNOSIS — S161XXA Strain of muscle, fascia and tendon at neck level, initial encounter: Secondary | ICD-10-CM | POA: Diagnosis not present

## 2022-06-02 DIAGNOSIS — S199XXA Unspecified injury of neck, initial encounter: Secondary | ICD-10-CM | POA: Diagnosis present

## 2022-06-02 DIAGNOSIS — X58XXXA Exposure to other specified factors, initial encounter: Secondary | ICD-10-CM | POA: Diagnosis not present

## 2022-06-02 DIAGNOSIS — I2510 Atherosclerotic heart disease of native coronary artery without angina pectoris: Secondary | ICD-10-CM | POA: Insufficient documentation

## 2022-06-02 MED ORDER — KETOROLAC TROMETHAMINE 10 MG PO TABS: 10.0000 mg | ORAL_TABLET | Freq: Four times a day (QID) | ORAL | 0 refills | Status: AC | PRN

## 2022-06-02 MED ORDER — LIDOCAINE 5 % EX PTCH: 1.0000 | MEDICATED_PATCH | Freq: Two times a day (BID) | CUTANEOUS | 0 refills | Status: AC

## 2022-06-02 MED ORDER — CYCLOBENZAPRINE HCL 5 MG PO TABS: 5.0000 mg | ORAL_TABLET | Freq: Three times a day (TID) | ORAL | 0 refills | Status: AC | PRN

## 2022-06-02 NOTE — ED Provider Notes (Signed)
[facility] Provider Note    Event Date/Time   First MD Initiated Contact with Patient 06/02/22 2138     (approximate)   History   Chief Complaint Neck Pain   HPI  Thomas Blackburn is a 56 y.o. male with past medical history of CAD, migraines, and chronic back pain who presents to the ED complaining of neck pain.  Patient reports that for about the past week he has been dealing with pain in both sides of his neck.  He describes it as an ache that is worse when he twists his neck to either side.  He denies any fevers or neck stiffness, has not had any numbness or weakness in his extremities.  He states he has taken a course of steroids and muscle relaxer without relief in his symptoms.  He denies any pain in his chest or difficulty breathing.  He has dealt with similar issues in his neck previously, but it does not typically last this long.     Physical Exam   Triage Vital Signs: ED Triage Vitals  Enc Vitals Group     BP 06/02/22 1852 (!) 148/95     Pulse Rate 06/02/22 1852 66     Resp 06/02/22 1852 18     Temp 06/02/22 1852 98 F (36.7 C)     Temp Source 06/02/22 1852 Oral     SpO2 06/02/22 1852 96 %     Weight 06/02/22 1853 216 lb (98 kg)     Height 06/02/22 1853 5\' 10"  (1.778 m)     Head Circumference --      Peak Flow --      Pain Score 06/02/22 1852 8     Pain Loc --      Pain Edu? --      Excl. in [place]? --     Most recent vital signs: Vitals:   06/02/22 1852 06/02/22 2208  BP: (!) 148/95 (!) 143/93  Pulse: 66 68  Resp: 18 18  Temp: 98 F (36.7 C) 98.2 F (36.8 C)  SpO2: 96% 99%    Constitutional: Alert and oriented. Eyes: Conjunctivae are normal. Head: Atraumatic. Nose: No congestion/rhinnorhea. Mouth/Throat: Mucous membranes are moist.  Neck: No midline cervical spine tenderness to palpation noted, tenderness noted to bilateral paraspinal areas and trapezius muscles. Cardiovascular: Normal rate, regular rhythm. Grossly normal  heart sounds.  2+ radial pulses bilaterally. Respiratory: Normal respiratory effort.  No retractions. Lungs CTAB. Gastrointestinal: Soft and nontender. No distention. Musculoskeletal: No lower extremity tenderness nor edema.  Neurologic:  Normal speech and language. No gross focal neurologic deficits are appreciated.    ED Results / Procedures / Treatments   Labs (all labs ordered are listed, but only abnormal results are displayed) Labs Reviewed - No data to display   PROCEDURES:  Critical Care performed: No  Procedures   MEDICATIONS ORDERED IN ED: Medications - No data to display   IMPRESSION / MDM / ASSESSMENT AND PLAN / ED COURSE  I reviewed the triage vital signs and the nursing notes.                              56 y.o. male with past medical history of CAD, migraines, and chronic back pain who presents to the ED complaining of 1 week of pain to both sides of his neck that is worse when he twists his head to either side.  Patient's presentation is  most consistent with acute, uncomplicated illness.  Differential diagnosis includes, but is not limited to, cervical strain, cervical radiculopathy, meningitis.  Patient well-appearing and in no acute distress, vital signs are unremarkable.  Doubt cardiac etiology given pain is exacerbated by movement of his neck and is reproducible with palpation of his paraspinal areas and trapezius muscles.  No fevers or stiffness to suggest meningitis and no neurologic deficits to suggest other intracranial process or arterial dissection.  We will treat symptomatically with Toradol, Lidoderm patches, and muscle relaxants.  Patient is appropriate for discharge home with PCP follow-up, was counseled to return to the ED for new or worsening symptoms, patient agrees with plan.      FINAL CLINICAL IMPRESSION(S) / ED DIAGNOSES   Final diagnoses:  Acute strain of neck muscle, initial encounter     Rx / DC Orders   ED Discharge Orders           Ordered    ketorolac (TORADOL) 10 MG tablet  Every 6 hours PRN        06/02/22 2202    lidocaine (LIDODERM) 5 %  Every 12 hours        06/02/22 2202    cyclobenzaprine (FLEXERIL) 5 MG tablet  3 times daily PRN        06/02/22 2202             Note:  This document was prepared using Dragon voice recognition software and may include unintentional dictation errors.   Chesley Noon, MD 06/03/22 [phone]

## 2022-06-02 NOTE — ED Provider Triage Note (Signed)
Emergency Medicine Provider Triage Evaluation Note  Thomas Blackburn , a 56 y.o. male  was evaluated in triage.  Pt complains of neck pain, increase with.  Review of Systems  Positive: Neck pain Negative: Fever chills  Physical Exam  BP (!) 148/95 (BP Location: Left Arm)   Pulse 66   Temp 98 F (36.7 C) (Oral)   Resp 18   Ht 5\' 10"  (1.778 m)   Wt 98 kg   SpO2 96%   BMI 30.99 kg/m  Gen:   Awake, no distress   Resp:  Normal effort  MSK:   Moves extremities without difficulty  Other:    Medical Decision Making  Medically screening exam initiated at 6:55 PM.  Appropriate orders placed.  Amarius Toto was informed that the remainder of the evaluation will be completed by another provider, this initial triage assessment does not replace that evaluation, and the importance of remaining in the ED until their evaluation is complete.  Patient has his MRI readings with him   Leanord Asal, PA-C 06/02/22 1855

## 2022-06-02 NOTE — ED Triage Notes (Signed)
Pt states that he was seen here a week ago for neck pain and it has not gotten any better so he went to the [place] and was sent back here- pt has been having this pain for a total of 3 weeks

## 2023-02-19 ENCOUNTER — Emergency Department: Payer: No Typology Code available for payment source

## 2023-02-19 ENCOUNTER — Emergency Department
Admission: EM | Admit: 2023-02-19 | Discharge: 2023-02-19 | Disposition: A | Discharge status: Home / Self Care | Payer: No Typology Code available for payment source | Attending: Emergency Medicine | Admitting: Emergency Medicine

## 2023-02-19 ENCOUNTER — Other Ambulatory Visit: Payer: Self-pay

## 2023-02-19 DIAGNOSIS — M79602 Pain in left arm: Secondary | ICD-10-CM | POA: Diagnosis present

## 2023-02-19 LAB — CK: Total CK: 54 U/L (ref 49–397)

## 2023-02-19 LAB — TROPONIN I (HIGH SENSITIVITY)
Troponin I (High Sensitivity): 2 ng/L (ref <18)
Troponin I (High Sensitivity): <2 ng/L (ref <18)

## 2023-02-19 LAB — CBC
HCT: 42.8 % (ref 39.0–52.0)
Hemoglobin: 14.4 g/dL (ref 13.0–17.0)
MCH: 29.4 pg (ref 26.0–34.0)
MCHC: 33.6 g/dL (ref 30.0–36.0)
MCV: 87.3 fL (ref 80.0–100.0)
Platelets: 216 10*3/uL (ref 150–400)
RBC: 4.9 MIL/uL (ref 4.22–5.81)
RDW: 12.9 % (ref 11.5–15.5)
WBC: 6.2 10*3/uL (ref 4.0–10.5)
nRBC: 0 % (ref 0.0–0.2)

## 2023-02-19 LAB — BASIC METABOLIC PANEL
Anion gap: 8 (ref 5–15)
BUN: 12 mg/dL (ref 6–20)
CO2: 22 mmol/L (ref 22–32)
Calcium: 8.4 mg/dL — ABNORMAL LOW (ref 8.9–10.3)
Chloride: 104 mmol/L (ref 98–111)
Creatinine, Ser: 1.02 mg/dL (ref 0.61–1.24)
GFR, Estimated: >60 mL/min (ref >60)
Glucose, Bld: 126 mg/dL — ABNORMAL HIGH (ref 70–99)
Potassium: 3.3 mmol/L — ABNORMAL LOW (ref 3.5–5.1)
Sodium: 134 mmol/L — ABNORMAL LOW (ref 135–145)

## 2023-02-19 NOTE — ED Triage Notes (Signed)
Pt to ED via POV from home. Pt reports received tetanus shot on Friday and since has been having left sided CP/Arm pain that has been constant. Pt report pain on palpation 10/10.

## 2023-02-19 NOTE — ED Provider Notes (Signed)
[facility] Provider Note    Event Date/Time   First MD Initiated Contact with Patient 02/19/23 1515     (approximate)   History   Left arm pain   HPI  Thomas Blackburn is a 57 y.o. male  who presents to the emergency department today because of concern for left arm pain.  The patient had a tetanus shot performed 2 days ago.  Started noticing pain and tingling to his left forearm.  It now extends from his left forearm up to his left upper arm and some to his left chest.  It is worse if he tries to straighten out his left arm.  He also noticed increased pain with palpation.  Patient denies similar symptoms in the past.      Physical Exam   Triage Vital Signs: ED Triage Vitals  Enc Vitals Group     BP 02/19/23 1432 (!) 157/88     Pulse Rate 02/19/23 1432 88     Resp 02/19/23 1432 18     Temp 02/19/23 1432 98 F (36.7 C)     Temp src --      SpO2 02/19/23 1432 99 %     Weight 02/19/23 1431 214 lb (97.1 kg)     Height 02/19/23 1431 5\' 10"  (1.778 m)     Head Circumference --      Peak Flow --      Pain Score 02/19/23 1431 10     Pain Loc --      Pain Edu? --      Excl. in [place]? --     Most recent vital signs: Vitals:   02/19/23 1432  BP: (!) 157/88  Pulse: 88  Resp: 18  Temp: 98 F (36.7 C)  SpO2: 99%   General: Awake, alert, oriented. CV:  Good peripheral perfusion. Regular rate and rhythm. Resp:  Normal effort. Lungs clear. Abd:  No distention.  Other:  Left arm without swelling or deformity. Some tenderness to palpation of the AC fossa. Radial pulse 2+.    ED Results / Procedures / Treatments   Labs (all labs ordered are listed, but only abnormal results are displayed) Labs Reviewed  BASIC METABOLIC PANEL - Abnormal; Notable for the following components:      Result Value   Sodium 134 (*)    Potassium 3.3 (*)    Glucose, Bld 126 (*)    Calcium 8.4 (*)    All other components within normal limits  CBC  CK  TROPONIN I (HIGH  SENSITIVITY)  TROPONIN I (HIGH SENSITIVITY)     EKG  I, Nance Pear, attending physician, personally viewed and interpreted this EKG  EKG Time: 1428 Rate: 72 Rhythm: normal sinus rhythm Axis: normal Intervals: qtc 413 QRS: narrow ST changes: no st elevation Impression: normal ekg   RADIOLOGY I independently interpreted and visualized the CXR. My interpretation: No pneumonia. No pneumothorax Radiology interpretation:  IMPRESSION:  Normal exam.   I independently interpreted and visualized the US venous left upper arm. My interpretation: No clot Radiology interpretation:  IMPRESSION:  No evidence of DVT within the left upper extremity.      PROCEDURES:  Critical Care performed: No    MEDICATIONS ORDERED IN ED: Medications - No data to display   IMPRESSION / MDM / [place] / ED COURSE  I reviewed the triage vital signs and the nursing notes.  Differential diagnosis includes, but is not limited to, blood clot, myositis, reaction to vaccine, MSK pain  Patient's presentation is most consistent with acute presentation with potential threat to life or bodily function.   Patient presented to the emergency department today because of concerns for left arm pain 2 days after receiving a tetanus shot.  On exam no obvious abnormality to the arm.  Radial pulse is 2+.  No swelling or erythema.  Strength 5 out of 5 in bilateral upper extremities.  Did obtain an ultrasound which did not show any evidence of DVT.  Additionally obtained blood work which did not show any concerning leukocytosis or elevated CK level.  This point somewhat unclear etiology however given reassuring workup I do think is reasonable for patient be discharged home.  Did discuss with patient portance of following up with primary care.     FINAL CLINICAL IMPRESSION(S) / ED DIAGNOSES   Final diagnoses:  Left arm pain     Note:  This document was prepared using  Dragon voice recognition software and may include unintentional dictation errors.    Nance Pear, MD 02/19/23 [phone]

## 2023-02-19 NOTE — Discharge Instructions (Signed)
Please seek medical attention for any high fevers, chest pain, shortness of breath, change in behavior, persistent vomiting, bloody stool or any other new or concerning symptoms.  

## 2024-12-06 ENCOUNTER — Ambulatory Visit: Admitting: Family Medicine

## 2024-12-06 ENCOUNTER — Encounter: Payer: Self-pay | Admitting: Family Medicine

## 2024-12-06 VITALS — BP 118/82 | HR 70 | Temp 98.0°F | Ht 70.0 in | Wt 212.4 lb

## 2024-12-06 DIAGNOSIS — R319 Hematuria, unspecified: Secondary | ICD-10-CM

## 2024-12-06 DIAGNOSIS — R600 Localized edema: Secondary | ICD-10-CM | POA: Diagnosis not present

## 2024-12-06 DIAGNOSIS — Z8673 Personal history of transient ischemic attack (TIA), and cerebral infarction without residual deficits: Secondary | ICD-10-CM

## 2024-12-06 DIAGNOSIS — F4310 Post-traumatic stress disorder, unspecified: Secondary | ICD-10-CM | POA: Diagnosis not present

## 2024-12-06 DIAGNOSIS — M539 Dorsopathy, unspecified: Secondary | ICD-10-CM | POA: Diagnosis not present

## 2024-12-06 DIAGNOSIS — L309 Dermatitis, unspecified: Secondary | ICD-10-CM

## 2024-12-06 DIAGNOSIS — I10 Essential (primary) hypertension: Secondary | ICD-10-CM | POA: Diagnosis not present

## 2024-12-06 DIAGNOSIS — Z7689 Persons encountering health services in other specified circumstances: Secondary | ICD-10-CM

## 2024-12-06 DIAGNOSIS — G4700 Insomnia, unspecified: Secondary | ICD-10-CM | POA: Diagnosis not present

## 2024-12-06 DIAGNOSIS — G43709 Chronic migraine without aura, not intractable, without status migrainosus: Secondary | ICD-10-CM | POA: Diagnosis not present
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
## 2025-01-20 ENCOUNTER — Ambulatory Visit: Admitting: Family Medicine

## 2025-02-10 ENCOUNTER — Ambulatory Visit: Admitting: Family Medicine
